# Patient Record
Sex: Female | Born: 1940 | Race: Black or African American | Hispanic: No | Smoking: Former smoker
Health system: Southern US, Community
[De-identification: ages and names within clinical notes are randomized; demographics above are authoritative.]

## PROBLEM LIST (undated history)

## (undated) DIAGNOSIS — F32A Depression, unspecified: Secondary | ICD-10-CM

## (undated) DIAGNOSIS — G43909 Migraine, unspecified, not intractable, without status migrainosus: Secondary | ICD-10-CM

## (undated) DIAGNOSIS — I10 Essential (primary) hypertension: Secondary | ICD-10-CM

## (undated) DIAGNOSIS — F329 Major depressive disorder, single episode, unspecified: Secondary | ICD-10-CM

## (undated) DIAGNOSIS — T7840XA Allergy, unspecified, initial encounter: Secondary | ICD-10-CM

## (undated) DIAGNOSIS — K219 Gastro-esophageal reflux disease without esophagitis: Secondary | ICD-10-CM

## (undated) DIAGNOSIS — M8580 Other specified disorders of bone density and structure, unspecified site: Secondary | ICD-10-CM

## (undated) HISTORY — DX: Allergy, unspecified, initial encounter: T78.40XA

## (undated) HISTORY — PX: ANKLE SURGERY: SHX546

## (undated) HISTORY — DX: Other specified disorders of bone density and structure, unspecified site: M85.80

## (undated) HISTORY — DX: Gastro-esophageal reflux disease without esophagitis: K21.9

## (undated) HISTORY — PX: COLON SURGERY: SHX602

## (undated) HISTORY — DX: Essential (primary) hypertension: I10

## (undated) HISTORY — PX: LIPOMA EXCISION: SHX5283

## (undated) HISTORY — PX: CHOLECYSTECTOMY: SHX55

## (undated) HISTORY — DX: Migraine, unspecified, not intractable, without status migrainosus: G43.909

## (undated) HISTORY — PX: HEMORRHOID SURGERY: SHX153

## (undated) HISTORY — PX: BREAST SURGERY: SHX581

## (undated) HISTORY — PX: BUNIONECTOMY: SHX129

## (undated) HISTORY — DX: Major depressive disorder, single episode, unspecified: F32.9

## (undated) HISTORY — DX: Depression, unspecified: F32.A

---

## 1977-08-21 HISTORY — PX: VAGINAL HYSTERECTOMY: SUR661

## 1998-03-30 ENCOUNTER — Ambulatory Visit (HOSPITAL_COMMUNITY): Admission: RE | Admit: 1998-03-30 | Discharge: 1998-03-30 | Payer: Self-pay | Admitting: *Deleted

## 1999-03-21 ENCOUNTER — Other Ambulatory Visit: Admission: RE | Admit: 1999-03-21 | Discharge: 1999-03-21 | Payer: Self-pay | Admitting: *Deleted

## 1999-07-27 ENCOUNTER — Ambulatory Visit (HOSPITAL_COMMUNITY): Admission: RE | Admit: 1999-07-27 | Discharge: 1999-07-27 | Payer: Self-pay | Admitting: Orthopaedic Surgery

## 1999-09-24 ENCOUNTER — Encounter: Payer: Self-pay | Admitting: Internal Medicine

## 1999-09-25 ENCOUNTER — Inpatient Hospital Stay (HOSPITAL_COMMUNITY): Admission: EM | Admit: 1999-09-25 | Discharge: 1999-10-03 | Payer: Self-pay | Admitting: Surgery

## 1999-09-25 ENCOUNTER — Encounter: Payer: Self-pay | Admitting: Internal Medicine

## 1999-09-28 ENCOUNTER — Encounter: Payer: Self-pay | Admitting: Surgery

## 1999-09-29 ENCOUNTER — Encounter: Payer: Self-pay | Admitting: Surgery

## 1999-10-11 ENCOUNTER — Ambulatory Visit (HOSPITAL_COMMUNITY): Admission: RE | Admit: 1999-10-11 | Discharge: 1999-10-11 | Payer: Self-pay

## 2000-05-29 ENCOUNTER — Other Ambulatory Visit: Admission: RE | Admit: 2000-05-29 | Discharge: 2000-05-29 | Payer: Self-pay | Admitting: *Deleted

## 2000-09-27 ENCOUNTER — Encounter: Payer: Self-pay | Admitting: *Deleted

## 2000-09-28 ENCOUNTER — Inpatient Hospital Stay (HOSPITAL_COMMUNITY): Admission: RE | Admit: 2000-09-28 | Discharge: 2000-09-30 | Payer: Self-pay | Admitting: *Deleted

## 2001-07-22 ENCOUNTER — Other Ambulatory Visit: Admission: RE | Admit: 2001-07-22 | Discharge: 2001-07-22 | Payer: Self-pay | Admitting: *Deleted

## 2001-12-13 ENCOUNTER — Encounter: Payer: Self-pay | Admitting: Interventional Cardiology

## 2001-12-13 ENCOUNTER — Inpatient Hospital Stay (HOSPITAL_COMMUNITY): Admission: EM | Admit: 2001-12-13 | Discharge: 2001-12-17 | Payer: Self-pay | Admitting: Emergency Medicine

## 2001-12-13 ENCOUNTER — Encounter: Payer: Self-pay | Admitting: Emergency Medicine

## 2001-12-16 ENCOUNTER — Encounter: Payer: Self-pay | Admitting: Cardiology

## 2001-12-30 ENCOUNTER — Ambulatory Visit (HOSPITAL_COMMUNITY): Admission: RE | Admit: 2001-12-30 | Discharge: 2001-12-30 | Payer: Self-pay | Admitting: Gastroenterology

## 2001-12-30 ENCOUNTER — Encounter: Payer: Self-pay | Admitting: Gastroenterology

## 2002-01-01 ENCOUNTER — Ambulatory Visit (HOSPITAL_COMMUNITY): Admission: RE | Admit: 2002-01-01 | Discharge: 2002-01-01 | Payer: Self-pay | Admitting: Gastroenterology

## 2002-01-16 ENCOUNTER — Encounter: Payer: Self-pay | Admitting: Surgery

## 2002-01-16 ENCOUNTER — Encounter: Admission: RE | Admit: 2002-01-16 | Discharge: 2002-01-16 | Payer: Self-pay | Admitting: Surgery

## 2002-01-16 ENCOUNTER — Encounter (INDEPENDENT_AMBULATORY_CARE_PROVIDER_SITE_OTHER): Payer: Self-pay | Admitting: *Deleted

## 2002-01-22 ENCOUNTER — Encounter: Payer: Self-pay | Admitting: Surgery

## 2002-01-27 ENCOUNTER — Inpatient Hospital Stay (HOSPITAL_COMMUNITY): Admission: RE | Admit: 2002-01-27 | Discharge: 2002-02-04 | Payer: Self-pay | Admitting: Surgery

## 2002-02-17 ENCOUNTER — Ambulatory Visit (HOSPITAL_COMMUNITY): Admission: RE | Admit: 2002-02-17 | Discharge: 2002-02-17 | Payer: Self-pay | Admitting: Surgery

## 2002-02-18 ENCOUNTER — Ambulatory Visit (HOSPITAL_BASED_OUTPATIENT_CLINIC_OR_DEPARTMENT_OTHER): Admission: RE | Admit: 2002-02-18 | Discharge: 2002-02-18 | Payer: Self-pay | Admitting: Surgery

## 2002-02-18 ENCOUNTER — Encounter: Admission: RE | Admit: 2002-02-18 | Discharge: 2002-02-18 | Payer: Self-pay | Admitting: Surgery

## 2002-02-18 ENCOUNTER — Encounter: Payer: Self-pay | Admitting: Surgery

## 2002-02-18 ENCOUNTER — Encounter (INDEPENDENT_AMBULATORY_CARE_PROVIDER_SITE_OTHER): Payer: Self-pay | Admitting: *Deleted

## 2002-04-24 ENCOUNTER — Encounter (INDEPENDENT_AMBULATORY_CARE_PROVIDER_SITE_OTHER): Payer: Self-pay | Admitting: Specialist

## 2002-04-24 ENCOUNTER — Ambulatory Visit (HOSPITAL_COMMUNITY): Admission: RE | Admit: 2002-04-24 | Discharge: 2002-04-24 | Payer: Self-pay | Admitting: Gastroenterology

## 2002-10-28 ENCOUNTER — Other Ambulatory Visit: Admission: RE | Admit: 2002-10-28 | Discharge: 2002-10-28 | Payer: Self-pay | Admitting: *Deleted

## 2003-01-19 ENCOUNTER — Encounter: Payer: Self-pay | Admitting: Emergency Medicine

## 2003-01-20 ENCOUNTER — Inpatient Hospital Stay (HOSPITAL_COMMUNITY): Admission: EM | Admit: 2003-01-20 | Discharge: 2003-01-22 | Payer: Self-pay | Admitting: Emergency Medicine

## 2003-01-20 ENCOUNTER — Encounter: Payer: Self-pay | Admitting: Internal Medicine

## 2003-01-21 ENCOUNTER — Encounter: Payer: Self-pay | Admitting: Internal Medicine

## 2003-02-26 ENCOUNTER — Encounter: Payer: Self-pay | Admitting: Surgery

## 2003-02-26 ENCOUNTER — Encounter: Admission: RE | Admit: 2003-02-26 | Discharge: 2003-02-26 | Payer: Self-pay | Admitting: Surgery

## 2003-10-11 ENCOUNTER — Emergency Department (HOSPITAL_COMMUNITY): Admission: EM | Admit: 2003-10-11 | Discharge: 2003-10-11 | Payer: Self-pay | Admitting: Emergency Medicine

## 2004-02-29 ENCOUNTER — Encounter: Admission: RE | Admit: 2004-02-29 | Discharge: 2004-02-29 | Payer: Self-pay | Admitting: Gynecology

## 2004-08-21 HISTORY — PX: BREAST EXCISIONAL BIOPSY: SUR124

## 2004-10-24 ENCOUNTER — Encounter: Admission: RE | Admit: 2004-10-24 | Discharge: 2004-10-24 | Payer: Self-pay | Admitting: Gynecology

## 2004-11-29 ENCOUNTER — Encounter: Admission: RE | Admit: 2004-11-29 | Discharge: 2004-11-29 | Payer: Self-pay | Admitting: Emergency Medicine

## 2005-03-02 ENCOUNTER — Other Ambulatory Visit: Admission: RE | Admit: 2005-03-02 | Discharge: 2005-03-02 | Payer: Self-pay | Admitting: Gynecology

## 2005-04-06 ENCOUNTER — Encounter: Admission: RE | Admit: 2005-04-06 | Discharge: 2005-04-06 | Payer: Self-pay | Admitting: Gynecology

## 2005-05-22 ENCOUNTER — Encounter: Admission: RE | Admit: 2005-05-22 | Discharge: 2005-05-22 | Payer: Self-pay | Admitting: Family Medicine

## 2005-12-28 ENCOUNTER — Encounter: Admission: RE | Admit: 2005-12-28 | Discharge: 2005-12-28 | Payer: Self-pay | Admitting: Gynecology

## 2006-08-17 ENCOUNTER — Ambulatory Visit: Payer: Self-pay | Admitting: Critical Care Medicine

## 2006-08-27 ENCOUNTER — Encounter: Admission: RE | Admit: 2006-08-27 | Discharge: 2006-08-27 | Payer: Self-pay | Admitting: Obstetrics and Gynecology

## 2006-09-05 ENCOUNTER — Ambulatory Visit: Payer: Self-pay | Admitting: Internal Medicine

## 2006-12-31 ENCOUNTER — Encounter: Admission: RE | Admit: 2006-12-31 | Discharge: 2006-12-31 | Payer: Self-pay | Admitting: Emergency Medicine

## 2007-04-12 ENCOUNTER — Ambulatory Visit: Payer: Self-pay | Admitting: Family Medicine

## 2007-06-25 ENCOUNTER — Ambulatory Visit: Payer: Self-pay | Admitting: Family Medicine

## 2007-07-03 ENCOUNTER — Ambulatory Visit: Payer: Self-pay | Admitting: Family Medicine

## 2007-07-11 ENCOUNTER — Ambulatory Visit: Payer: Self-pay | Admitting: Family Medicine

## 2007-07-17 ENCOUNTER — Encounter: Admission: RE | Admit: 2007-07-17 | Discharge: 2007-07-17 | Payer: Self-pay | Admitting: Family Medicine

## 2007-07-22 ENCOUNTER — Ambulatory Visit: Payer: Self-pay | Admitting: Family Medicine

## 2007-09-11 ENCOUNTER — Other Ambulatory Visit: Admission: RE | Admit: 2007-09-11 | Discharge: 2007-09-11 | Payer: Self-pay | Admitting: Gynecology

## 2007-10-07 ENCOUNTER — Ambulatory Visit: Payer: Self-pay | Admitting: Family Medicine

## 2007-12-06 ENCOUNTER — Ambulatory Visit: Payer: Self-pay | Admitting: Family Medicine

## 2008-02-03 ENCOUNTER — Encounter: Admission: RE | Admit: 2008-02-03 | Discharge: 2008-02-03 | Payer: Self-pay | Admitting: Gynecology

## 2008-02-19 ENCOUNTER — Ambulatory Visit: Payer: Self-pay | Admitting: Family Medicine

## 2008-04-23 ENCOUNTER — Ambulatory Visit: Payer: Self-pay | Admitting: Family Medicine

## 2008-05-22 ENCOUNTER — Ambulatory Visit: Payer: Self-pay | Admitting: Family Medicine

## 2008-08-27 ENCOUNTER — Ambulatory Visit: Payer: Self-pay | Admitting: Family Medicine

## 2008-09-04 ENCOUNTER — Encounter: Admission: RE | Admit: 2008-09-04 | Discharge: 2008-09-04 | Payer: Self-pay | Admitting: Family Medicine

## 2008-09-04 ENCOUNTER — Ambulatory Visit: Payer: Self-pay | Admitting: Family Medicine

## 2008-09-17 ENCOUNTER — Encounter: Admission: RE | Admit: 2008-09-17 | Discharge: 2008-09-17 | Payer: Self-pay | Admitting: Orthopedic Surgery

## 2008-10-07 ENCOUNTER — Ambulatory Visit: Payer: Self-pay | Admitting: Family Medicine

## 2008-11-02 ENCOUNTER — Ambulatory Visit: Payer: Self-pay | Admitting: Family Medicine

## 2008-11-10 ENCOUNTER — Ambulatory Visit (HOSPITAL_COMMUNITY): Admission: RE | Admit: 2008-11-10 | Discharge: 2008-11-11 | Payer: Self-pay | Admitting: Orthopedic Surgery

## 2009-02-04 ENCOUNTER — Encounter: Admission: RE | Admit: 2009-02-04 | Discharge: 2009-02-04 | Payer: Self-pay | Admitting: Family Medicine

## 2009-03-04 ENCOUNTER — Ambulatory Visit: Payer: Self-pay | Admitting: Gynecology

## 2009-03-04 ENCOUNTER — Other Ambulatory Visit: Admission: RE | Admit: 2009-03-04 | Discharge: 2009-03-04 | Payer: Self-pay | Admitting: Gynecology

## 2009-03-04 ENCOUNTER — Encounter: Payer: Self-pay | Admitting: Gynecology

## 2009-03-08 ENCOUNTER — Ambulatory Visit: Payer: Self-pay | Admitting: Gynecology

## 2009-03-23 ENCOUNTER — Ambulatory Visit: Payer: Self-pay | Admitting: Gynecology

## 2009-07-01 ENCOUNTER — Ambulatory Visit: Payer: Self-pay | Admitting: Family Medicine

## 2009-09-14 ENCOUNTER — Ambulatory Visit: Payer: Self-pay | Admitting: Family Medicine

## 2009-12-01 ENCOUNTER — Ambulatory Visit: Payer: Self-pay | Admitting: Physician Assistant

## 2010-02-07 ENCOUNTER — Encounter: Admission: RE | Admit: 2010-02-07 | Discharge: 2010-02-07 | Payer: Self-pay | Admitting: Gynecology

## 2010-02-07 LAB — HM MAMMOGRAPHY: HM Mammogram: NEGATIVE

## 2010-03-07 ENCOUNTER — Other Ambulatory Visit: Admission: RE | Admit: 2010-03-07 | Discharge: 2010-03-07 | Payer: Self-pay | Admitting: Gynecology

## 2010-03-07 ENCOUNTER — Ambulatory Visit: Payer: Self-pay | Admitting: Gynecology

## 2010-03-16 ENCOUNTER — Ambulatory Visit: Payer: Self-pay | Admitting: Family Medicine

## 2010-05-30 ENCOUNTER — Ambulatory Visit: Payer: Self-pay | Admitting: Gynecology

## 2010-07-29 ENCOUNTER — Ambulatory Visit: Payer: Self-pay | Admitting: Family Medicine

## 2010-09-10 ENCOUNTER — Encounter: Payer: Self-pay | Admitting: Emergency Medicine

## 2010-09-14 ENCOUNTER — Ambulatory Visit: Admit: 2010-09-14 | Payer: Self-pay | Admitting: Family Medicine

## 2010-10-13 ENCOUNTER — Ambulatory Visit (INDEPENDENT_AMBULATORY_CARE_PROVIDER_SITE_OTHER): Payer: Medicare Other | Admitting: Family Medicine

## 2010-10-13 DIAGNOSIS — K5289 Other specified noninfective gastroenteritis and colitis: Secondary | ICD-10-CM

## 2010-10-13 DIAGNOSIS — J0100 Acute maxillary sinusitis, unspecified: Secondary | ICD-10-CM

## 2010-11-17 ENCOUNTER — Ambulatory Visit (INDEPENDENT_AMBULATORY_CARE_PROVIDER_SITE_OTHER): Payer: Medicare Other | Admitting: Family Medicine

## 2010-11-17 DIAGNOSIS — Z79899 Other long term (current) drug therapy: Secondary | ICD-10-CM

## 2010-11-17 DIAGNOSIS — I10 Essential (primary) hypertension: Secondary | ICD-10-CM

## 2010-12-01 LAB — CBC
HCT: 40.2 % (ref 36.0–46.0)
Hemoglobin: 14 g/dL (ref 12.0–15.0)
MCV: 86.8 fL (ref 78.0–100.0)
Platelets: 230 10*3/uL (ref 150–400)

## 2010-12-01 LAB — BASIC METABOLIC PANEL
Calcium: 10.1 mg/dL (ref 8.4–10.5)
Chloride: 96 mEq/L (ref 96–112)
Creatinine, Ser: 0.68 mg/dL (ref 0.4–1.2)
GFR calc non Af Amer: >60 mL/min (ref >60)
Potassium: 3.8 mEq/L (ref 3.5–5.1)
Sodium: 132 mEq/L — ABNORMAL LOW (ref 135–145)

## 2010-12-01 LAB — GLUCOSE, CAPILLARY
Glucose-Capillary: 103 mg/dL — ABNORMAL HIGH (ref 70–99)
Glucose-Capillary: 106 mg/dL — ABNORMAL HIGH (ref 70–99)

## 2011-01-03 ENCOUNTER — Telehealth: Payer: Self-pay

## 2011-01-03 NOTE — Telephone Encounter (Signed)
Called [facility] Talked with Kathie Rhodes explainded that she gave the wrong info to Mrs. Clippinger that the NO one the paper was ment denied also I talk with the office manager Cordelia Pen explained my concerns because some pt here they are not Diabetic they will stop there meds and never call the DR. Cordelia Pen said she would talk with her about this .

## 2011-01-03 NOTE — Op Note (Signed)
NAMELAINY, WROBLESKI               ACCOUNT NO.:  [id number]   MEDICAL RECORD NO.:  [id number]          PATIENT TYPE:  OIB   LOCATION:  5029                         FACILITY:  [facility]   PHYSICIAN:  Burnard Bunting, M.D.    DATE OF BIRTH:  [birth year]   DATE OF PROCEDURE:  11/10/2008  DATE OF DISCHARGE:                               OPERATIVE REPORT   PREOPERATIVE DIAGNOSIS:  Right knee chondral flap.   POSTOPERATIVE DIAGNOSES:  Right knee chondromalacia and chondral flap on  the medial femoral condyle with degenerative tears in the medial and  lateral meniscus.   PROCEDURE:  Right knee diagnostic and operative arthroscopy with  chondroplasty of medial femoral condyle and debridement of degenerative  tears in the medial meniscus posterior horn and lateral meniscus  posterior horn.   SURGEON:  Burnard Bunting, MD   ASSISTANT:  None.   ANESTHESIA:  General.   INDICATIONS:  Nevaeha Finerty is a 69 year old patient with right knee  pain and locking symptoms who presents now for operative management  after failure of conservative management after I explained the risks and  benefits.   OPERATIVE FINDINGS:  1. Examination under anesthesia, range of motion 0 to 130 with      stability to varus-valgus stress.  ACL, PCL intact.  No      posterolateral rotatory instability is noted.  2. Diagnostic operative arthroscopy,  3. Grade 2-3 chondromalacia on both the sets of the patella, but with      no corresponding changes in the trochlea.  4. Multiple loose chondral fragments and loose bodies within the      joint.  5. Intact ACL, PCL.  6. Degenerative tearing of posterior and lateral meniscus with intact      articular cartilage.  7. Grade 3 chondromalacia with 50% of weightbearing surface area in      the medial femoral condyle with loose condyle flap anteriorly on      the medial femoral condyle and degenerative tearing, but the      posterior horn and the midbody of the medial meniscus  involving      about 30% of anteroposterior width of the meniscus.   PROCEDURE IN DETAIL:  The patient was brought to operating room where  general anesthetic was induced.  Preoperative antibiotics were  administered.  Right leg was prepped with DuraPrep solution including  the foot and draped in sterile manner.  Topographic anatomy was  identified including the medial and lateral margins of the patella,  medial and lateral joint line, anterior inferolateral portions  established.  The anterior and inferomedial portals were established  under direct visualization.  Diagnostic arthroscopy was performed.  Patellofemoral compartment had some mild degenerative changes, but no  loose chondral flaps and there were some multiple smaller chondral  fragment loose bodies in the medial lateral gutter, which were washed  out in the irrigating solution.  ACL and PCL were intact.  Medial  compartment demonstrated the grade 3 chondromalacia changes with loose  chondral flap on the medial femoral condyle which was debrided back to  stable a rim  using the shaver.  The patient also had an degenerative  medial meniscal tear and also debrided back using combination basket  punch and shaver.  The lateral meniscus also demonstrated that  degenerative radial tearing and fraying primarily at the posterior horn  which was debrided back around the posterior one-half circumference of  the lateral meniscus and this was involving of about 25% of the  anteroposterior width of the meniscus.  Following a meniscal and  chondral debridement, knee joint was thoroughly irrigated.  Instruments  were removed.  Portals were closed using 3-0 nylon.  Solution of  Marcaine and morphine finally injected to the knee.  The patient  tolerated the procedure well without immediate complications.      Burnard Bunting, M.D.  Electronically Signed     GSD/MEDQ  D:  11/10/2008  T:  11/11/2008  Job:  [id number]

## 2011-01-03 NOTE — Telephone Encounter (Signed)
Pt called and said that [employer] called her this morning and told her Dr.Lalonde had sent them a letter saying she was not a diabetic and wanted clarifacation I pulled pt chart and Dr.Lalone had denied the company to give her diabetic shoes . I explained that she is a diabetic and to continue her meds I told her I would call the company after I got off the phone and she Thanked me .

## 2011-01-06 ENCOUNTER — Other Ambulatory Visit: Payer: Self-pay | Admitting: Gynecology

## 2011-01-06 DIAGNOSIS — Z1231 Encounter for screening mammogram for malignant neoplasm of breast: Secondary | ICD-10-CM
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
## 2011-01-06 NOTE — Op Note (Signed)
[facility]  Patient:    Sabrina Aguirre, Sabrina Aguirre                      MRN: [id number] Proc. Date: 09/28/00 Adm. Date:  [id number] Attending:  Wetzel Bjornstad                           Operative Report  PREOPERATIVE DIAGNOSES:       1. Symptomatic rectocele.                               2. Possible enterocele.  POSTOPERATIVE DIAGNOSIS:      Symptomatic rectocele.  PROCEDURE:                    Posterior colporrhaphy.  SURGEON:                      Katy Fitch, M.D.  ASSISTANTMarcial Pacas P. Fontaine, M.D.  ANESTHESIA:                   General.  ESTIMATED BLOOD LOSS:         100 cc.  URINE OUTPUT:                 300 clear.  FLUIDS:                       1400 crystalloid.  COMPLICATIONS:                None.  SPECIMENS:                    None.  FINDINGS:                     A large rectocele without any enterocele.  Mild grade 1 cystocele.  DESCRIPTION OF PROCEDURE:     After consents were signed, the patient was taken to the operating room and administered general anesthesia.  The patient was then placed in [place] stirrups, prepped and draped in a normal sterile fashion.  A red rubber catheter was introduced into the bladder and approximately 300 cc of clear urine was returned.  Two Allis clamps were placed on the hymenal ring and Pitressin solution was injected into the posterior vaginal mucosa.  The Pitressin solution was mixed 20 units in 60 cc of saline.  A scalpel was used to incise the introitus in a transverse fashion, and Metzenbaum scissors were used to undermine the posterior vaginal mucosa.  The mucosa was then transected in the midline and the vaginal mucosal edges were held with T-clamps.  The mucosa was dissected all the way up to the vaginal cuff.  At this point, digitorectal exam was performed to attempt to delineate whether enterocele was noted.  The perirectal fascia was incised to decipher whether  enterocele sac was noted.  Again, with a finger in the rectum, no sac was able to be identified at the time of surgery.  This appeared to be a large rectocele only and, at this point, the posterior rectal fascia was reapproximated using a 3-0 Vicryl in a running interlocking stitch. The perirectal fascia was then closed using a 3-0 PDS on an SH needle in  an interrupted fashion to the introitus.  The vaginal mucosal edges were then trimmed and the mucosa was then reapproximated using 2-0 Vicryl in a running interlocking stitch.  Hemostasis was noted and the patient was taken to the recovery room in stable condition. DD:  09/28/00 TD:  09/29/00 Job: [id number]

## 2011-01-06 NOTE — Op Note (Signed)
   Sabrina Aguirre, Sabrina Aguirre                         ACCOUNT NO.:  [id number]   MEDICAL RECORD NO.:  [id number]                   PATIENT TYPE:  AMB   LOCATION:  ENDO                                 FACILITY:  [facility]   PHYSICIAN:  Charna Elizabeth, M.D.                   DATE OF BIRTH:  1941-04-25   DATE OF PROCEDURE:  04/24/2002  DATE OF DISCHARGE:  04/24/2002                                 OPERATIVE REPORT   PROCEDURE:  Colonoscopy with snare polypectomy x1.   INDICATION FOR PROCEDURE::  Rectal bleeding in a 70 year old African-  American female.  Rule out colonic polyps, masses, hemorrhoids, etc.   PREPROCEDURE PHYSICAL:  Informed consent was procured from the patient.  The  patient was fasted for eight hours prior to the procedure and prepped with a  bottle of magnesium citrate and a gallon of NuLytely the night prior to the  procedure.   PREPROCEDURE PHYSICAL:  VITAL SIGNS:  The patient had stable vital signs.  NECK:  Supple.  CHEST:  Clear to auscultation.  S1, S2 regular.  ABDOMEN:  Soft with normal bowel sounds.   DESCRIPTION OF PROCEDURE:  The patient was placed in the left lateral  decubitus position and sedated with 70 mg of Demerol and 7 mg of Versed  intravenously.  Once the patient was adequately sedate and maintained on low-  flow oxygen and continuous cardiac monitoring, the Olympus video colonoscope  was advanced into the rectum to the cecum and terminal ileum without  difficulty.  Except for small, nonbleeding internal and external hemorrhoids  and a small sessile polyp which was snared from 70 cm, no other  abnormalities were seen.  No masses or diverticulosis were present.   IMPRESSION:  1. Small, nonbleeding internal and external hemorrhoid.  2. Small sessile polyp removed from 70 cm by snare polypectomy.  3. No source of bleeding identified.   RECOMMENDATIONS:  1. Await pathology results.  2.     Avoid all nonsteroidals including aspirin.  3. A high-fiber  diet.  4. Outpatient follow-up in the next two weeks for further recommendations.                                               Charna Elizabeth, M.D.    JM/MEDQ  D:  04/25/2002  T:  04/28/2002  Job:  [id number]   cc:   Oley Balm. Georgina Pillion, M.D.  [address]  [place]  [place] [address]  Fax: [phone]
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
## 2011-01-06 NOTE — Op Note (Signed)
[place]  Patient:    Sabrina Aguirre, DISHON Visit Number: [id number] MRN: [id number]          Service Type: SUR Location: 5700 5708 [id number] Attending Physician:  Shelly Rubenstein Dictated by:   Abigail Miyamoto, M.D. Proc. Date: 01/27/02 Admit Date:  01/27/2002                             Operative Report  PREOPERATIVE DIAGNOSIS:  Biliary dyskinesia.  POSTOPERATIVE DIAGNOSIS:  Biliary dyskinesia.  PROCEDURE:  Laparoscopic converted to open cholecystectomy.  SURGEON:  Abigail Miyamoto, M.D.  ANESTHESIA:  General endotracheal anesthesia.  ESTIMATED BLOOD LOSS:  Minimal.  FINDINGS:  The patient was found to have extensive scar tissue plastering the small bowel and the stomach to the abdominal wall, making the laparoscopic procedure not possible; therefore, the patient was converted to an open procedure.  DESCRIPTION OF PROCEDURE:  The patient was brought to the operating room and identified as Sabrina Aguirre.  She was placed supine on the operating table and general anesthesia was induced.  Her abdomen was then prepped and draped in the usual sterile fashion.  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  A small incision was just made lateral to the umbilicus with a 15 blade scalpel.  The incision was carried down to the fascia, which was opened with a scalpel.  A hemostat was then used to pass to the peritoneal cavity.  The 0 Vicryl pursestring suture was laced around the fascial opening, the Hasson port was placed in the opening, and insufflation of the abdomen was begun.  Upon inserting the camera, the patient was found to have extensive adhesions of omentum, small bowel, and even the stomach to the abdominal wall. No other ports could be placed; therefore, the laparoscopic procedure was terminated.  At this point the patients midline incision was opened with a #10 scalpel by first removing the keloid.  The incision was  carried down to the fascia, which was then opened with the electrocautery.  Extensive lysis of adhesions of the omentum to the abdominal wall was taken down circumferentially.  The patient was found to have the stomach tethered to the abdominal wall and the right lobe of the liver, and this was dissected down with the electrocautery and the Metzenbaum scissors as well.  Finally the right upper quadrant could be identified and the gallbladder was identified as well.  The gallbladder was then taken down from the liver in a dome-down fashion with the electrocautery.  The cystic artery and a branch were identified and clipped several times distally and proximally before being transected with the Metzenbaum scissors.  The cystic stump was then identified and clipped twice proximally and then transected as well, completely removing the gallbladder from the field.  The liver bed was examined and hemostasis was felt to be achieved.  The abdomen was then copiously irrigated with normal saline.  Hemostasis appeared to be achieved.  Several bleeding points in the omentum were tied off with 2-0 silk ties.  At this point the midline fascia was closed with a running #1 Novofil suture.  The skin was then closed with skin staples and several 3-0 nylon at the umbilicus.  The patient tolerated the procedure well.  All sponge, needle, and instrument counts were correct at the end of the procedure.  The patient was then extubated in the operating room and taken in stable  condition to the recovery room. Dictated by:   Abigail Miyamoto, M.D. Attending Physician:  Shelly Rubenstein DD:  01/27/02 TD:  01/29/02 Job: [id number]
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
## 2011-01-06 NOTE — Cardiovascular Report (Signed)
[name], [name]                         ACCOUNT NO.:  [id number]   MEDICAL RECORD NO.:  [id number]                   PATIENT TYPE:  INP   LOCATION:  2007                                 FACILITY:  [facility]   PHYSICIAN:  Lesleigh Noe, M.D.            DATE OF BIRTH:  05-Sep-1940   DATE OF PROCEDURE:  01/21/2003  DATE OF DISCHARGE:                              CARDIAC CATHETERIZATION   PROCEDURES PERFORMED:  1. Left heart catheterization.  2. Selective coronary angiogram.  3. Left ventriculography.   CARDIOLOGIST:  Lyn Records, M.D.   INDICATIONS:  Prolonged chest pain, rule out coronary artery disease as a  source of the patient's pain.   DESCRIPTION OF PROCEDURE:  After informed consent a 6 French sheath was  placed in the right femoral artery using the modified Seldinger technique.  A 6 French #4 left Judkins catheter was used for left coronary angiography  and a 6 [place] #4 right Judkins catheter for right coronary angiography, and  had injection LV.   The patient tolerated the procedure without complications.   RESULTS:   I HEMODYNAMIC DATA:  A)  Aortic pressure 116/82.  B)  Left ventricular pressure 116/25.   II LEFT VENTRICULOGRAPHY:  The left ventricle by hand injection demonstrates  hydrodynamic contractility.  EF greater than 70.   III CORONARY ANGIOGRAPHY:  A)  Left Main Coronary:  Normal.   B)  Left Anterior Descending Coronary:  The mid and proximal LAD contain no  significant obstruction.  The LAD near the origin of the first diagonal  contains luminal irregularities, perhaps up to 25%.  The ostium of the first  diagonal (in one view) is obstructed, 40-50%.  LAD contains mid and distal  luminal irregularities and wraps around the left ventricular apex.   C)  Circumflex Artery:  The circumflex artery is large, free of any  significant and trifurcates on the left lateral wall.   D)  Right Coronary:  The right coronary is large, has minimal  luminal  irregularities and does not appear to give a very large LV branch or  posterior descending as I would expect, although there is no obvious  evidence of flush occlusion or significant vessel obstruction.   CONCLUSIONS:  1. No significant coronary disease identified.  2. Hydrodynamic left ventricular function.  3. Chest pain, etiology uncertain, but probably noncardiac unless related to     pericardium.   PLAN:  1. Two-D Doppler echocardiogram.  2. If echo is normal chest pain is likely musculoskeletal, GI or pleural.                                                 Lesleigh Noe, M.D.    HWS/MEDQ  D:  01/21/2003  T:  01/21/2003  Job:  [id number]   cc:   Oley Balm. Georgina Pillion, M.D.  [address]  [place]  [place] [address]  Fax: [phone]   Jackie Plum, M.D.  [address]  [place]  [place] [address]  Fax: [phone]
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
## 2011-01-06 NOTE — Assessment & Plan Note (Signed)
[facility]                             PULMONARY OFFICE NOTE   Sabrina Aguirre, Sabrina Aguirre                      MRN:          [id number]  DATE:09/05/2006                            DOB:          Dec 20, 1940    CHIEF COMPLAINT:  Cough.   HISTORY:  A 70 year old black female who reports a 10-year history of  recurrent cough that occurs typically in a setting of a bad cold that  occurs when she is exposed especially to sick children.  The cough then  tends to be worse when she lies down at night with minimal sputum  production that settles down after she falls asleep, and lasts up to 6  weeks.  Between these episodes, she says has no cough and denies any  significant dyspnea unless she is coughing.  She has been told before  that she has asthma, but has not responded to asthma medicines.  She  does remember receiving prednisone, and said it helped a lot.   Presently, she has had a persistent cough now for 2 months, which is the  longest she has had a cough.  She denies any sputum production, overt  sinus or reflux symptoms at present.   PAST MEDICAL HISTORY:  1. Hypertension.  Note that she is not on ACE inhibitors.  2. Remote colon surgery.  3. Hysterectomy.  4. Chronic headaches.   ALLERGIES:  NONE KNOWN.   SOCIAL HISTORY:  She quit smoking 30 years ago, and had no respiratory  problems then.  She denies any unusual travel or hobby exposure.   MEDICATIONS:  Taken in detail on the worksheet.  She uses cod liver oil.  She does use Nexium, but not before eating.   FAMILY HISTORY:  Positive only for asthma in an uncle.  Negative for  atopy otherwise or significant respiratory disease.   REVIEW OF SYSTEMS:  Also taken in detail on the worksheet.  Negative  except as outlined above.   PHYSICAL EXAMINATION:  This is a pleasant ambulatory black female, in no  acute distress.  She has stable vital signs.  HEENT:  Reveals moderate turbinate edema with  no definite purulent  secretions, polyps or cyanosis.  Oropharynx is clear with no excessive  postnasal drip or cobblestoning.  Dentition is intact.  Ear canals are  clear bilaterally.  LUNG FIELDS:  Were completely clear bilaterally to auscultation and  percussion with no cough elicited on inspiratory or expiratory  maneuvers.  There is a regular rhythm without murmur, gallop or rub present.  ABDOMEN:  Soft and benign.  EXTREMITIES:  Warm without calf tenderness, cyanosis, clubbing or edema.   Chest x-ray was ordered.   IMPRESSION:  Recurrent pattern of cough dating back over 10 years,  typically brought on by upper respiratory infection with exacerbation at  night, which strongly suggests the possibility of sinus disease or  reflux.  Since the cough is so dry and does not wake her after she is  asleep, it is more typical in my experience of reflux, and therefore, I  recommended the following:  1. Initiate Nexium  40 mg 30 to 60 minutes before her 1st and last meal      until her cough improves, then 1 daily.  2. Take 6-day course of prednisone and eliminate oil from the diet.  I      went over a reflux diet with her in detail including the avoidance      of cough drops (which she uses quite a bit of,) and replace them      with sugarless candy or ice, because menthol cough drops tend to      aggravate reflux.  If this fails to control the cough within 2      weeks, the next step is a sinus CT scan, and I have given her the      phone number to call and schedule this.     Charlaine Dalton. Sherene Sires, MD, [facility]  Electronically Signed    MBW/MedQ  DD: 09/06/2006  DT: 09/06/2006  Job #: [id number]   cc:   Emeterio Reeve, MD

## 2011-01-06 NOTE — Discharge Summary (Signed)
[facility]  Patient:    Sabrina Aguirre, Sabrina Aguirre                      MRN: [id number] Adm. Date:  [id number] Disc. Date: [id number] Attending:  Wetzel Bjornstad Dictator:   Antony Contras, N.P.                           Discharge Summary  DISCHARGE DIAGNOSIS:          Symptomatic rectocele.  PROCEDURE:                    Posterior colporrhaphy.  HISTORY OF PRESENT ILLNESS:   The patient is a 70 year old African-American female who presented to the office in December 2001 complaining of pelvic pressure which had been occurring for the past six months and progressively getting worse.  She did have a history of a previous hysterectomy in 1979, and she also thought at that time that they had performed an anterior and posterior repair.  She had her ovaries left in and has been on estrogen 0.625 mg since her last period in 1979.  She also has some complaints of urge and stress incontinence when her bladder gets too full, but does not leak on a routine basis.  She is admitted for definitive therapy.  HOSPITAL COURSE:              The patient was admitted on September 28, 2000. Posterior colporrhaphy was performed by Dr. Penni Homans assisted by Dr. Audie Box under general anesthesia.  Findings included a large rectocele, no enterocele, and a grade 1 cystocele.  Postoperatively, the patient did have some rectal discomfort and an initially elevated temperature after surgery, but this resolved spontaneously.  She was able to voiding without difficulty and was able to be discharged on her second postoperative day in satisfactory condition.  DISCHARGE FOLLOWUP:           Follow up in six weeks.  DISCHARGE MEDICATIONS:        She was also given Colace and Metamucil on discharge. DD:  11/05/00 TD:  11/06/00 Job: [id number]
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
## 2011-01-06 NOTE — Op Note (Signed)
[place]. [facility]  Patient:    Sabrina Aguirre, Sabrina Aguirre Visit Number: [id number] MRN: [id number]          Service Type: DSU Location: [facility] Attending Physician:  Shelly Rubenstein Dictated by:   Abigail Miyamoto, M.D. Proc. Date: 02/18/02 Admit Date:  02/18/2002 Discharge Date: 02/18/2002                             Operative Report  PREOPERATIVE DIAGNOSIS:  Right breast mass.  POSTOPERATIVE DIAGNOSIS:  Right breast mass.  PROCEDURE:  Needle-localized excision of right breast mass.  SURGEON:  Abigail Miyamoto, M.D.  ANESTHESIA:  1% lidocaine and monitored anesthesia care.  ESTIMATED BLOOD LOSS:  Minimal.  INDICATION:  [name] is a 70 year old female who was found on mammography to have a suspicious lesion of the right breast that cannot be palpated on exam.  Core needle biopsy was indeterminate, so therefore a needle-localized breast biopsy was recommended.  DESCRIPTION OF PROCEDURE:  The patient was brought to the operating room and identified as Sabrina Aguirre.  She was placed supine on the operating table and anesthesia was induced.  She had previously had the needle localization of the right breast done under mammography.  The right breast was then prepped and draped in the usual sterile fashion.  The skin on the medial aspect of the upper inner quadrant of the breast was anesthetized with 1% lidocaine around the area of the guidewire.  A transverse incision was then made, incorporating the wire.  The incision was carried down through the breast tissue with the scalpel.  Electrocautery was used to create inferior and superior skin flaps. A large portion of breast tissue was then excised circumferentially with electrocautery, going down to the chest wall.  The entire specimen contained the guidewire and was completely excised in full.  The specimen was then sent to radiology.  X-ray confirmed the suspicious lesion to be in the  breast specimen along with the guidewire.  The cavity was then copiously irrigated with normal saline.  Hemostasis was achieved with the cautery.  The subcutaneous layer was then closed with interrupted 3-0 Vicryl sutures.  The skin was closed with running 4-0 Monocryl.  Steri-Strips, gauze, and tape were then applied.  The patient tolerated the procedure well.  All sponge, needle, and instrument counts were correct at the end of the procedure.  The patient was then taken in stable condition from the operating room to the recovery room. Dictated by:   Abigail Miyamoto, M.D. Attending Physician:  Shelly Rubenstein DD:  02/18/02 TD:  02/20/02 Job: [id number]
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
## 2011-01-06 NOTE — Procedures (Signed)
[place]. [facility]  Patient:    Sabrina Aguirre, Sabrina Aguirre Visit Number: [id number] MRN: [id number]          Service Type: END Location: ENDO Attending Physician:  Charna Elizabeth Dictated by:   Anselmo Rod, M.D. Proc. Date: 01/01/02 Admit Date:  01/01/2002   CC:         Oley Balm. Georgina Pillion, M.D.  Abigail Miyamoto, M.D.   Procedure Report  DATE OF BIRTH:  Oct 11, 1940.  REFERRING PHYSICIAN:  Oley Balm. Georgina Pillion, M.D.  PROCEDURE PERFORMED:  Esophagogastroduodenoscopy.  ENDOSCOPIST:  Anselmo Rod, M.D.  INSTRUMENT USED:  Olympus video panendoscope.  INDICATIONS FOR PROCEDURE:  Ongoing epigastric pain in the chest, with essentially negative cardiac work-up and postprandial nausea in a 70 year old African-American female.  Rule out peptic ulcer disease, esophagitis, gastritis, etc.  PREPROCEDURE PREPARATION:  Informed consent was procured from the patient. The patient was fasted for eight hours prior to the procedure.  PREPROCEDURE PHYSICAL:  The patient had stable vital signs.  Neck supple. Chest clear to auscultation.  S1, S2 regular.  Abdomen soft with normal abdominal bowel sounds, epigastric tenderness on palpation.  DESCRIPTION OF PROCEDURE:  The patient was placed in left lateral decubitus position and sedated with 50 mg of Demerol and 5 mg of Versed intravenously. Once the patient was adequately sedated and maintained on low-flow oxygen and continuous cardiac monitoring, the Olympus video panendoscope was advanced through the mouthpiece, over the tongue, into the esophagus under direct vision.  The entire esophagus appeared normal without evidence of ring stricture, masses, esophagitis or Barretts mucosa.  The scope was then advanced into the stomach.  There was mild antral gastritis.  No ulcers, erosions, masses or polyps were seen.  The proximal bowel appeared normal as well.  IMPRESSION:  Normal esophagogastroduodenoscopy except for mild  antral gastritis.  RECOMMENDATION: 1. Continue PPIs for now. 2. Avoid all nonsteroidals including aspirin. 3. Follow-up with Dr. Abigail Miyamoto for possible laparoscopic    cholecystectomy. Dictated by:   Anselmo Rod, M.D. Attending Physician:  Charna Elizabeth DD:  01/01/02 TD:  01/02/02 Job: [id number]

## 2011-01-24 ENCOUNTER — Other Ambulatory Visit: Payer: Self-pay | Admitting: Family Medicine

## 2011-01-24 ENCOUNTER — Telehealth: Payer: Self-pay | Admitting: Family Medicine

## 2011-01-25 ENCOUNTER — Telehealth: Payer: Self-pay | Admitting: Family Medicine

## 2011-01-25 NOTE — Telephone Encounter (Signed)
Pt req Acto's 30 mg   1 qd to [facility] on [address]

## 2011-01-25 NOTE — Telephone Encounter (Signed)
This has already been done.

## 2011-01-25 NOTE — Telephone Encounter (Signed)
Handled by Dennard Nip

## 2011-02-03 ENCOUNTER — Encounter: Payer: Self-pay | Admitting: Family Medicine

## 2011-02-03 DIAGNOSIS — E669 Obesity, unspecified: Secondary | ICD-10-CM

## 2011-02-03 DIAGNOSIS — G43909 Migraine, unspecified, not intractable, without status migrainosus: Secondary | ICD-10-CM

## 2011-02-03 DIAGNOSIS — J309 Allergic rhinitis, unspecified: Secondary | ICD-10-CM | POA: Insufficient documentation

## 2011-02-03 DIAGNOSIS — K219 Gastro-esophageal reflux disease without esophagitis: Secondary | ICD-10-CM | POA: Insufficient documentation

## 2011-02-03 DIAGNOSIS — I10 Essential (primary) hypertension: Secondary | ICD-10-CM

## 2011-02-03 DIAGNOSIS — E1159 Type 2 diabetes mellitus with other circulatory complications: Secondary | ICD-10-CM | POA: Insufficient documentation

## 2011-02-03 DIAGNOSIS — K635 Polyp of colon: Secondary | ICD-10-CM | POA: Insufficient documentation

## 2011-02-10 ENCOUNTER — Ambulatory Visit
Admission: RE | Admit: 2011-02-10 | Discharge: 2011-02-10 | Disposition: A | Discharge status: Home / Self Care | Payer: Medicare Other | Source: Ambulatory Visit | Attending: Gynecology | Admitting: Gynecology

## 2011-02-10 DIAGNOSIS — Z1231 Encounter for screening mammogram for malignant neoplasm of breast: Secondary | ICD-10-CM

## 2011-02-20 ENCOUNTER — Ambulatory Visit: Payer: BC Managed Care – PPO | Admitting: Family Medicine

## 2011-02-25 ENCOUNTER — Other Ambulatory Visit: Payer: Self-pay | Admitting: Family Medicine

## 2011-02-28 ENCOUNTER — Other Ambulatory Visit: Payer: Self-pay | Admitting: Family Medicine

## 2011-03-01 ENCOUNTER — Other Ambulatory Visit: Payer: Self-pay

## 2011-03-01 ENCOUNTER — Telehealth: Payer: Self-pay | Admitting: Family Medicine

## 2011-03-01 MED ORDER — SUMATRIPTAN SUCCINATE 100 MG PO TABS: 100.0000 mg | ORAL_TABLET | ORAL | Status: DC | PRN

## 2011-03-01 NOTE — Telephone Encounter (Signed)
Dr.L wanted to have pt come in to have diabetes check told her I would call in a few imatrex for her

## 2011-03-07 ENCOUNTER — Encounter: Payer: Self-pay | Admitting: Family Medicine

## 2011-03-08 ENCOUNTER — Encounter: Payer: Self-pay | Admitting: Family Medicine

## 2011-03-08 ENCOUNTER — Ambulatory Visit (INDEPENDENT_AMBULATORY_CARE_PROVIDER_SITE_OTHER): Payer: Medicare Other | Admitting: Family Medicine

## 2011-03-08 DIAGNOSIS — E1169 Type 2 diabetes mellitus with other specified complication: Secondary | ICD-10-CM

## 2011-03-08 DIAGNOSIS — E1159 Type 2 diabetes mellitus with other circulatory complications: Secondary | ICD-10-CM

## 2011-03-08 DIAGNOSIS — G43909 Migraine, unspecified, not intractable, without status migrainosus: Secondary | ICD-10-CM

## 2011-03-08 DIAGNOSIS — I10 Essential (primary) hypertension: Secondary | ICD-10-CM

## 2011-03-08 DIAGNOSIS — E119 Type 2 diabetes mellitus without complications: Secondary | ICD-10-CM

## 2011-03-08 DIAGNOSIS — M79673 Pain in unspecified foot: Secondary | ICD-10-CM

## 2011-03-08 DIAGNOSIS — M79609 Pain in unspecified limb: Secondary | ICD-10-CM

## 2011-03-08 LAB — POCT GLYCOSYLATED HEMOGLOBIN (HGB A1C): Hemoglobin A1C: 5.5

## 2011-03-08 MED ORDER — NORTRIPTYLINE HCL 10 MG PO CAPS: 10.0000 mg | ORAL_CAPSULE | Freq: Every day | ORAL | Status: DC

## 2011-03-08 MED ORDER — SUMATRIPTAN SUCCINATE 100 MG PO TABS: 100.0000 mg | ORAL_TABLET | ORAL | Status: DC | PRN

## 2011-03-08 NOTE — Patient Instructions (Signed)
Check with her podiatrist and gets proper forms sent to me know fill out the paperwork for your shoes. Take the new medicine at night and let me know if it works cut down on the headaches and the intensity

## 2011-03-08 NOTE — Progress Notes (Signed)
  Subjective:    Patient ID: Sabrina Aguirre, female    DOB: [birth year], 70 y.o.   MRN: [id number]  HPI She is here for consult concerning migraine headaches. She states she gets roughly 15 a month. She describes him as dull the become throbbing associated with photophobia and phonophobia , nausea. She will occasionally have an or of nasal congestion and rhinorrhea. She has been seen at the headache clinic however has never been on preventative medication. She continues to do well on her diabetes care. She keeps herself busy. She does not smoke or drink. She will need an eye exam. She does check her feet fairly regularly. She apparently has had difficulty with foot pain and has seen a podiatrist. No record of that has been seen.    Review of Systems     Objective:   Physical Exam Alert and in no distress. Exam of her feet shows normal pulses, sensation with decreased reflexes. Vibratory normal.       Assessment & Plan:  Migraine headache. Diabetes. Foot pain. Discuss migraine headache it with her in detail. I will place her on nortriptyline and recommend she call me after several weeks to let me know how she is doing. Explained that I would like to have the medication decrease in number and intensity of her migraines. I will renew her Imitrex. She is also to check with a podiatrist and see her eye doctor.

## 2011-03-09 ENCOUNTER — Encounter (INDEPENDENT_AMBULATORY_CARE_PROVIDER_SITE_OTHER): Payer: Medicare Other | Admitting: Gynecology

## 2011-03-09 ENCOUNTER — Other Ambulatory Visit: Payer: Self-pay | Admitting: Gynecology

## 2011-03-09 ENCOUNTER — Other Ambulatory Visit (HOSPITAL_COMMUNITY)
Admission: RE | Admit: 2011-03-09 | Discharge: 2011-03-09 | Disposition: A | Discharge status: Home / Self Care | Payer: Medicare Other | Source: Ambulatory Visit | Attending: Gynecology | Admitting: Gynecology

## 2011-03-09 DIAGNOSIS — Z13220 Encounter for screening for lipoid disorders: Secondary | ICD-10-CM

## 2011-03-09 DIAGNOSIS — Z124 Encounter for screening for malignant neoplasm of cervix: Secondary | ICD-10-CM | POA: Insufficient documentation

## 2011-03-09 DIAGNOSIS — R82998 Other abnormal findings in urine: Secondary | ICD-10-CM

## 2011-03-09 DIAGNOSIS — R635 Abnormal weight gain: Secondary | ICD-10-CM

## 2011-03-09 DIAGNOSIS — Z01419 Encounter for gynecological examination (general) (routine) without abnormal findings: Secondary | ICD-10-CM

## 2011-03-13 ENCOUNTER — Telehealth: Payer: Self-pay | Admitting: *Deleted

## 2011-03-13 DIAGNOSIS — N390 Urinary tract infection, site not specified: Secondary | ICD-10-CM

## 2011-03-13 MED ORDER — NITROFURANTOIN MONOHYD MACRO 100 MG PO CAPS: 100.0000 mg | ORAL_CAPSULE | Freq: Two times a day (BID) | ORAL | Status: AC

## 2011-03-13 NOTE — Telephone Encounter (Signed)
PT INFORMED WITH RX FOR UTI MACROBID. RX SENT TO [facility] ON [address].

## 2011-03-16 ENCOUNTER — Telehealth: Payer: Self-pay | Admitting: *Deleted

## 2011-03-16 NOTE — Telephone Encounter (Signed)
PT CALLED WITH QUESTION RE: HER RECENT 03/09/11 OV. PT HAD A QUESTION ABOUT THE UTI RX MACROBID. ALL QUESTIONS ANSWERED. PT WILL FOLLOW UP IF ANY FURTHER ISSUES.

## 2011-03-20 ENCOUNTER — Telehealth: Payer: Self-pay | Admitting: *Deleted

## 2011-03-20 NOTE — Telephone Encounter (Signed)
Victorino Dike, please put patient's chart on Aygestin for first thing tomorrow morning for me to evaluate. I would recommend that she come by the office for urinalysis and urine culture and if she does not want to subject itself to a pelvic examination I would recommend she have a pelvic ultrasound and to see me after the ultrasound.

## 2011-03-20 NOTE — Telephone Encounter (Signed)
PT CALLING STATING UTI S/S STILL THERE. SHE HAS TOOK ALL OF MACROBID AS DIRECTED. LOWER LEFT ABDOMEN PAIN, FREQUENT URINATION. PT STATES PAIN IS DULL. IT HURTS TO BEND OVER TO TIE SHOE. I TOLD PT SHE MAY NEED A OFFICE VISIT BUT SHE STATES SHE DOES 'NT WANT AN EXAM DONE "IT IS TOO PAINFUL". PLEASE ADVISE.

## 2011-03-21 ENCOUNTER — Telehealth: Payer: Self-pay | Admitting: Family Medicine

## 2011-03-21 ENCOUNTER — Other Ambulatory Visit: Payer: Self-pay | Admitting: *Deleted

## 2011-03-21 DIAGNOSIS — N390 Urinary tract infection, site not specified: Secondary | ICD-10-CM

## 2011-03-21 NOTE — Telephone Encounter (Signed)
LM FOR PT TO CALL [id number].

## 2011-03-21 NOTE — Telephone Encounter (Signed)
PT INFORMED

## 2011-03-21 NOTE — Telephone Encounter (Signed)
PT INFORMED TO COME BY OFFICE FOR U/A & URINE CULTURE. PT WANTS TO START WITH U/A RESULTS BEFORE DOING ULTRASOUND. ORDER IN COMPUTER FOR LABS.

## 2011-03-22 ENCOUNTER — Emergency Department (HOSPITAL_COMMUNITY)
Admission: EM | Admit: 2011-03-22 | Discharge: 2011-03-22 | Disposition: A | Discharge status: Home / Self Care | Payer: Medicare Other | Attending: Emergency Medicine | Admitting: Emergency Medicine

## 2011-03-22 ENCOUNTER — Ambulatory Visit (INDEPENDENT_AMBULATORY_CARE_PROVIDER_SITE_OTHER): Payer: Medicare Other | Admitting: Gynecology

## 2011-03-22 ENCOUNTER — Emergency Department (HOSPITAL_COMMUNITY): Admission: EM | Admit: 2011-03-22 | Payer: Medicare Other | Source: Home / Self Care

## 2011-03-22 ENCOUNTER — Ambulatory Visit (HOSPITAL_COMMUNITY)
Admission: EM | Admit: 2011-03-22 | Discharge: 2011-03-22 | Disposition: A | Discharge status: Home / Self Care | Payer: Medicare Other | Source: Ambulatory Visit | Attending: Gynecology | Admitting: Gynecology

## 2011-03-22 ENCOUNTER — Other Ambulatory Visit: Payer: Self-pay | Admitting: Gynecology

## 2011-03-22 VITALS — BP 128/84

## 2011-03-22 DIAGNOSIS — I10 Essential (primary) hypertension: Secondary | ICD-10-CM | POA: Insufficient documentation

## 2011-03-22 DIAGNOSIS — M545 Low back pain, unspecified: Secondary | ICD-10-CM | POA: Insufficient documentation

## 2011-03-22 DIAGNOSIS — D3500 Benign neoplasm of unspecified adrenal gland: Secondary | ICD-10-CM | POA: Insufficient documentation

## 2011-03-22 DIAGNOSIS — Z90710 Acquired absence of both cervix and uterus: Secondary | ICD-10-CM | POA: Insufficient documentation

## 2011-03-22 DIAGNOSIS — R102 Pelvic and perineal pain: Secondary | ICD-10-CM

## 2011-03-22 DIAGNOSIS — N390 Urinary tract infection, site not specified: Secondary | ICD-10-CM | POA: Insufficient documentation

## 2011-03-22 DIAGNOSIS — R109 Unspecified abdominal pain: Secondary | ICD-10-CM

## 2011-03-22 DIAGNOSIS — E119 Type 2 diabetes mellitus without complications: Secondary | ICD-10-CM | POA: Insufficient documentation

## 2011-03-22 DIAGNOSIS — R1032 Left lower quadrant pain: Secondary | ICD-10-CM

## 2011-03-22 DIAGNOSIS — Z9089 Acquired absence of other organs: Secondary | ICD-10-CM | POA: Insufficient documentation

## 2011-03-22 DIAGNOSIS — R82998 Other abnormal findings in urine: Secondary | ICD-10-CM

## 2011-03-22 DIAGNOSIS — N949 Unspecified condition associated with female genital organs and menstrual cycle: Secondary | ICD-10-CM

## 2011-03-22 DIAGNOSIS — E7800 Pure hypercholesterolemia, unspecified: Secondary | ICD-10-CM | POA: Insufficient documentation

## 2011-03-22 DIAGNOSIS — N200 Calculus of kidney: Secondary | ICD-10-CM | POA: Insufficient documentation

## 2011-03-22 LAB — COMPREHENSIVE METABOLIC PANEL
ALT: 20 U/L (ref 0–35)
AST: 29 U/L (ref 0–37)
CO2: 26 mEq/L (ref 19–32)
Calcium: 10.3 mg/dL (ref 8.4–10.5)
Chloride: 98 mEq/L (ref 96–112)
Creatinine, Ser: 0.64 mg/dL (ref 0.50–1.10)
GFR calc Af Amer: >60 mL/min (ref >60)
GFR calc non Af Amer: >60 mL/min (ref >60)
Glucose, Bld: 80 mg/dL (ref 70–99)
Sodium: 135 mEq/L (ref 135–145)
Total Bilirubin: 0.7 mg/dL (ref 0.3–1.2)

## 2011-03-22 LAB — DIFFERENTIAL
Basophils Relative: 1 % (ref 0–1)
Lymphocytes Relative: 35 % (ref 12–46)
Monocytes Absolute: 0.5 10*3/uL (ref 0.1–1.0)
Monocytes Relative: 9 % (ref 3–12)
Neutro Abs: 3.2 10*3/uL (ref 1.7–7.7)
Neutrophils Relative %: 53 % (ref 43–77)

## 2011-03-22 LAB — CBC
HCT: 41.4 % (ref 36.0–46.0)
Hemoglobin: 13.5 g/dL (ref 12.0–15.0)
MCH: 28.5 pg (ref 26.0–34.0)
MCHC: 32.6 g/dL (ref 30.0–36.0)
RBC: 4.74 MIL/uL (ref 3.87–5.11)

## 2011-03-22 LAB — URINALYSIS, ROUTINE W REFLEX MICROSCOPIC
Bilirubin Urine: NEGATIVE
Glucose, UA: NEGATIVE mg/dL
Hgb urine dipstick: NEGATIVE
Ketones, ur: NEGATIVE mg/dL
Protein, ur: NEGATIVE mg/dL
Urobilinogen, UA: 0.2 mg/dL (ref 0.0–1.0)

## 2011-03-22 LAB — LACTIC ACID, PLASMA: Lactic Acid, Venous: 0.8 mmol/L (ref 0.5–2.2)

## 2011-03-22 LAB — URINE MICROSCOPIC-ADD ON

## 2011-03-22 NOTE — Patient Instructions (Signed)
Sabrina Aguirre, you're scheduled for your CT of the abdomen and pelvis this afternoon and  at 3:30 PM today . Please do not drink or eat anything until after the procedure.You should go into the main entrance at [facility] and take this form with you of your recent lab tests. Try to arrive approximately around 3 to 3:15 to get checked in. We'll have this result this afternoon and will contact you if you need to be seen sooner with Dr. Loreta Ave before Friday. We're working on trying to get to that appointment to see her before Friday but we will move that up if there is any acute abnormality showing up on today's CT scan

## 2011-03-22 NOTE — Progress Notes (Signed)
The patient is a 70 year old gravida 7 para 6 Ab1 who presented to the office today complaining of left lower quadrant discomfort and radiating to her side she was seen in the office on July 19 for her annual exam her urinalysis had demonstrated evidence of a urinary tract infection and was started on Macrobid for 7 days which she completed her treatment approximately 5 days ago. She denies fever chills nausea or vomiting bowel movements are normal. Her comprehensive metabolic panel, TSH, fasting lipid profile, CBC recently done was all normal. She has a history of transvaginal hysterectomy many years ago and a bladder suspension. Her last colonoscopy was in 1991.  Exam: Abdomen soft tender on the left lower abdomen and left side no rebound or guarding Pelvic: Bartholin urethra Skene was within normal limits  vagina: No gross lesions on inspection vaginal cuff intact Bimanual examination: Absence of uterus and cervix tenderness in the left lower quadrant. Rectal exam no lesions were palpated. Recent Hemoccult testing in the office was negative.  Assessment: Suspect patient may be having underlying diverticulosis. Cannot rule out diverticulitis. Although patient is afebrile. Limited bimanual examination today because of discomfort. We'll send patient for a CT of the abdomen and pelvis with and without contrast. Will check her CBC and a urine culture today. We'll have her follow up with her gastroenterologist later this week with Dr. Loreta Ave or sooner depending on the results of the CT is scheduled for later this afternoon. I have given her prescription for Tylox to take one by mouth every 4-6 hours when necessary.

## 2011-03-23 ENCOUNTER — Telehealth: Payer: Self-pay | Admitting: *Deleted

## 2011-03-23 MED ORDER — CIPROFLOXACIN HCL 250 MG PO TABS: 250.0000 mg | ORAL_TABLET | Freq: Two times a day (BID) | ORAL | Status: AC

## 2011-03-23 NOTE — Telephone Encounter (Signed)
PT INFORMED WITH THE BELOW. RX CALLED IN TO [facility]

## 2011-03-23 NOTE — Telephone Encounter (Signed)
Message copied by Aura Camps on Thu Mar 23, 2011 12:30 PM ------      Message from: Ok Edwards      Created: Thu Mar 23, 2011 10:41 AM       Victorino Dike please tell patient that I did to see the results of her CT scan yesterday that she indeed had a small kidneys stones and was referred to the emergency room I'm waiting to get that report. Her urinalysis did demonstrate she still does have a urinary tract infection  although milder than before and we will change her to a different antibiotics  I would like for herto take Cipro 250 mg one twice a day for 3 days.

## 2011-03-24 NOTE — Progress Notes (Signed)
Addended by: Richardson Chiquito on: 03/24/2011 03:45 PM   Modules accepted: Orders

## 2011-03-25 LAB — URINE CULTURE
Colony Count: 70000
Culture  Setup Time: 201208020138

## 2011-03-29 ENCOUNTER — Other Ambulatory Visit: Payer: Self-pay | Admitting: Family Medicine

## 2011-03-30 ENCOUNTER — Encounter: Payer: Self-pay | Admitting: *Deleted

## 2011-03-30 NOTE — Progress Notes (Signed)
  PT CALLED STATING SHE HAS APPOINTMENT WITH DR. MANN ON 04/13/11 TO HAVE COLONOSCOPY .

## 2011-04-04 ENCOUNTER — Ambulatory Visit (INDEPENDENT_AMBULATORY_CARE_PROVIDER_SITE_OTHER): Payer: Medicare Other | Admitting: Gynecology

## 2011-04-04 ENCOUNTER — Ambulatory Visit (INDEPENDENT_AMBULATORY_CARE_PROVIDER_SITE_OTHER): Payer: Medicare Other | Admitting: *Deleted

## 2011-04-04 DIAGNOSIS — R5381 Other malaise: Secondary | ICD-10-CM

## 2011-04-04 DIAGNOSIS — M949 Disorder of cartilage, unspecified: Secondary | ICD-10-CM

## 2011-04-04 DIAGNOSIS — R5383 Other fatigue: Secondary | ICD-10-CM | POA: Insufficient documentation

## 2011-04-04 DIAGNOSIS — M8580 Other specified disorders of bone density and structure, unspecified site: Secondary | ICD-10-CM | POA: Insufficient documentation

## 2011-04-04 DIAGNOSIS — M899 Disorder of bone, unspecified: Secondary | ICD-10-CM

## 2011-04-04 NOTE — Progress Notes (Signed)
BONE DENSITY PERFORMED 

## 2011-04-04 NOTE — Progress Notes (Signed)
Patient is a 70 year old who presented to the office today to discuss her bone density study that was done today here in the office. Patient has past history of osteopenia. She had been on antiresorptive agents for 2 years but due to the cost has discontinued approximately a year ago. She is taking adequate calcium and vitamin D. Today she was complaining of tiredness fatigue and lack of energy. Her primary physician is following her for hypertension as well as type 2 diabetes. On the office visit of July 19 patient CBC and comprehensive metabolic panel were normal. She was recently in the emergency room as a result of acute left lower abdominal pain radiating to her side and a small nonobstructive kidney stone was present which eventually patient may have passed on her own. She was not in any acute distress today. Has an appointment to see the gastroenterologists in 2 weeks since his been many years and she had a colonoscopy.  Interpretation of bone density study done agree for gynecology 04/04/2011: The region of interest was the lowest T score was a right hip with a -1.6. In comparison with study of 2010 there has been not significant decrease and bone mineralization of the right and left hip respectively and no change in the AP spine.  Assessment: Osteopenia (decrease bone mineralization) stable. Patient to continue her calcium and vitamin D as well as weightbearing exercises 3-4 times a week. Because of her tiredness and fatigue will check her TSH and her vitamin D level today. We'll repeat bone density study in 2 years and will follow for gynecological examination in one year or when necessary.

## 2011-04-05 LAB — VITAMIN D 25 HYDROXY (VIT D DEFICIENCY, FRACTURES): Vit D, 25-Hydroxy: 60 ng/mL (ref 30–89)

## 2011-04-11 ENCOUNTER — Other Ambulatory Visit: Payer: Self-pay | Admitting: Family Medicine

## 2011-05-01 ENCOUNTER — Telehealth: Payer: Self-pay | Admitting: Family Medicine

## 2011-05-01 MED ORDER — PIOGLITAZONE HCL 30 MG PO TABS: 30.0000 mg | ORAL_TABLET | Freq: Every day | ORAL | Status: DC

## 2011-05-01 NOTE — Telephone Encounter (Signed)
Needs refill Actos  She wants a generic instead of brand name  Please call pt

## 2011-05-01 NOTE — Telephone Encounter (Signed)
Patient asked for generic Actos. The appropriate box was marked

## 2011-05-09 ENCOUNTER — Encounter: Payer: Self-pay | Admitting: Family Medicine

## 2011-06-03 ENCOUNTER — Other Ambulatory Visit: Payer: Self-pay | Admitting: Family Medicine

## 2011-06-08 ENCOUNTER — Encounter: Payer: Self-pay | Admitting: Gynecology

## 2011-07-10 ENCOUNTER — Encounter: Payer: Self-pay | Admitting: Family Medicine

## 2011-07-10 ENCOUNTER — Ambulatory Visit (INDEPENDENT_AMBULATORY_CARE_PROVIDER_SITE_OTHER): Payer: Medicare Other | Admitting: Family Medicine

## 2011-07-10 VITALS — BP 122/80 | HR 72 | Wt 177.0 lb

## 2011-07-10 DIAGNOSIS — E1169 Type 2 diabetes mellitus with other specified complication: Secondary | ICD-10-CM

## 2011-07-10 DIAGNOSIS — M549 Dorsalgia, unspecified: Secondary | ICD-10-CM

## 2011-07-10 DIAGNOSIS — E785 Hyperlipidemia, unspecified: Secondary | ICD-10-CM

## 2011-07-10 DIAGNOSIS — E119 Type 2 diabetes mellitus without complications: Secondary | ICD-10-CM

## 2011-07-10 DIAGNOSIS — G43909 Migraine, unspecified, not intractable, without status migrainosus: Secondary | ICD-10-CM

## 2011-07-10 DIAGNOSIS — E1159 Type 2 diabetes mellitus with other circulatory complications: Secondary | ICD-10-CM

## 2011-07-10 DIAGNOSIS — I10 Essential (primary) hypertension: Secondary | ICD-10-CM

## 2011-07-10 LAB — LIPID PANEL
HDL: 62 mg/dL (ref >39)
LDL Cholesterol: 36 mg/dL (ref 0–99)
Total CHOL/HDL Ratio: 1.9 Ratio
Triglycerides: 84 mg/dL (ref <150)
VLDL: 17 mg/dL (ref 0–40)

## 2011-07-10 MED ORDER — PIOGLITAZONE HCL 30 MG PO TABS: 30.0000 mg | ORAL_TABLET | Freq: Every day | ORAL | Status: DC

## 2011-07-10 MED ORDER — VALSARTAN-HYDROCHLOROTHIAZIDE 320-12.5 MG PO TABS: 1.0000 | ORAL_TABLET | Freq: Every day | ORAL | Status: DC

## 2011-07-10 NOTE — Progress Notes (Signed)
  Subjective:    Patient ID: Sabrina Aguirre, female    DOB: 03-May-1941, 70 y.o.   MRN: [id number]  HPI    Review of Systems     Objective:   Physical Exam        Assessment & Plan:  An attempt was made to order an MRI however the insurance would not allow it. They recommended physical therapy first which we will set up.

## 2011-07-10 NOTE — Progress Notes (Signed)
  Subjective:    Patient ID: Sabrina Aguirre, female    DOB: [birth year], 70 y.o.   MRN: [id number]  HPI    Review of Systems     Objective:   Physical Exam        Assessment & Plan:  The request for MRI was denied. I will send her for physical therapy and reevaluate at the end of the therapy.

## 2011-07-10 NOTE — Progress Notes (Signed)
  Subjective:    Patient ID: Sabrina Aguirre, female    DOB: 04/25/1941, 70 y.o.   MRN: [id number]  HPI She is here for a recheck. She continues on medications listed in the chart presently she is on no statin do toblood work showing and no immediate in spite of her diabetes status. She does check her feet and has had an eye exam in the last month. She does not smoke or drink. Her headaches seem to be under good control. He has been having difficulty over the last several months with left flank and back pain. She has seen her gynecologist, gastroenterologist and is scheduled to see urology. Apparently there is a nonobstructing left kidney stone. Her symptoms indicate that the pain is more with motion of her back but she has had no numbness, tingling or weakness in her legs.  Review of Systems     Objective:   Physical Exam Alert and in no distress. Hemoglobin A1c is 5.8. Exam of her back shows questionable tenderness in the left SI joint area with good long or motion. Pain is made worse with left lateral motion. Hip motion normal. Negative straight leg raising. Recent CT scan did show some degenerative changes.       Assessment & Plan:   1. Back pain  MR Lumbar Spine Wo Contrast  2. Migraine headache    3. Diabetes mellitus  Lipid panel, POCT HgB A1C  4. Hyperlipidemia LDL goal <70  Lipid panel  5. Hypertension associated with diabetes     continue on present medications. Diovan and Actos was called in.

## 2011-07-12 ENCOUNTER — Other Ambulatory Visit: Payer: Medicare Other

## 2011-07-12 ENCOUNTER — Ambulatory Visit
Admission: RE | Admit: 2011-07-12 | Discharge: 2011-07-12 | Disposition: A | Discharge status: Home / Self Care | Payer: Medicare Other | Source: Ambulatory Visit | Attending: Family Medicine | Admitting: Family Medicine

## 2011-07-12 DIAGNOSIS — M549 Dorsalgia, unspecified: Secondary | ICD-10-CM

## 2011-07-17 ENCOUNTER — Encounter: Payer: Self-pay | Admitting: Family Medicine

## 2011-07-17 ENCOUNTER — Ambulatory Visit (INDEPENDENT_AMBULATORY_CARE_PROVIDER_SITE_OTHER): Payer: Medicare Other | Admitting: Family Medicine

## 2011-07-17 VITALS — BP 130/90 | HR 76

## 2011-07-17 DIAGNOSIS — M549 Dorsalgia, unspecified: Secondary | ICD-10-CM

## 2011-07-17 NOTE — Patient Instructions (Signed)
Let's to physical therapy and see how you do with this. If you continue to have difficulty, we will refer you for further evaluation and possible injections

## 2011-07-17 NOTE — Progress Notes (Signed)
  Subjective:    Patient ID: Sabrina Aguirre, female    DOB: [birth year], 70 y.o.   MRN: [id number]  HPI She is here for a consultation concerning recent MRI. The MRI did show arthritic facet changes as well as possible pathology at L3. Her main complaint is left-sided back pain that with motion but no weakness in her extremities.   Review of Systems     Objective:   Physical Exam Alert and in no distress. Pain on motion of her back. Negative straight leg raising.       Assessment & Plan:  Back pain with MRI evidence of nonspecific changes. I discussed this with her in detail. Discussed options and at this point feel that physical therapy would be the best option. If she does not respond to this, I will refer for possible injections.

## 2011-08-05 ENCOUNTER — Other Ambulatory Visit: Payer: Self-pay | Admitting: Family Medicine

## 2011-10-27 ENCOUNTER — Encounter: Payer: Self-pay | Admitting: Internal Medicine

## 2011-11-06 ENCOUNTER — Ambulatory Visit: Payer: Medicare Other | Admitting: Family Medicine

## 2011-11-13 ENCOUNTER — Ambulatory Visit (INDEPENDENT_AMBULATORY_CARE_PROVIDER_SITE_OTHER): Payer: Medicare Other | Admitting: Family Medicine

## 2011-11-13 ENCOUNTER — Encounter: Payer: Self-pay | Admitting: Family Medicine

## 2011-11-13 DIAGNOSIS — E119 Type 2 diabetes mellitus without complications: Secondary | ICD-10-CM | POA: Insufficient documentation

## 2011-11-13 DIAGNOSIS — M8580 Other specified disorders of bone density and structure, unspecified site: Secondary | ICD-10-CM

## 2011-11-13 DIAGNOSIS — J309 Allergic rhinitis, unspecified: Secondary | ICD-10-CM

## 2011-11-13 DIAGNOSIS — I10 Essential (primary) hypertension: Secondary | ICD-10-CM

## 2011-11-13 DIAGNOSIS — E1165 Type 2 diabetes mellitus with hyperglycemia: Secondary | ICD-10-CM | POA: Insufficient documentation

## 2011-11-13 DIAGNOSIS — E785 Hyperlipidemia, unspecified: Secondary | ICD-10-CM

## 2011-11-13 DIAGNOSIS — G43909 Migraine, unspecified, not intractable, without status migrainosus: Secondary | ICD-10-CM

## 2011-11-13 DIAGNOSIS — M949 Disorder of cartilage, unspecified: Secondary | ICD-10-CM

## 2011-11-13 DIAGNOSIS — M899 Disorder of bone, unspecified: Secondary | ICD-10-CM

## 2011-11-13 DIAGNOSIS — Z23 Encounter for immunization: Secondary | ICD-10-CM

## 2011-11-13 NOTE — Patient Instructions (Signed)
Keep taking good care of yourself 

## 2011-11-13 NOTE — Progress Notes (Signed)
  Subjective:    Patient ID: Sabrina Aguirre, female    DOB: 09-May-1941, 71 y.o.   MRN: [id number]  HPI She is here for a diabetes recheck. She continues on medications listed in the chart. She does check her sugars several times per week and they are always below 100. She does not smoke or drink. She does have a history of migraines that usually occurs during weather changes. She does take a multivitamin as well as vitamin D and calcium. She keeps himself busy. Her allergies are under good control. She had an eye exam in January and does check her feet periodically.   Review of Systems     Objective:   Physical Exam Alert and in no distress. Hemoglobin A1c is 5.6      Assessment & Plan:   1. Diabetes mellitus  POCT HgB A1C  2. Allergic rhinitis    3. HTN (hypertension)    4. Migraine headache    5. Hyperlipidemia LDL goal <70    6. Osteopenia of the elderly     I encouraged her to continue to take good care of herself, stay on a baby aspirin and multivitamin.

## 2011-12-10 ENCOUNTER — Other Ambulatory Visit: Payer: Self-pay | Admitting: Family Medicine

## 2012-01-03 ENCOUNTER — Ambulatory Visit: Payer: BC Managed Care – PPO | Admitting: Family Medicine

## 2012-01-04 ENCOUNTER — Ambulatory Visit (INDEPENDENT_AMBULATORY_CARE_PROVIDER_SITE_OTHER): Payer: Medicare Other | Admitting: Family Medicine

## 2012-01-04 ENCOUNTER — Encounter: Payer: Self-pay | Admitting: Family Medicine

## 2012-01-04 VITALS — BP 130/80 | HR 83 | Wt 176.0 lb

## 2012-01-04 DIAGNOSIS — Z79899 Other long term (current) drug therapy: Secondary | ICD-10-CM

## 2012-01-04 DIAGNOSIS — M8580 Other specified disorders of bone density and structure, unspecified site: Secondary | ICD-10-CM

## 2012-01-04 DIAGNOSIS — G43909 Migraine, unspecified, not intractable, without status migrainosus: Secondary | ICD-10-CM

## 2012-01-04 DIAGNOSIS — I10 Essential (primary) hypertension: Secondary | ICD-10-CM

## 2012-01-04 DIAGNOSIS — E119 Type 2 diabetes mellitus without complications: Secondary | ICD-10-CM

## 2012-01-04 DIAGNOSIS — M949 Disorder of cartilage, unspecified: Secondary | ICD-10-CM

## 2012-01-04 LAB — COMPREHENSIVE METABOLIC PANEL
BUN: 14 mg/dL (ref 6–23)
CO2: 27 mEq/L (ref 19–32)
Glucose, Bld: 83 mg/dL (ref 70–99)
Sodium: 141 mEq/L (ref 135–145)
Total Bilirubin: 0.5 mg/dL (ref 0.3–1.2)
Total Protein: 6.9 g/dL (ref 6.0–8.3)

## 2012-01-04 NOTE — Patient Instructions (Signed)
Go ahead and start taking the Imitrex twice per day regularly

## 2012-01-04 NOTE — Progress Notes (Signed)
  Subjective:    Patient ID: Sabrina Aguirre, female    DOB: September 10, 1940, 71 y.o.   MRN: [id number]  HPI She has a long history of difficulty with migraine headaches usually having 2 or 3 per month. They do respond quite nicely to Imitrex. She does have an aura of nasal congestion followed by left-sided headache, dizziness, throbbing sensation usually unilateral with photophobia and weakness. She recently saw her eye doctor and was told things were well. She notes in the last 3 weeks the headaches have gotten more severe and are occurring daily. They're not responding as well to Imitrex. She does have underlying diabetes and states her blood sugars are running quite well. He continues on multiple over-the-counter medications including vitamin D. She does have a previous history of osteopenia.   Review of Systems     Objective:   Physical Exam alert and in no distress. EOMI. Other cranial nerves intact. Tympanic membranes and canals are normal. Throat is clear. Tonsils are normal. Neck is supple without adenopathy or thyromegaly. Cardiac exam shows a regular sinus rhythm without murmurs or gallops. Lungs are clear to auscultation. DTRs are normal.        Assessment & Plan:   1. Osteopenia of the elderly  Vitamin D 25 hydroxy  2. Migraine headache  MR Brain W Wo Contrast  3. HTN (hypertension)    4. Diabetes mellitus  Comprehensive metabolic panel  5. Encounter for long-term (current) use of other medications  Vitamin D 25 hydroxy, Comprehensive metabolic panel

## 2012-01-09 ENCOUNTER — Ambulatory Visit
Admission: RE | Admit: 2012-01-09 | Discharge: 2012-01-09 | Disposition: A | Discharge status: Home / Self Care | Payer: BC Managed Care – PPO | Source: Ambulatory Visit | Attending: Family Medicine | Admitting: Family Medicine

## 2012-01-09 DIAGNOSIS — G43909 Migraine, unspecified, not intractable, without status migrainosus: Secondary | ICD-10-CM

## 2012-01-09 MED ORDER — GADOBENATE DIMEGLUMINE 529 MG/ML IV SOLN
15.0000 mL | Freq: Once | INTRAVENOUS | Status: AC | PRN
  Administered 2012-01-09: 15 mL via INTRAVENOUS

## 2012-01-18 ENCOUNTER — Ambulatory Visit (INDEPENDENT_AMBULATORY_CARE_PROVIDER_SITE_OTHER): Payer: Medicare Other | Admitting: Family Medicine

## 2012-01-18 ENCOUNTER — Encounter: Payer: Self-pay | Admitting: Family Medicine

## 2012-01-18 VITALS — BP 150/90 | HR 89 | Wt 174.0 lb

## 2012-01-18 DIAGNOSIS — E119 Type 2 diabetes mellitus without complications: Secondary | ICD-10-CM

## 2012-01-18 DIAGNOSIS — G43909 Migraine, unspecified, not intractable, without status migrainosus: Secondary | ICD-10-CM

## 2012-01-18 MED ORDER — PIOGLITAZONE HCL 30 MG PO TABS: 30.0000 mg | ORAL_TABLET | Freq: Every day | ORAL | Status: DC

## 2012-01-18 MED ORDER — AMITRIPTYLINE HCL 10 MG PO TABS: 10.0000 mg | ORAL_TABLET | Freq: Every day | ORAL | Status: DC

## 2012-01-18 NOTE — Patient Instructions (Signed)
Take the pill every night but if you feel sedated all day long let me know. I want this to cut down on the number and intensity of your migraine headaches so call me in a week and if there is no change I will double the dose.

## 2012-01-18 NOTE — Progress Notes (Signed)
  Subjective:    Patient ID: Sabrina Aguirre, female    DOB: 10-14-1940, 71 y.o.   MRN: [id number]  HPI She is here for consultation. Recent MRI scan was negative. She did not take the Imitrex as prescribed due to cost. She has continued had difficulty with her migraine headaches. She also will need a refill on her Actos.   Review of Systems     Objective:   Physical Exam  alert and in no distress otherwise not examined       Assessment & Plan:   1. Migraine headache  amitriptyline (ELAVIL) 10 MG tablet  2. Diabetes mellitus type 2, noninsulin dependent     I discussed the diagnosis and the negative CT scan. I will place her on amitriptyline. Discussed possible side effects especially with sedation and her age. She is comfortable with this. She is also to use Imitrex as needed. She will call me in one week and let me know how this is working.

## 2012-01-23 ENCOUNTER — Other Ambulatory Visit: Payer: Self-pay | Admitting: Gynecology

## 2012-01-23 DIAGNOSIS — Z1231 Encounter for screening mammogram for malignant neoplasm of breast: Secondary | ICD-10-CM

## 2012-03-13 ENCOUNTER — Ambulatory Visit
Admission: RE | Admit: 2012-03-13 | Discharge: 2012-03-13 | Disposition: A | Discharge status: Home / Self Care | Payer: Medicare Other | Source: Ambulatory Visit | Attending: Gynecology | Admitting: Gynecology

## 2012-03-13 DIAGNOSIS — Z1231 Encounter for screening mammogram for malignant neoplasm of breast: Secondary | ICD-10-CM

## 2012-03-15 ENCOUNTER — Ambulatory Visit (INDEPENDENT_AMBULATORY_CARE_PROVIDER_SITE_OTHER): Payer: Medicare Other | Admitting: Women's Health

## 2012-03-15 ENCOUNTER — Encounter: Payer: Self-pay | Admitting: Women's Health

## 2012-03-15 VITALS — BP 168/100 | Ht 64.75 in | Wt 174.0 lb

## 2012-03-15 DIAGNOSIS — Z01419 Encounter for gynecological examination (general) (routine) without abnormal findings: Secondary | ICD-10-CM

## 2012-03-15 NOTE — Patient Instructions (Addendum)
Zostavax (shingles vaccine). Pneumovax (pneumonia vaccine).Health Recommendations for Postmenopausal Women Based on the Results of the Women's Health Initiative [facility]) and Other Studies The WHI is a major 15-year research program to address the most common causes of death, disability and poor quality of life in postmenopausal women. Some of these causes are heart disease, cancer, bone loss (osteoporosis) and others. Taking into account all of the findings from [facility] and other studies, here are bottom-line health recommendations for women: CARDIOVASCULAR DISEASE Heart Disease: A heart attack is a medical emergency. Know the signs and symptoms of a heart attack. Hormone therapy should not be used to prevent heart disease. In women with heart disease, hormone therapy should not be used to prevent further disease. Hormone therapy increases the risk of blood clots. Below are things women can do to reduce their risk for heart disease.   Do not smoke. If you smoke, quit. Women who smoke are 2 to 6 times more likely to suffer a heart attack than non-smoking women.   Aim for a healthy weight. Being overweight causes many preventable deaths. Eat a healthy and balanced diet and drink an adequate amount of liquids.   Get moving. Make a commitment to be more physically active. Aim for 30 minutes of activity on most, if not all days of the week.   Eat for heart health. Choose a diet that is low in saturated fat, trans fat, and cholesterol. Include whole grains, vegetables, and fruits. Read the labels on the food container before buying it.   Know your numbers. Ask your caregiver to check your blood pressure, cholesterol (total, HDL, LDL, triglycerides) and blood glucose. Work with your caregiver to improve any numbers that are not normal.   High blood pressure. Limit or stop your table salt intake (try salt substitute and food seasonings), avoid salty foods and drinks. Read the labels on the food container before  buying it. Avoid becoming overweight by eating well and exercising.  STROKE  Stroke is a medical emergency. Stroke can be the result of a blood clot in the blood vessel in the brain or by a brain hemorrhage (bleeding). Know the signs and symptoms of a stroke. To lower the risk of developing a stroke:  Avoid fatty foods.   Quit smoking.   Control your diabetes, blood pressure, and irregular heart rate.  THROMBOPHLIBITIS (BLOOD CLOT) OF THE LEG  Hormone treatment is a big cause of developing blood clots in the leg. Becoming overweight and leading a stationary lifestyle also may contribute to developing blood clots. Controlling your diet and exercising will help lower the risk of developing blood clots. CANCER SCREENING  Breast Cancer: Women should take steps to reduce their risk of breast cancer. This includes having regular mammograms, monthly self breast exams and regular breast exams by your caregiver. Have a mammogram every one to two years if you are 67 to 72 years old. Have a mammogram annually if you are 21 years old or older depending on your risk factors. Women who are high risk for breast cancer may need more frequent mammograms. There are tests available (testing the genes in your body) if you have family history of breast cancer called BRCA 1 and 2. These tests can help determine the risks of developing breast cancer.   Intestinal or Stomach Cancer: Women should talk to their caregiver about when to start screening, what tests and how often they should be done, and the benefits and risks of doing these tests. Tests to consider  are a rectal exam, fecal occult blood, sigmoidoscopy, colononoscoby, barium enema and upper GI series of the stomach. Depending on the age, you may want to get a medical and family history of colon cancer. Women who are high risk may need to be screened at an earlier age and more often.   Cervical Cancer: A Pap test of the cervix should be done every year and every 3  years when there has been three straight years of a normal Pap test. Women with an abnormal Pap test should be screened more often or have a cervical biopsy depending on your caregiver's recommendation.   Uterine Cancer: If you have vaginal bleeding after you are in the menopause, it should be evaluated by your caregiver.   Ovarian cancer: There are no reliable tests available to screen for ovarian cancer at this time except for yearly pelvic exams.   Lung Cancer: Yearly chest X-rays can detect lung cancer and should be done on high risk women, such as cigarette smokers and women with chronic lung disease (emphysemia).   Skin Cancer: A complete body skin exam should be done at your yearly examination. Avoid overexposure to the sun and ultraviolet light lamps. Use a strong sun block cream when in the sun. All of these things are important in lowering the risk of skin cancer.  MENOPAUSE Menopause Symptoms: Hormone therapy products are effective for treating symptoms associated with menopause:  Moderate to severe hot flashes.   Night sweats.   Mood swings.   Headaches.   Tiredness.   Loss of sex drive.   Insomnia.   Other symptoms.  However, hormone therapy products carry serious risks, especially in older women. Women who use or are thinking about using estrogen or estrogen with progestin treatments should discuss that with their caregiver. Your caregiver will know if the benefits outweigh the risks. The Food and Drug Administration (FDA) has concluded that hormone therapy should be used only at the lowest doses and for the shortest amount of time to reach treatment goals. It is not known at what doses there may be less risk of serious side effects. There are other treatments such as herbal medication (not controlled or regulated by the FDA), group therapy, counseling and acupuncture that may be helpful. OSTEOPOROSIS Protecting Against Bone Loss and Preventing Fracture: If hormone therapy  is used for prevention of bone loss (osteoporosis), the risks for bone loss must outweigh the risk of the therapy. Women considering taking hormone therapy for bone loss should ask their health care providers about other medications (fosamax and boniva) that are considered safe and effective for preventing bone loss and bone fractures. To guard against bone loss or fractures, it is recommended that women should take at least 1000-1500 mg of calcium and 400-800 IU of vitamin D daily in divided doses. Smoking and excessive alcohol intake increases the risk of osteoporosis. Eat foods rich in calcium and vitamin D and do weight bearing exercises several times a week as your caregiver suggests. DIABETES Diabetes Melitus: Women with Type I or Type 2 diabetes should keep their diabetes in control with diet, exercise and medication. Avoid too many sweets, starchy and fatty foods. Being overweight can affect your diabetes. COGNITION AND MEMORY Cognition and Memory: Menopausal hormone therapy is not recommended for the prevention of cognitive disorders such as Alzheimer's disease or memory loss. WHI found that women treated with hormone therapy have a greater risk of developing dementia.  DEPRESSION  Depression may occur at any age, but  is common in elderly women. The reasons may be because of physical, medical, social (loneliness), financial and/or economic problems and needs. Becoming involved with church, volunteer or social groups, seeking treatment for any physical or medical problems is recommended. Also, look into getting professional advice for any economic or financial problems. ACCIDENTS  Accidents are common and can be serious in the elderly woman. Prepare your house to prevent accidents. Eliminate throw rugs, use hip protectors, place hand bars in the bath, shower and toilet areas. Avoid wearing high heel shoes and walking on wet, snowy and icy areas. Stop driving if you have vision, hearing problems or  are unsteady with you movements and reflexes. RHEUMATOID ARTHRITIS Rheumatoid arthritis causes pain, swelling and stiffness of your bone joints. It can limit many of your activities. Over-the-counter medications may help, but prescription medications may be necessary. Talk with your caregiver about this. Exercise (walking, water aerobics), good posture, using splints on painful joints, warm baths or applying warm compresses to stiff joints and cold compresses to painful joints may be helpful. Smoking and excessive drinking may worsen the symptoms of arthritis. Seek help from a physical therapist if the arthritis is becoming a problem with your daily activities. IMMUNIZATIONS  Several immunizations are important to have during your senior years, including:   Tetanus and a diptheria shot booster every 10 years.   Influenza every year before the flu season begins.   Pneumonia vaccine.   Shingles vaccine.   Others as indicated (example: H1N1 vaccine).  Document Released: 09/29/2005 Document Revised: 07/27/2011 Document Reviewed: 05/25/2008 [facility] Patient Information 2012 [place], [place].

## 2012-03-15 NOTE — Progress Notes (Signed)
Sabrina Aguirre August 13, 1941 [id number]    History:    The patient presents for breast and pelvic exam. Postmenopausal/no vaginal bleeding/hx HRT,not recent/not sexually active. Partial vaginal hysterectomy. Osteopenia/DEXA 2012 T score -1.6 at hip. Hx normal Paps. Recent mammogram/hx benign lumpectomy. Colonoscopy 2013 negative/hx benign polyps in previous colonoscopy. No Zostavax or pneumovax. Hx HTN/type II DM-meds/labs per primary care. Reports dry cough for past 2 months, denies fever or chest pain.    Past medical history, past surgical history, family history and social history were all reviewed and documented in the EPIC chart. 6 children, 10 grandchildren with 1 due in August 2013. Separated X 23 years. Retired Engineer, site.   ROS:  A  ROS was performed and pertinent positives and negatives are included in the history.  Exam:  Filed Vitals:   03/15/12 0910  BP: 168/100    General appearance:  Normal Head/Neck:  Normal, without cervical or supraclavicular adenopathy. Thyroid:  Symmetrical, normal in size, without palpable masses or nodularity. Respiratory  Effort:  Normal  Auscultation:  Clear without wheezing or rhonchi Cardiovascular  Auscultation:  Regular rate, without rubs, murmurs or gallops  Edema/varicosities:  Not grossly evident Abdominal  Soft,nontender, without masses, guarding or rebound.  Liver/spleen:  No organomegaly noted  Hernia:  None appreciated  Skin  Inspection:  Grossly normal  Palpation:  Grossly normal Neurologic/psychiatric  Orientation:  Normal with appropriate conversation.  Mood/affect:  Normal  Genitourinary    Breasts: Examined lying and sitting.     Right: Without masses, retractions, discharge or axillary adenopathy.     Left: Without masses, retractions, discharge or axillary adenopathy.   Inguinal/mons:  Normal without inguinal adenopathy  External genitalia:  Normal  BUS/Urethra/Skene's glands:  Normal  Bladder:   Normal  Vagina:  Normal  Cervix:  Absent.  Uterus:  Absent.  Adnexa/parametria:     Rt: Without masses or tenderness.   Lt: Without masses or tenderness.  Anus and perineum: Normal  Digital rectal exam: Normal sphincter tone without palpated masses or tenderness  Assessment/Plan:  71 y.o. DBF G7P6 for breast and pelvic exam with complaints of dry cough X 2 months.  Normal postmenopausal exam Osteopenia/DEXA 2012-T score -1.6 at hip 2012 HTN/Type II DM-meds/labs per primary care  Plan: Labs per primary care. No pap, guidelines reviewed. Encouraged continued yearly mammograms and SBEs. Vit. D 2000 IU daily, home safety and fall precautions reviewed.Continue daily walks.  BP elevated at 168/100, has not taken Diovan today, checks BP regularly at home/normal per patient, states BP is always high here, encouraged follow up with PC if remains elevated and if cough continues. Zostavax and Pneumovax encouraged at [facility].     Harrington Challenger [facility], 9:47 AM 03/15/2012

## 2012-03-18 ENCOUNTER — Ambulatory Visit: Payer: BC Managed Care – PPO | Admitting: Family Medicine

## 2012-03-27 ENCOUNTER — Ambulatory Visit (INDEPENDENT_AMBULATORY_CARE_PROVIDER_SITE_OTHER): Payer: Medicare Other | Admitting: Family Medicine

## 2012-03-27 ENCOUNTER — Encounter: Payer: Self-pay | Admitting: Family Medicine

## 2012-03-27 VITALS — BP 130/80 | HR 93 | Wt 171.0 lb

## 2012-03-27 DIAGNOSIS — E669 Obesity, unspecified: Secondary | ICD-10-CM

## 2012-03-27 DIAGNOSIS — E119 Type 2 diabetes mellitus without complications: Secondary | ICD-10-CM

## 2012-03-27 DIAGNOSIS — I10 Essential (primary) hypertension: Secondary | ICD-10-CM

## 2012-03-27 DIAGNOSIS — G43909 Migraine, unspecified, not intractable, without status migrainosus: Secondary | ICD-10-CM

## 2012-03-27 DIAGNOSIS — K219 Gastro-esophageal reflux disease without esophagitis: Secondary | ICD-10-CM

## 2012-03-27 DIAGNOSIS — J309 Allergic rhinitis, unspecified: Secondary | ICD-10-CM

## 2012-03-27 MED ORDER — SUMATRIPTAN SUCCINATE 100 MG PO TABS: 100.0000 mg | ORAL_TABLET | ORAL | Status: DC | PRN

## 2012-03-27 NOTE — Progress Notes (Signed)
  Subjective:    Patient ID: Sabrina Aguirre, female    DOB: 1941/01/18, 71 y.o.   MRN: [id number]  HPI She is here for recheck. Her migraines are under much better control since she started the amitriptyline. She would like a refill on her Imitrex. Her allergies are under good control. Her reflux gets her very little difficulty. She continues on medications listed in the chart. She does exercise fairly regularly. She does need an eye exam.   Review of Systems     Objective:   Physical Exam Alert and in no distress. Globin A1c is 5.5.      Assessment & Plan:   1. Migraine headache    2. Diabetes mellitus  POCT glycosylated hemoglobin (Hb A1C)  3. Allergic rhinitis    4. GERD (gastroesophageal reflux disease)    5. HTN (hypertension)    6. Diabetes mellitus type 2, noninsulin dependent    7. Obesity     I encouraged her to continue with her very active lifestyle and present medication regimen.

## 2012-05-06 ENCOUNTER — Other Ambulatory Visit: Payer: Self-pay | Admitting: Family Medicine

## 2012-05-07 ENCOUNTER — Other Ambulatory Visit: Payer: Self-pay

## 2012-05-07 MED ORDER — METFORMIN HCL 500 MG PO TABS: 500.0000 mg | ORAL_TABLET | Freq: Two times a day (BID) | ORAL | Status: DC

## 2012-06-03 ENCOUNTER — Telehealth: Payer: Self-pay

## 2012-06-03 NOTE — Telephone Encounter (Signed)
I had previously called on 05-07-12 left the RX on machine just called in again pt is aware

## 2012-06-04 ENCOUNTER — Other Ambulatory Visit: Payer: Self-pay | Admitting: Family Medicine

## 2012-06-15 ENCOUNTER — Emergency Department (HOSPITAL_BASED_OUTPATIENT_CLINIC_OR_DEPARTMENT_OTHER)
Admission: EM | Admit: 2012-06-15 | Discharge: 2012-06-15 | Disposition: A | Discharge status: Home / Self Care | Payer: Medicare Other | Attending: Emergency Medicine | Admitting: Emergency Medicine

## 2012-06-15 ENCOUNTER — Encounter (HOSPITAL_BASED_OUTPATIENT_CLINIC_OR_DEPARTMENT_OTHER): Payer: Self-pay | Admitting: Student

## 2012-06-15 DIAGNOSIS — E119 Type 2 diabetes mellitus without complications: Secondary | ICD-10-CM | POA: Insufficient documentation

## 2012-06-15 DIAGNOSIS — W57XXXA Bitten or stung by nonvenomous insect and other nonvenomous arthropods, initial encounter: Secondary | ICD-10-CM

## 2012-06-15 DIAGNOSIS — M899 Disorder of bone, unspecified: Secondary | ICD-10-CM | POA: Insufficient documentation

## 2012-06-15 DIAGNOSIS — Z8669 Personal history of other diseases of the nervous system and sense organs: Secondary | ICD-10-CM | POA: Insufficient documentation

## 2012-06-15 DIAGNOSIS — Y9389 Activity, other specified: Secondary | ICD-10-CM | POA: Insufficient documentation

## 2012-06-15 DIAGNOSIS — Y9289 Other specified places as the place of occurrence of the external cause: Secondary | ICD-10-CM | POA: Insufficient documentation

## 2012-06-15 DIAGNOSIS — Z79899 Other long term (current) drug therapy: Secondary | ICD-10-CM | POA: Insufficient documentation

## 2012-06-15 DIAGNOSIS — I10 Essential (primary) hypertension: Secondary | ICD-10-CM | POA: Insufficient documentation

## 2012-06-15 DIAGNOSIS — K219 Gastro-esophageal reflux disease without esophagitis: Secondary | ICD-10-CM | POA: Insufficient documentation

## 2012-06-15 DIAGNOSIS — S60569A Insect bite (nonvenomous) of unspecified hand, initial encounter: Secondary | ICD-10-CM | POA: Insufficient documentation

## 2012-06-15 DIAGNOSIS — Z7982 Long term (current) use of aspirin: Secondary | ICD-10-CM | POA: Insufficient documentation

## 2012-06-15 DIAGNOSIS — F329 Major depressive disorder, single episode, unspecified: Secondary | ICD-10-CM | POA: Insufficient documentation

## 2012-06-15 DIAGNOSIS — F3289 Other specified depressive episodes: Secondary | ICD-10-CM | POA: Insufficient documentation

## 2012-06-15 MED ORDER — PREDNISONE 50 MG PO TABS
60.0000 mg | ORAL_TABLET | Freq: Once | ORAL | Status: AC
  Administered 2012-06-15: 60 mg via ORAL
  Filled 2012-06-15: qty 1

## 2012-06-15 MED ORDER — IBUPROFEN 400 MG PO TABS
400.0000 mg | ORAL_TABLET | Freq: Once | ORAL | Status: AC
  Administered 2012-06-15: 400 mg via ORAL
  Filled 2012-06-15: qty 1

## 2012-06-15 MED ORDER — PREDNISONE 20 MG PO TABS: 40.0000 mg | ORAL_TABLET | Freq: Every day | ORAL | Status: DC

## 2012-06-15 NOTE — ED Notes (Signed)
Bug bite to left hand, reports pain to site
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
## 2012-07-13 ENCOUNTER — Other Ambulatory Visit: Payer: Self-pay | Admitting: Family Medicine

## 2012-07-15 ENCOUNTER — Telehealth: Payer: Self-pay | Admitting: Family Medicine

## 2012-07-15 MED ORDER — SUMATRIPTAN SUCCINATE 100 MG PO TABS: 100.0000 mg | ORAL_TABLET | ORAL | Status: DC | PRN

## 2012-07-15 MED ORDER — PIOGLITAZONE HCL 30 MG PO TABS: 30.0000 mg | ORAL_TABLET | Freq: Every day | ORAL | Status: DC

## 2012-07-15 NOTE — Telephone Encounter (Signed)
Dr.Lalonde Had already sent in diovan i sent in imatrex and actos

## 2012-07-26 ENCOUNTER — Ambulatory Visit: Payer: Medicare Other | Admitting: Family Medicine

## 2012-10-24 ENCOUNTER — Telehealth: Payer: Self-pay | Admitting: Internal Medicine

## 2012-10-24 MED ORDER — PIOGLITAZONE HCL 30 MG PO TABS: 30.0000 mg | ORAL_TABLET | Freq: Every day | ORAL | Status: DC

## 2012-10-24 MED ORDER — SUMATRIPTAN SUCCINATE 100 MG PO TABS: 100.0000 mg | ORAL_TABLET | ORAL | Status: DC | PRN

## 2012-10-24 MED ORDER — METFORMIN HCL 500 MG PO TABS: 500.0000 mg | ORAL_TABLET | Freq: Two times a day (BID) | ORAL | Status: DC

## 2012-10-24 NOTE — Telephone Encounter (Signed)
Sent med in 

## 2012-11-22 ENCOUNTER — Encounter: Payer: Self-pay | Admitting: Family Medicine

## 2012-11-22 ENCOUNTER — Ambulatory Visit (INDEPENDENT_AMBULATORY_CARE_PROVIDER_SITE_OTHER): Payer: Medicare PPO | Admitting: Family Medicine

## 2012-11-22 VITALS — BP 122/80 | HR 85 | Temp 98.7°F | Wt 170.0 lb

## 2012-11-22 DIAGNOSIS — G43909 Migraine, unspecified, not intractable, without status migrainosus: Secondary | ICD-10-CM

## 2012-11-22 DIAGNOSIS — Z79899 Other long term (current) drug therapy: Secondary | ICD-10-CM

## 2012-11-22 DIAGNOSIS — J309 Allergic rhinitis, unspecified: Secondary | ICD-10-CM

## 2012-11-22 DIAGNOSIS — I10 Essential (primary) hypertension: Secondary | ICD-10-CM

## 2012-11-22 DIAGNOSIS — E1169 Type 2 diabetes mellitus with other specified complication: Secondary | ICD-10-CM | POA: Insufficient documentation

## 2012-11-22 DIAGNOSIS — E785 Hyperlipidemia, unspecified: Secondary | ICD-10-CM

## 2012-11-22 DIAGNOSIS — E119 Type 2 diabetes mellitus without complications: Secondary | ICD-10-CM

## 2012-11-22 LAB — LIPID PANEL
Cholesterol: 110 mg/dL (ref 0–200)
HDL: 62 mg/dL (ref >39)
Total CHOL/HDL Ratio: 1.8 Ratio
Triglycerides: 100 mg/dL (ref <150)

## 2012-11-22 LAB — COMPREHENSIVE METABOLIC PANEL
ALT: 13 U/L (ref 0–35)
CO2: 27 mEq/L (ref 19–32)
Creat: 0.76 mg/dL (ref 0.50–1.10)
Glucose, Bld: 86 mg/dL (ref 70–99)
Total Bilirubin: 0.4 mg/dL (ref 0.3–1.2)

## 2012-11-22 LAB — CBC WITH DIFFERENTIAL/PLATELET
Eosinophils Absolute: 0.1 10*3/uL (ref 0.0–0.7)
Eosinophils Relative: 2 % (ref 0–5)
Lymphs Abs: 1.2 10*3/uL (ref 0.7–4.0)
MCH: 28 pg (ref 26.0–34.0)
MCV: 84.5 fL (ref 78.0–100.0)
Platelets: 185 10*3/uL (ref 150–400)
RBC: 4.64 MIL/uL (ref 3.87–5.11)

## 2012-11-22 LAB — POCT GLYCOSYLATED HEMOGLOBIN (HGB A1C): Hemoglobin A1C: 5.5

## 2012-11-22 MED ORDER — FLUTICASONE PROPIONATE 50 MCG/ACT NA SUSP: 2.0000 | Freq: Every day | NASAL | Status: DC

## 2012-11-22 NOTE — Progress Notes (Signed)
LAST EYE:06/2012 LAST FOOT EXAM:? B/S READING:97 TO 110 TEST 1 TIMES A DAY

## 2012-11-22 NOTE — Progress Notes (Signed)
  Subjective:    Patient ID: Sabrina Aguirre, female    DOB: 03-10-1941, 72 y.o.   MRN: [id number]  HPI She has a one-week history of sneezing, itchy watery eyes, runny nose, nasal congestion area and she gets this every year at this time. Presentlyshe is taking Allegra but has received no benefit. Her migraine headaches seem to be under good control. She also has diabetes and has not been checked in quite some time. She seems to be doing well with this on her present medication regimen. Social and family history were reviewed   Review of Systems     Objective:   Physical Exam alert and in no distress. Tympanic membranes and canals are normal. Nasal mucosa is reddish purpleThroat is clear. Tonsils are normal. Neck is supple without adenopathy or thyromegaly. Cardiac exam shows a regular sinus rhythm without murmurs or gallops. Lungs are clear to auscultation. Hemoglobin A1c is 5.5       Assessment & Plan:  Diabetes mellitus type 2, noninsulin dependent - Plan: POCT glycosylated hemoglobin (Hb A1C), Lipid panel, CBC with Differential, Comprehensive metabolic panel  Allergic rhinitis - Plan: fluticasone (FLONASE) 50 MCG/ACT nasal spray  HTN (hypertension) - Plan: CBC with Differential, Comprehensive metabolic panel  Migraine headache  Hyperlipidemia LDL goal < 70 - Plan: Lipid panel  Encounter for long-term (current) use of other medications - Plan: Lipid panel, CBC with Differential, Comprehensive metabolic panel I will have her switch to Claritin. She is to let me know how she is tolerating this. Also discussed her diabetes and she is doing quite well with this.I discussed readjusting her medications however at this point she is unwilling to do that since she is having a good response

## 2012-11-22 NOTE — Patient Instructions (Addendum)
Switch to Claritin or generic is also okay. Use the nasal spray regularly and if you're not feeling better in a couple of days give me a call

## 2012-11-25 ENCOUNTER — Other Ambulatory Visit: Payer: Self-pay

## 2012-11-25 DIAGNOSIS — G43909 Migraine, unspecified, not intractable, without status migrainosus: Secondary | ICD-10-CM

## 2012-11-25 MED ORDER — PIOGLITAZONE HCL 30 MG PO TABS: 30.0000 mg | ORAL_TABLET | Freq: Every day | ORAL | Status: DC

## 2012-11-25 MED ORDER — VALSARTAN-HYDROCHLOROTHIAZIDE 320-12.5 MG PO TABS: ORAL_TABLET | ORAL | Status: DC

## 2012-11-25 MED ORDER — AMITRIPTYLINE HCL 10 MG PO TABS: 10.0000 mg | ORAL_TABLET | Freq: Every day | ORAL | Status: DC

## 2012-11-25 MED ORDER — METFORMIN HCL 500 MG PO TABS: 500.0000 mg | ORAL_TABLET | Freq: Two times a day (BID) | ORAL | Status: DC

## 2012-11-25 MED ORDER — SUMATRIPTAN SUCCINATE 100 MG PO TABS: 100.0000 mg | ORAL_TABLET | ORAL | Status: DC | PRN

## 2012-11-25 NOTE — Progress Notes (Signed)
Quick Note:  Called patient to inform her of her labs( Labs look great. Continue on present medications) patient verbalized understanding ______

## 2012-11-25 NOTE — Telephone Encounter (Signed)
Sent in pt meds

## 2012-12-11 ENCOUNTER — Telehealth: Payer: Self-pay | Admitting: Family Medicine

## 2012-12-11 NOTE — Telephone Encounter (Signed)
LM

## 2012-12-13 ENCOUNTER — Ambulatory Visit (INDEPENDENT_AMBULATORY_CARE_PROVIDER_SITE_OTHER): Payer: Medicare PPO | Admitting: Family Medicine

## 2012-12-13 ENCOUNTER — Encounter: Payer: Self-pay | Admitting: Family Medicine

## 2012-12-13 VITALS — BP 112/72 | HR 76 | Wt 168.0 lb

## 2012-12-13 DIAGNOSIS — M461 Sacroiliitis, not elsewhere classified: Secondary | ICD-10-CM

## 2012-12-13 NOTE — Progress Notes (Signed)
  Subjective:    Patient ID: Sabrina Aguirre, female    DOB: 05-25-1941, 72 y.o.   MRN: [id number]  HPI She has a 6 week history of sharp left-sided leg pain she did have 5 or 6 of these per day lasting 5 minutes. No fever, chills, nausea or vomiting.she gives no history of change in pain with physical activity. No numbness, tingling or weakness.   Review of Systems     Objective:   Physical Exam Alert and in no distress. Tender to palpation over the upper left SI joint. Pearlean Brownie and stork test was positive.       Assessment & Plan:  Sacroiliitis instructions given for his heat, stretching and anti-inflammatory. She will return here if continued difficulty.

## 2012-12-13 NOTE — Patient Instructions (Signed)
Sacroiliac Joint Dysfunction The sacroiliac joint connects the lower part of the spine (the sacrum) with the bones of the pelvis. CAUSES  Sometimes, there is no obvious reason for sacroiliac joint dysfunction. Other times, it may occur   During pregnancy.  After injury, such as:  Car accidents.  Sport-related injuries.  Work-related injuries.  Due to one leg being shorter than the other.  Due to other conditions that affect the joints, such as:  Rheumatoid arthritis.  Gout.  Psoriasis.  Joint infection (septic arthritis). SYMPTOMS  Symptoms may include:  Pain in the:  Lower back.  Buttocks.  Groin.  Thighs and legs.  Difficult sitting, standing, walking, lying, bending or lifting. DIAGNOSIS  A number of tests may be used to help diagnose the cause of sacroiliac joint dysfunction, including:  Imaging tests to look for other causes of pain, including:  MRI.  CT scan.  Bone scan.  Diagnostic injection: During a special x-ray (called fluoroscopy), a needle is put into the sacroiliac joint. A numbing medicine is injected into the joint. If the pain is improved or stopped, the diagnosis of sacroiliac joint dysfunction is more likely. TREATMENT  There are a number of types of treatment used for sacroiliac joint dysfunction, including:  Only take over-the-counter or prescription medicines for pain, discomfort, or fever as directed by your caregiver.  Medications to relax muscles.  Rest. Decreasing activity can help cut down on painful muscle spasms and allow the back to heal.  Application of heat or ice to the lower back may improve muscle spasms and soothe pain.  Brace. A special back brace, called a sacroiliac belt, can help support the joint while your back is healing.  Physical therapy can help teach comfortable positions and exercises to strengthen muscles that support the sacroiliac joint.  Cortisone injections. Injections of steroid medicine into the  joint can help decrease swelling and improve pain.  Hyaluronic acid injections. This chemical improves lubrication within the sacroiliac joint, thereby decreasing pain.  Radiofrequency ablation. A special needle is placed into the joint, where it burns away nerves that are carrying pain messages from the joint.  Surgery. Because pain occurs during movement of the joint, screws and plates may be installed in order to limit or prevent joint motion. HOME CARE INSTRUCTIONS   Take all medications exactly as directed.  Follow instructions regarding both rest and physical activity, to avoid worsening the pain.  Do physical therapy exercises exactly as prescribed. SEEK IMMEDIATE MEDICAL CARE IF:  You experience increasingly severe pain.  You develop new symptoms, such as numbness or tingling in your legs or feet.  You lose bladder or bowel control. Document Released: 11/03/2008 Document Revised: 10/30/2011 Document Reviewed: 11/03/2008 [facility] Patient Information 2013 [place], [place]. Heat for 20 minutes 3 times per day and then do stretching afterwards. Do knee to chest and then also the rotation times the stretching. 2 Aleve twice per day and do this all for the next 10 days to 2 weeks. If no better give me a call

## 2013-02-03 ENCOUNTER — Other Ambulatory Visit: Payer: Self-pay

## 2013-02-03 DIAGNOSIS — Z1231 Encounter for screening mammogram for malignant neoplasm of breast: Secondary | ICD-10-CM

## 2013-03-19 ENCOUNTER — Ambulatory Visit
Admission: RE | Admit: 2013-03-19 | Discharge: 2013-03-19 | Disposition: A | Discharge status: Home / Self Care | Payer: Medicare PPO | Source: Ambulatory Visit

## 2013-03-19 DIAGNOSIS — Z1231 Encounter for screening mammogram for malignant neoplasm of breast: Secondary | ICD-10-CM

## 2013-03-20 ENCOUNTER — Encounter: Payer: Self-pay | Admitting: Women's Health

## 2013-03-20 ENCOUNTER — Ambulatory Visit (INDEPENDENT_AMBULATORY_CARE_PROVIDER_SITE_OTHER): Payer: Medicare PPO | Admitting: Women's Health

## 2013-03-20 VITALS — BP 114/74 | Ht 64.5 in | Wt 168.0 lb

## 2013-03-20 DIAGNOSIS — M8580 Other specified disorders of bone density and structure, unspecified site: Secondary | ICD-10-CM

## 2013-03-20 DIAGNOSIS — M899 Disorder of bone, unspecified: Secondary | ICD-10-CM

## 2013-03-20 NOTE — Progress Notes (Signed)
Sabrina Aguirre Sep 19, 1940 [id number]    History:    The patient presents for breast and pelvic exam. TVH 1979 for prolapse. Diabetes/hypertension/GERD/migraines primary care manages labs and meds. Negative colonoscopy 2013, history of benign colon polyps prior. Normal Pap history. DEXA 03/2011 T score -1.6 her right hip. History of a benign right breast lumpectomy normal mammograms.  Past medical history, past surgical history, family history and social history were all reviewed and documented in the EPIC chart. Retired Chartered loss adjuster, separated for 24 years, not sexually active. 12 grandchildren.  Exam:  Filed Vitals:   03/20/13 1044  BP: 114/74    General appearance:  Normal Head/Neck:  Normal, without cervical or supraclavicular adenopathy. Thyroid:  Symmetrical, normal in size, without palpable masses or nodularity. Respiratory  Effort:  Normal  Auscultation:  Clear without wheezing or rhonchi Cardiovascular  Auscultation:  Regular rate, without rubs, murmurs or gallops  Edema/varicosities:  Not grossly evident Abdominal  Soft,nontender, without masses, guarding or rebound.  Liver/spleen:  No organomegaly noted  Hernia:  None appreciated  Skin  Inspection:  Grossly normal  Palpation:  Grossly normal Neurologic/psychiatric  Orientation:  Normal with appropriate conversation.  Mood/affect:  Normal  Genitourinary    Breasts: Examined lying and sitting.     Right: Without masses, retractions, discharge or axillary adenopathy.     Left: Without masses, retractions, discharge or axillary adenopathy.   Inguinal/mons:  Normal without inguinal adenopathy  External genitalia:  Normal  BUS/Urethra/Skene's glands:  Normal  Bladder:  Normal  Vagina:  Normal  Cervix:  absent Uterus:absent  Adnexa/parametria:     Rt: Without masses or tenderness.   Lt: Without masses or tenderness.  Anus and perineum: Normal  Digital rectal exam: Normal sphincter tone without palpated masses or  tenderness  Assessment/Plan:  72 y.o. DBF G7P6 for breast and pelvic exam.  Osteopenia TVH on no HRT Diabetes/hypertension/GERD/hyperlipidemia-primary care labs and meds  Plan: SBE's, continue annual mammogram, calcium rich diet, vitamin D 2000 daily. Zostavac and Pneumovax reviewed, will get this year. DEXA will repeat, reviewed importance of continuing regular exercise, home safety and fall prevention discussed.   Harrington Challenger WHNP, 11:15 AM 03/20/2013

## 2013-03-20 NOTE — Patient Instructions (Addendum)
Shingles/zostavac  And Pnuemonia vaccine Health Recommendations for Postmenopausal Women Respected and ongoing research has looked at the most common causes of death, disability, and poor quality of life in postmenopausal women. The causes include heart disease, diseases of blood vessels, diabetes, depression, cancer, and bone loss (osteoporosis). Many things can be done to help lower the chances of developing these and other common problems: CARDIOVASCULAR DISEASE Heart Disease: A heart attack is a medical emergency. Know the signs and symptoms of a heart attack. Below are things women can do to reduce their risk for heart disease.   Do not smoke. If you smoke, quit.  Aim for a healthy weight. Being overweight causes many preventable deaths. Eat a healthy and balanced diet and drink an adequate amount of liquids.  Get moving. Make a commitment to be more physically active. Aim for 30 minutes of activity on most, if not all days of the week.  Eat for heart health. Choose a diet that is low in saturated fat and cholesterol and eliminate trans fat. Include whole grains, vegetables, and fruits. Read and understand the labels on food containers before buying.  Know your numbers. Ask your caregiver to check your blood pressure, cholesterol (total, HDL, LDL, triglycerides) and blood glucose. Work with your caregiver on improving your entire clinical picture.  High blood pressure. Limit or stop your table salt intake (try salt substitute and food seasonings). Avoid salty foods and drinks. Read labels on food containers before buying. Eating well and exercising can help control high blood pressure. STROKE  Stroke is a medical emergency. Stroke may be the result of a blood clot in a blood vessel in the brain or by a brain hemorrhage (bleeding). Know the signs and symptoms of a stroke. To lower the risk of developing a stroke:  Avoid fatty foods.  Quit smoking.  Control your diabetes, blood pressure,  and irregular heart rate. THROMBOPHLEBITIS (BLOOD CLOT) OF THE LEG  Becoming overweight and leading a stationary lifestyle may also contribute to developing blood clots. Controlling your diet and exercising will help lower the risk of developing blood clots. CANCER SCREENING  Breast Cancer: Take steps to reduce your risk of breast cancer.  You should practice "breast self-awareness." This means understanding the normal appearance and feel of your breasts and should include breast self-examination. Any changes detected, no matter how small, should be reported to your caregiver.  After age 5, you should have a clinical breast exam (CBE) every year.  Starting at age 82, you should consider having a mammogram (breast X-ray) every year.  If you have a family history of breast cancer, talk to your caregiver about genetic screening.  If you are at high risk for breast cancer, talk to your caregiver about having an MRI and a mammogram every year.  Intestinal or Stomach Cancer: Tests to consider are a rectal exam, fecal occult blood, sigmoidoscopy, and colonoscopy. Women who are high risk may need to be screened at an earlier age and more often.  Cervical Cancer:  Beginning at age 27, you should have a Pap test every 3 years as long as the past 3 Pap tests have been normal.  If you have had past treatment for cervical cancer or a condition that could lead to cancer, you need Pap tests and screening for cancer for at least 20 years after your treatment.  If you had a hysterectomy for a problem that was not cancer or a condition that could lead to cancer, then you no  longer need Pap tests.  If you are between ages 61 and 52, and you have had normal Pap tests going back 10 years, you no longer need Pap tests.  If Pap tests have been discontinued, risk factors (such as a new sexual partner) need to be reassessed to determine if screening should be resumed.  Some medical problems can increase the  chance of getting cervical cancer. In these cases, your caregiver may recommend more frequent screening and Pap tests.  Uterine Cancer: If you have vaginal bleeding after reaching menopause, you should notify your caregiver.  Ovarian cancer: Other than yearly pelvic exams, there are no reliable tests available to screen for ovarian cancer at this time except for yearly pelvic exams.  Lung Cancer: Yearly chest X-rays can detect lung cancer and should be done on high risk women, such as cigarette smokers and women with chronic lung disease (emphysema).  Skin Cancer: A complete body skin exam should be done at your yearly examination. Avoid overexposure to the sun and ultraviolet light lamps. Use a strong sun block cream when in the sun. All of these things are important in lowering the risk of skin cancer. MENOPAUSE Menopause Symptoms: Hormone therapy products are effective for treating symptoms associated with menopause:  Moderate to severe hot flashes.  Night sweats.  Mood swings.  Headaches.  Tiredness.  Loss of sex drive.  Insomnia.  Other symptoms. Hormone replacement carries certain risks, especially in older women. Women who use or are thinking about using estrogen or estrogen with progestin treatments should discuss that with their caregiver. Your caregiver will help you understand the benefits and risks. The ideal dose of hormone replacement therapy is not known. The Food and Drug Administration (FDA) has concluded that hormone therapy should be used only at the lowest doses and for the shortest amount of time to reach treatment goals.  OSTEOPOROSIS Protecting Against Bone Loss and Preventing Fracture: If you use hormone therapy for prevention of bone loss (osteoporosis), the risks for bone loss must outweigh the risk of the therapy. Ask your caregiver about other medications known to be safe and effective for preventing bone loss and fractures. To guard against bone loss or  fractures, the following is recommended:  If you are less than age 29, take 1000 mg of calcium and at least 600 mg of Vitamin D per day.  If you are greater than age 48 but less than age 53, take 1200 mg of calcium and at least 600 mg of Vitamin D per day.  If you are greater than age 1, take 1200 mg of calcium and at least 800 mg of Vitamin D per day. Smoking and excessive alcohol intake increases the risk of osteoporosis. Eat foods rich in calcium and vitamin D and do weight bearing exercises several times a week as your caregiver suggests. DIABETES Diabetes Melitus: If you have Type I or Type 2 diabetes, you should keep your blood sugar under control with diet, exercise and recommended medication. Avoid too many sweets, starchy and fatty foods. Being overweight can make control more difficult. COGNITION AND MEMORY Cognition and Memory: Menopausal hormone therapy is not recommended for the prevention of cognitive disorders such as Alzheimer's disease or memory loss.  DEPRESSION  Depression may occur at any age, but is common in elderly women. The reasons may be because of physical, medical, social (loneliness), or financial problems and needs. If you are experiencing depression because of medical problems and control of symptoms, talk to your caregiver  about this. Physical activity and exercise may help with mood and sleep. Community and volunteer involvement may help your sense of value and worth. If you have depression and you feel that the problem is getting worse or becoming severe, talk to your caregiver about treatment options that are best for you. ACCIDENTS  Accidents are common and can be serious in the elderly woman. Prepare your house to prevent accidents. Eliminate throw rugs, place hand bars in the bath, shower and toilet areas. Avoid wearing high heeled shoes or walking on wet, snowy, and icy areas. Limit or stop driving if you have vision or hearing problems, or you feel you are  unsteady with you movements and reflexes. HEPATITIS C Hepatitis C is a type of viral infection affecting the liver. It is spread mainly through contact with blood from an infected person. It can be treated, but if left untreated, it can lead to severe liver damage over years. Many people who are infected do not know that the virus is in their blood. If you are a "baby-boomer", it is recommended that you have one screening test for Hepatitis C. IMMUNIZATIONS  Several immunizations are important to consider having during your senior years, including:   Tetanus, diptheria, and pertussis booster shot.  Influenza every year before the flu season begins.  Pneumonia vaccine.  Shingles vaccine.  Others as indicated based on your specific needs. Talk to your caregiver about these. Document Released: 09/29/2005 Document Revised: 07/24/2012 Document Reviewed: 05/25/2008 [facility] Patient Information 2014 [employer], [place].

## 2013-03-24 ENCOUNTER — Ambulatory Visit: Payer: Medicare PPO | Admitting: Family Medicine

## 2013-04-30 ENCOUNTER — Telehealth: Payer: Self-pay | Admitting: Family Medicine

## 2013-04-30 MED ORDER — METFORMIN HCL 500 MG PO TABS: 500.0000 mg | ORAL_TABLET | Freq: Two times a day (BID) | ORAL | Status: DC

## 2013-04-30 MED ORDER — SUMATRIPTAN SUCCINATE 100 MG PO TABS: 100.0000 mg | ORAL_TABLET | ORAL | Status: DC | PRN

## 2013-04-30 NOTE — Telephone Encounter (Signed)
Metformin and Imitrex renewed at patient request

## 2013-05-19 ENCOUNTER — Ambulatory Visit (INDEPENDENT_AMBULATORY_CARE_PROVIDER_SITE_OTHER): Payer: Medicare PPO | Admitting: Family Medicine

## 2013-05-19 ENCOUNTER — Encounter: Payer: Self-pay | Admitting: Family Medicine

## 2013-05-19 VITALS — BP 120/80 | HR 80 | Wt 164.0 lb

## 2013-05-19 DIAGNOSIS — E119 Type 2 diabetes mellitus without complications: Secondary | ICD-10-CM

## 2013-05-19 DIAGNOSIS — I10 Essential (primary) hypertension: Secondary | ICD-10-CM

## 2013-05-19 DIAGNOSIS — Z23 Encounter for immunization: Secondary | ICD-10-CM

## 2013-05-19 DIAGNOSIS — E785 Hyperlipidemia, unspecified: Secondary | ICD-10-CM

## 2013-05-19 LAB — POCT GLYCOSYLATED HEMOGLOBIN (HGB A1C): Hemoglobin A1C: 5.3

## 2013-05-19 NOTE — Progress Notes (Signed)
Subjective:    Sabrina Aguirre is a 72 y.o. female who presents for follow-up of Type 2 diabetes mellitus.    Home blood sugar records: 100  Current symptoms/problems FEET ARE HAVING PAIN however the pain is only 3 times per week and usually only last for 10 minutes Daily foot checks:  Any foot concerns:  YES as above Last eye exam:   05/30/12 HAS APPOINTMENT   Medication compliance: She did stop taking her Actos shortly after our last visit. Current diet: NONE Current exercise: WALKING 3 TIMES A DAY  Known diabetic complications: none Cardiovascular risk factors: advanced age (older than 58 for men, 72 for women), diabetes mellitus, dyslipidemia and hypertension   The following portions of the patient's history were reviewed and updated as appropriate: allergies, current medications, past family history, past medical history, past social history and problem list.  ROS as in subjective above    Objective:    Wt 164 lb (74.39 kg)  BMI 27.73 kg/m2  There were no vitals filed for this visit.  General appearence: alert, no distress, WD/WN Neck: supple, no lymphadenopathy, no thyromegaly, no masses Heart: RRR, normal S1, S2, no murmurs Lungs: CTA bilaterally, no wheezes, rhonchi, or rales Abdomen: +bs, soft, non tender, non distended, no masses, no hepatomegaly, no splenomegaly Pulses: 2+ symmetric, upper and lower extremities, normal cap refill Ext: no edema Foot exam:  Neuro: foot monofilament exam normal   Lab Review Lab Results  Component Value Date   HGBA1C 5.5 11/22/2012   Lab Results  Component Value Date   CHOL 110 11/22/2012   HDL 62 11/22/2012   LDLCALC 28 11/22/2012   TRIG 100 11/22/2012   CHOLHDL 1.8 11/22/2012   No results found for this basenameConcepcion Elk     Chemistry      Component Value Date/Time   NA 139 11/22/2012 1041   K 3.9 11/22/2012 1041   CL 103 11/22/2012 1041   CO2 27 11/22/2012 1041   BUN 10 11/22/2012 1041   CREATININE 0.76 11/22/2012 1041    CREATININE 0.64 03/22/2011 2025      Component Value Date/Time   CALCIUM 10.2 11/22/2012 1041   ALKPHOS 69 11/22/2012 1041   AST 18 11/22/2012 1041   ALT 13 11/22/2012 1041   BILITOT 0.4 11/22/2012 1041        Chemistry      Component Value Date/Time   NA 139 11/22/2012 1041   K 3.9 11/22/2012 1041   CL 103 11/22/2012 1041   CO2 27 11/22/2012 1041   BUN 10 11/22/2012 1041   CREATININE 0.76 11/22/2012 1041   CREATININE 0.64 03/22/2011 2025      Component Value Date/Time   CALCIUM 10.2 11/22/2012 1041   ALKPHOS 69 11/22/2012 1041   AST 18 11/22/2012 1041   ALT 13 11/22/2012 1041   BILITOT 0.4 11/22/2012 1041     Hemoglobin A1c is 5.3  Diabetic foot exam was entirely normal  Assessment:  Diabetes mellitus type 2, noninsulin dependent - Plan: POCT glycosylated hemoglobin (Hb A1C)  Immunization due - Plan: Pneumococcal polysaccharide vaccine 23-valent greater than or equal to 2yo subcutaneous/IM  Hyperlipidemia LDL goal < 70  HTN (hypertension)       Plan:    1.  Rx changes: none 2.  Education: Reviewed 'ABCs' of diabetes management (respective goals in parentheses):  A1C (<7), blood pressure (<130/80), and cholesterol (LDL <100). 3.  Compliance at present is estimated to be excellent. Efforts to improve compliance (if  necessary) will be directed at None. 4. Follow up: 4 months  I complimented her on her low hemoglobin A1c and agree with stopping the Actos.

## 2013-09-02 ENCOUNTER — Telehealth: Payer: Self-pay | Admitting: Internal Medicine

## 2013-09-02 ENCOUNTER — Encounter: Payer: Self-pay | Admitting: Family Medicine

## 2013-09-02 ENCOUNTER — Ambulatory Visit (INDEPENDENT_AMBULATORY_CARE_PROVIDER_SITE_OTHER): Payer: Medicare PPO | Admitting: Family Medicine

## 2013-09-02 VITALS — BP 132/100 | HR 104 | Temp 98.7°F | Wt 167.0 lb

## 2013-09-02 DIAGNOSIS — I10 Essential (primary) hypertension: Secondary | ICD-10-CM

## 2013-09-02 DIAGNOSIS — G43909 Migraine, unspecified, not intractable, without status migrainosus: Secondary | ICD-10-CM

## 2013-09-02 DIAGNOSIS — E119 Type 2 diabetes mellitus without complications: Secondary | ICD-10-CM

## 2013-09-02 DIAGNOSIS — J309 Allergic rhinitis, unspecified: Secondary | ICD-10-CM

## 2013-09-02 DIAGNOSIS — J019 Acute sinusitis, unspecified: Secondary | ICD-10-CM

## 2013-09-02 MED ORDER — VALSARTAN-HYDROCHLOROTHIAZIDE 320-12.5 MG PO TABS: ORAL_TABLET | ORAL | Status: DC

## 2013-09-02 MED ORDER — METFORMIN HCL 500 MG PO TABS: 500.0000 mg | ORAL_TABLET | Freq: Two times a day (BID) | ORAL | Status: DC

## 2013-09-02 MED ORDER — SUMATRIPTAN SUCCINATE 100 MG PO TABS: 100.0000 mg | ORAL_TABLET | ORAL | Status: DC | PRN

## 2013-09-02 MED ORDER — AMOXICILLIN 875 MG PO TABS: 875.0000 mg | ORAL_TABLET | Freq: Two times a day (BID) | ORAL | Status: DC

## 2013-09-02 NOTE — Progress Notes (Signed)
   Subjective:    Patient ID: Sabrina Aguirre, female    DOB: 1941/01/10, 73 y.o.   MRN: [id number]  HPI Approximately 5 days ago she started with a sore throat and coughing. The next day she had difficulty with vomiting and diarrhea and continued sore throat and coughing . Last night she developed increased sinus congestion and pain, purulent PND as well as upper tooth discomfort. He is now having fever and chills as well as ear congestion with popping. She also needs a refill on her migraine medication. She states that her blood sugars are still remaining in a good range. She continues on her blood pressure medication. Review of Systems     Objective:   Physical Exam alert and likely toxic appearing. Tympanic membranes and canals are normal. Throat is clear. Tonsils are normal. Neck is supple without adenopathy or thyromegaly. Cardiac exam shows a regular sinus rhythm without murmurs or gallops. Lungs are clear to auscultation. Nasal mucosa is red with tenderness over all sinuses.       Assessment & Plan:  Allergic rhinitis  Diabetes mellitus type 2, noninsulin dependent  HTN (hypertension)  Acute sinusitis - Plan: amoxicillin (AMOXIL) 875 MG tablet  Migraine headache - Plan: SUMAtriptan (IMITREX) 100 MG tablet  she is to call me if not entirely better.

## 2013-09-02 NOTE — Telephone Encounter (Signed)
Pt needs a refill on imitrex. Send to [facility]

## 2013-09-02 NOTE — Patient Instructions (Signed)
Take all the antibiotic and if not totally back to normal when you finish give me a call. Use Afrin nasal spray at night seek agrees for your nose. Tylenol, Advil for pain and aches. You can take 4 Advil 3 times a day for fever aches and pains if you need to.

## 2013-09-03 NOTE — Telephone Encounter (Signed)
done

## 2013-09-05 ENCOUNTER — Telehealth: Payer: Self-pay | Admitting: Family Medicine

## 2013-09-05 MED ORDER — AZITHROMYCIN 500 MG PO TABS: 500.0000 mg | ORAL_TABLET | Freq: Every day | ORAL | Status: DC

## 2013-09-05 NOTE — Telephone Encounter (Signed)
Azithromycin will be called in.

## 2013-09-05 NOTE — Telephone Encounter (Signed)
Please call, patient states she has been sick since last Thursday or Friday. Diagnosed with flu and sinus infection She wants tamiflu called in, I told her that I thought it was past the time frame for tamiflu to be helpful but I would send message back , patient wants to know if she can get z pak atleast      Copy

## 2013-09-05 NOTE — Telephone Encounter (Signed)
Pt aware.

## 2013-09-05 NOTE — Telephone Encounter (Signed)
Let her know that I called in an antibiotic 

## 2013-09-15 ENCOUNTER — Telehealth: Payer: Self-pay | Admitting: Internal Medicine

## 2013-09-15 MED ORDER — AMOXICILLIN-POT CLAVULANATE 875-125 MG PO TABS: 1.0000 | ORAL_TABLET | Freq: Two times a day (BID) | ORAL | Status: DC

## 2013-09-15 NOTE — Telephone Encounter (Signed)
Pt is sick in bed. Still has the flu and sinus infection. She is weak, fever, chills, body aches. She would like something called in for her to [facility] [address], please call pt back to let her know something has been sent cause she will have to get someone to go pick it up

## 2013-09-15 NOTE — Telephone Encounter (Signed)
I discussed her care with her. I will call in Augmentin and if she is not back to normal when she finishes, she is to call.

## 2013-09-22 ENCOUNTER — Ambulatory Visit: Payer: Medicare PPO | Admitting: Family Medicine

## 2013-10-16 ENCOUNTER — Ambulatory Visit: Payer: Medicare PPO | Admitting: Family Medicine

## 2013-10-28 ENCOUNTER — Encounter: Payer: Self-pay | Admitting: Family Medicine

## 2013-10-28 ENCOUNTER — Ambulatory Visit (INDEPENDENT_AMBULATORY_CARE_PROVIDER_SITE_OTHER): Payer: Medicare PPO | Admitting: Family Medicine

## 2013-10-28 VITALS — BP 130/100 | HR 90 | Wt 166.0 lb

## 2013-10-28 DIAGNOSIS — E119 Type 2 diabetes mellitus without complications: Secondary | ICD-10-CM

## 2013-10-28 DIAGNOSIS — J309 Allergic rhinitis, unspecified: Secondary | ICD-10-CM

## 2013-10-28 LAB — POCT GLYCOSYLATED HEMOGLOBIN (HGB A1C): Hemoglobin A1C: 5.5

## 2013-10-28 MED ORDER — CICLESONIDE 50 MCG/ACT NA SUSP: 2.0000 | Freq: Every day | NASAL | Status: DC

## 2013-10-28 NOTE — Progress Notes (Signed)
   Subjective:    Patient ID: Sabrina Aguirre, female    DOB: Jul 28, 1941, 73 y.o.   MRN: [id number]  HPI She is here for recheck. Since January she has been placed on Amoxil, azithromycin and most recently Augmentin for treatment of pulmonary symptoms. At this time she mainly complains of coughing as well as postnasal drainage. She has been using Flonase and Claritin. She does state that the Flonase, since starting that has improved her postnasal drainage symptoms but not the cough. Fever, chills, sore throat area and She also would like a hemoglobin A1c checked.  Review of Systems     Objective:   Physical Exam alert and in no distress. Tympanic membranes and canals are normal. Throat is clear. Tonsils are normal. Neck is supple without adenopathy or thyromegaly. Cardiac exam shows a regular sinus rhythm without murmurs or gallops. Lungs are clear to auscultation. Hemoglobin A1c is 5.5       Assessment & Plan:  Diabetes mellitus type 2, noninsulin dependent - Plan: HgB A1c  Allergic rhinitis  I will switch her to [facility] and have her also changed to Zyrtec. She is to call me in 2 or 3 weeks and it still having difficulty, I will add asked to protrude her regimen.

## 2013-10-28 NOTE — Patient Instructions (Signed)
Switch to Zyrtec and take it at night. Use the Omnaris regularly and call me in about 2 weeks

## 2013-11-06 LAB — HM DIABETES EYE EXAM

## 2013-11-26 ENCOUNTER — Ambulatory Visit (INDEPENDENT_AMBULATORY_CARE_PROVIDER_SITE_OTHER): Payer: Medicare PPO | Admitting: Family Medicine

## 2013-11-26 ENCOUNTER — Encounter: Payer: Self-pay | Admitting: Family Medicine

## 2013-11-26 ENCOUNTER — Encounter: Payer: Self-pay | Admitting: Internal Medicine

## 2013-11-26 DIAGNOSIS — M461 Sacroiliitis, not elsewhere classified: Secondary | ICD-10-CM

## 2013-11-26 NOTE — Progress Notes (Signed)
   Subjective:    Patient ID: Sabrina Aguirre, female    DOB: 1941/05/01, 73 y.o.   MRN: [id number]  HPI She has a history of chronic left-sided low back pain and in the last month it has reoccurred. She also notes difficulty with shortness of breath, coughing fatigue. She recently found out that there was mold and mildew in her condo area and her symptoms started about 3 months ago. This is about the same time she started smelling mold and mildew in her condo. They apparently did send someone out that used [facility].  Review of Systems     Objective:   Physical Exam alert and in no distress. Tympanic membranes and canals are normal. Throat is clear. Tonsils are normal. Neck is supple without adenopathy or thyromegaly. Cardiac exam shows a regular sinus rhythm without murmurs or gallops. Lungs are clear to auscultation. Back exam does show tenderness over the left SI joint with Kerrin Champagne and Corky Sox testing being positive. Straight leg raising was negative. DTRs normal.       Assessment & Plan:  Sacroiliitis  I instructed her on proper stretching for her SI joint dysfunction. Also discussed the mold and mildew. She will followup with rental agency. Discussed possibly calling the health department arguing with the city in regard to proper mold remediation. We may possibly need to do skin testing for mold and mildew and will defer at this time. Over 20 minutes spent discussing all these issues with her.

## 2013-11-26 NOTE — Patient Instructions (Signed)
Do knee to chest exercises and rotational type exercises. You can use Advil or Aleve. Also use heat to the area for 20 minutes 3 times per day but do the heat and then after that do the stretching

## 2013-12-06 ENCOUNTER — Other Ambulatory Visit: Payer: Self-pay | Admitting: Family Medicine

## 2013-12-08 ENCOUNTER — Other Ambulatory Visit: Payer: Self-pay | Admitting: Family Medicine

## 2013-12-08 MED ORDER — VALSARTAN-HYDROCHLOROTHIAZIDE 320-12.5 MG PO TABS: ORAL_TABLET | ORAL | Status: DC

## 2013-12-08 NOTE — Telephone Encounter (Signed)
[facility] rf on valsartan/hctz 320/12.5mg  tab with 30 pills and needs cpe

## 2013-12-24 ENCOUNTER — Telehealth: Payer: Self-pay | Admitting: Family Medicine

## 2013-12-25 ENCOUNTER — Telehealth: Payer: Self-pay | Admitting: Family Medicine

## 2013-12-25 DIAGNOSIS — G43909 Migraine, unspecified, not intractable, without status migrainosus: Secondary | ICD-10-CM

## 2013-12-25 MED ORDER — VALSARTAN-HYDROCHLOROTHIAZIDE 320-12.5 MG PO TABS: ORAL_TABLET | ORAL | Status: DC

## 2013-12-25 MED ORDER — SUMATRIPTAN SUCCINATE 100 MG PO TABS: 100.0000 mg | ORAL_TABLET | ORAL | Status: DC | PRN

## 2013-12-25 MED ORDER — METFORMIN HCL 500 MG PO TABS: 500.0000 mg | ORAL_TABLET | Freq: Two times a day (BID) | ORAL | Status: DC

## 2013-12-25 NOTE — Telephone Encounter (Signed)
Meds renewed.

## 2013-12-25 NOTE — Telephone Encounter (Signed)
P.A. Denied for #10, will only pay for #9, called pharmacy & #9 went thru.  Pt informed.

## 2014-01-26 ENCOUNTER — Other Ambulatory Visit: Payer: Self-pay

## 2014-01-26 DIAGNOSIS — Z1231 Encounter for screening mammogram for malignant neoplasm of breast: Secondary | ICD-10-CM

## 2014-02-24 ENCOUNTER — Ambulatory Visit
Admission: RE | Admit: 2014-02-24 | Discharge: 2014-02-24 | Disposition: A | Discharge status: Home / Self Care | Payer: Medicare HMO | Source: Ambulatory Visit | Attending: Family Medicine | Admitting: Family Medicine

## 2014-02-24 ENCOUNTER — Ambulatory Visit (INDEPENDENT_AMBULATORY_CARE_PROVIDER_SITE_OTHER): Payer: Medicare PPO | Admitting: Family Medicine

## 2014-02-24 ENCOUNTER — Encounter: Payer: Self-pay | Admitting: Family Medicine

## 2014-02-24 VITALS — BP 140/90 | HR 98 | Wt 169.0 lb

## 2014-02-24 DIAGNOSIS — M461 Sacroiliitis, not elsewhere classified: Secondary | ICD-10-CM

## 2014-02-24 NOTE — Progress Notes (Signed)
   Subjective:    Patient ID: Sabrina Aguirre, female    DOB: [birth year], 73 y.o.   MRN: [id number]  HPI She has a history of intermittent left flank pain for several years. The most recent episode her to 3 weeks ago. Initially it was intermittent and now it's more constant. Motion makes the pain worse. No radiation down her leg. No numbness, tingling or weakness.  Review of Systems     Objective:   Physical Exam Alert and in no distress. Exam of her back shows no lesions. Slight tenderness palpation over the left lower SI joint. Pain on motion of the back but good lumbar curve. Corky Sox and stork test was positive. Negative straight leg raising. Good hip motion.       Assessment & Plan:  Sacroiliitis - Plan: DG Lumbar Spine 2-3 Views  since she's had difficulty with this for quite some time, I think an x-ray is appropriate. We'll also have her treat this with heat, stretching and anti-inflammatory. If continued difficulty, further intervention may be needed. I discussed various options with her including physical therapy and chiropractic manipulation.

## 2014-02-24 NOTE — Patient Instructions (Signed)
Heat for 20 minutes 3 times per day. After use heat the area, then stretch. 4 Advil 3 times per day

## 2014-02-25 ENCOUNTER — Other Ambulatory Visit: Payer: Self-pay | Admitting: Family Medicine

## 2014-03-20 ENCOUNTER — Ambulatory Visit
Admission: RE | Admit: 2014-03-20 | Discharge: 2014-03-20 | Disposition: A | Discharge status: Home / Self Care | Payer: Medicare HMO | Source: Ambulatory Visit

## 2014-03-20 DIAGNOSIS — Z1231 Encounter for screening mammogram for malignant neoplasm of breast: Secondary | ICD-10-CM

## 2014-04-07 ENCOUNTER — Encounter: Payer: Self-pay | Admitting: Medical

## 2014-04-07 ENCOUNTER — Ambulatory Visit (INDEPENDENT_AMBULATORY_CARE_PROVIDER_SITE_OTHER): Payer: Medicare PPO | Admitting: Medical

## 2014-04-07 VITALS — BP 150/98 | HR 82 | Temp 97.8°F | Resp 16 | Wt 168.0 lb

## 2014-04-07 DIAGNOSIS — R229 Localized swelling, mass and lump, unspecified: Secondary | ICD-10-CM

## 2014-04-07 DIAGNOSIS — R2232 Localized swelling, mass and lump, left upper limb: Secondary | ICD-10-CM

## 2014-04-07 NOTE — Progress Notes (Signed)
Subjective: Here for lump.  She notices in the last week there seems to be a lump/mass pushing out of the left hand at base of thumb/wrist.  Just noticed this recently, and her superificial veins seem enlarged compared to normal.   The area of the lump is a little tender at times.   Denies numbness, tingling, weakness, redness, warmth.  Denies recent fall or trauma. No other aggravating or relieving factors.  No other lesion.  ROS as in subjective  Objective: Gen: wd, wn, nad Skin: unremarkable MSK: left hand lateral wrist/vs base of thumb with area that appears to be pressing outward, but no obvious bony lump.  There is a somewhat palpable mass at base of thumb/lateral wrist, possible ganglion.  Somewhat mobile.  Pulses normal Hand neurovascularly intact  Assessment: Encounter Diagnosis  Name Primary?  . Mass of hand, left Yes   Plan: Discussed findings.  Dr. Redmond [employer] supervising physician examined as well.  Likely ganglion cyst.  Advised to leave it alone.  If gets progressively worse discomfort, then refer to hand surgery.

## 2014-05-08 ENCOUNTER — Telehealth: Payer: Self-pay | Admitting: Internal Medicine

## 2014-05-08 NOTE — Telephone Encounter (Signed)
Pt uses [facility] for her Diabetic Supplies

## 2014-05-11 ENCOUNTER — Other Ambulatory Visit: Payer: Self-pay

## 2014-05-11 NOTE — Telephone Encounter (Signed)
WAS TRYING TO ORDER PT ACCU-CHECK METER BUT WAS NOT IN THE SYSTEM

## 2014-05-14 ENCOUNTER — Encounter: Payer: Self-pay | Admitting: Internal Medicine

## 2014-06-11 ENCOUNTER — Telehealth: Payer: Self-pay | Admitting: Family Medicine

## 2014-06-11 ENCOUNTER — Other Ambulatory Visit: Payer: Self-pay

## 2014-06-11 MED ORDER — METFORMIN HCL 500 MG PO TABS: 500.0000 mg | ORAL_TABLET | Freq: Two times a day (BID) | ORAL | Status: DC

## 2014-06-11 MED ORDER — SUMATRIPTAN SUCCINATE 100 MG PO TABS: 100.0000 mg | ORAL_TABLET | ORAL | Status: DC | PRN

## 2014-06-11 NOTE — Telephone Encounter (Signed)
Pt needs refills on imitrex and metformin sent to [facility] [address]

## 2014-06-11 NOTE — Telephone Encounter (Signed)
done

## 2014-06-22 ENCOUNTER — Encounter: Payer: Self-pay | Admitting: Medical

## 2014-08-03 ENCOUNTER — Telehealth: Payer: Self-pay | Admitting: Family Medicine

## 2014-08-03 ENCOUNTER — Other Ambulatory Visit: Payer: Self-pay | Admitting: Family Medicine

## 2014-08-03 NOTE — Telephone Encounter (Signed)
done

## 2014-08-07 ENCOUNTER — Telehealth: Payer: Self-pay | Admitting: Internal Medicine

## 2014-08-07 MED ORDER — SUMATRIPTAN SUCCINATE 100 MG PO TABS: 100.0000 mg | ORAL_TABLET | ORAL | Status: DC | PRN

## 2014-08-07 NOTE — Telephone Encounter (Signed)
Pt needs a refill on sumatriptan 100mg  #9 to [facility] [address]

## 2014-09-02 ENCOUNTER — Other Ambulatory Visit: Payer: Self-pay

## 2014-09-02 ENCOUNTER — Telehealth: Payer: Self-pay | Admitting: Family Medicine

## 2014-09-02 MED ORDER — METFORMIN HCL 500 MG PO TABS: 500.0000 mg | ORAL_TABLET | Freq: Two times a day (BID) | ORAL | Status: DC

## 2014-09-02 NOTE — Telephone Encounter (Signed)
Pt called and stated she needs refill on metformin sent to [facility] on [address].

## 2014-10-01 ENCOUNTER — Other Ambulatory Visit: Payer: Self-pay | Admitting: Family Medicine

## 2014-10-29 ENCOUNTER — Ambulatory Visit: Payer: Medicare PPO | Admitting: Family Medicine

## 2014-11-02 ENCOUNTER — Other Ambulatory Visit: Payer: Self-pay | Admitting: Family Medicine

## 2014-11-02 ENCOUNTER — Encounter: Payer: Self-pay | Admitting: Family Medicine

## 2014-11-02 ENCOUNTER — Other Ambulatory Visit: Payer: Self-pay

## 2014-11-02 ENCOUNTER — Ambulatory Visit (INDEPENDENT_AMBULATORY_CARE_PROVIDER_SITE_OTHER): Payer: Medicare PPO | Admitting: Family Medicine

## 2014-11-02 VITALS — BP 140/90 | HR 90 | Wt 175.0 lb

## 2014-11-02 DIAGNOSIS — G43009 Migraine without aura, not intractable, without status migrainosus: Secondary | ICD-10-CM | POA: Diagnosis not present

## 2014-11-02 DIAGNOSIS — I10 Essential (primary) hypertension: Secondary | ICD-10-CM | POA: Diagnosis not present

## 2014-11-02 DIAGNOSIS — M899 Disorder of bone, unspecified: Secondary | ICD-10-CM | POA: Diagnosis not present

## 2014-11-02 DIAGNOSIS — M6530 Trigger finger, unspecified finger: Secondary | ICD-10-CM

## 2014-11-02 DIAGNOSIS — M8580 Other specified disorders of bone density and structure, unspecified site: Secondary | ICD-10-CM

## 2014-11-02 DIAGNOSIS — Z79899 Other long term (current) drug therapy: Secondary | ICD-10-CM

## 2014-11-02 DIAGNOSIS — M7062 Trochanteric bursitis, left hip: Secondary | ICD-10-CM

## 2014-11-02 DIAGNOSIS — E1159 Type 2 diabetes mellitus with other circulatory complications: Secondary | ICD-10-CM

## 2014-11-02 DIAGNOSIS — E1169 Type 2 diabetes mellitus with other specified complication: Secondary | ICD-10-CM

## 2014-11-02 DIAGNOSIS — E785 Hyperlipidemia, unspecified: Secondary | ICD-10-CM | POA: Diagnosis not present

## 2014-11-02 DIAGNOSIS — E119 Type 2 diabetes mellitus without complications: Secondary | ICD-10-CM | POA: Diagnosis not present

## 2014-11-02 LAB — COMPREHENSIVE METABOLIC PANEL
ALK PHOS: 64 U/L (ref 39–117)
ALT: 20 U/L (ref 0–35)
AST: 23 U/L (ref 0–37)
Albumin: 4.3 g/dL (ref 3.5–5.2)
BUN: 14 mg/dL (ref 6–23)
CHLORIDE: 102 meq/L (ref 96–112)
CO2: 25 mEq/L (ref 19–32)
Calcium: 10.1 mg/dL (ref 8.4–10.5)
Creat: 0.78 mg/dL (ref 0.50–1.10)
Glucose, Bld: 91 mg/dL (ref 70–99)
POTASSIUM: 4.1 meq/L (ref 3.5–5.3)
Sodium: 140 mEq/L (ref 135–145)
Total Bilirubin: 0.5 mg/dL (ref 0.2–1.2)
Total Protein: 7.1 g/dL (ref 6.0–8.3)

## 2014-11-02 LAB — CBC WITH DIFFERENTIAL/PLATELET
BASOS ABS: 0 10*3/uL (ref 0.0–0.1)
Basophils Relative: 0 % (ref 0–1)
Eosinophils Absolute: 0.2 10*3/uL (ref 0.0–0.7)
Eosinophils Relative: 2 % (ref 0–5)
HCT: 40.2 % (ref 36.0–46.0)
Hemoglobin: 13.7 g/dL (ref 12.0–15.0)
Lymphocytes Relative: 23 % (ref 12–46)
Lymphs Abs: 1.8 10*3/uL (ref 0.7–4.0)
MCH: 28.7 pg (ref 26.0–34.0)
MCHC: 34.1 g/dL (ref 30.0–36.0)
MCV: 84.3 fL (ref 78.0–100.0)
MONO ABS: 0.5 10*3/uL (ref 0.1–1.0)
MPV: 10.3 fL (ref 8.6–12.4)
Monocytes Relative: 7 % (ref 3–12)
NEUTROS ABS: 5.2 10*3/uL (ref 1.7–7.7)
NEUTROS PCT: 68 % (ref 43–77)
PLATELETS: 207 10*3/uL (ref 150–400)
RBC: 4.77 MIL/uL (ref 3.87–5.11)
RDW: 13.6 % (ref 11.5–15.5)
WBC: 7.7 10*3/uL (ref 4.0–10.5)

## 2014-11-02 LAB — LIPID PANEL
CHOLESTEROL: 114 mg/dL (ref 0–200)
HDL: 56 mg/dL (ref >=46)
LDL Cholesterol: 28 mg/dL (ref 0–99)
Total CHOL/HDL Ratio: 2 Ratio
Triglycerides: 150 mg/dL — ABNORMAL HIGH (ref <150)
VLDL: 30 mg/dL (ref 0–40)

## 2014-11-02 LAB — POCT GLYCOSYLATED HEMOGLOBIN (HGB A1C): HEMOGLOBIN A1C: 5.8

## 2014-11-02 MED ORDER — SUMATRIPTAN SUCCINATE 100 MG PO TABS: 100.0000 mg | ORAL_TABLET | ORAL | Status: DC | PRN

## 2014-11-02 MED ORDER — TRIAMCINOLONE ACETONIDE 40 MG/ML IJ SUSP
40.0000 mg | Freq: Once | INTRAMUSCULAR | Status: AC
  Administered 2014-11-02: 40 mg via INTRAMUSCULAR

## 2014-11-02 MED ORDER — LIDOCAINE HCL (PF) 1 % IJ SOLN
2.0000 mL | Freq: Once | INTRAMUSCULAR | Status: AC
  Administered 2014-11-02: 2 mL via INTRADERMAL

## 2014-11-02 MED ORDER — VALSARTAN-HYDROCHLOROTHIAZIDE 320-12.5 MG PO TABS: ORAL_TABLET | ORAL | Status: DC

## 2014-11-02 NOTE — Progress Notes (Signed)
Subjective:    Patient ID: Sabrina Aguirre, female    DOB: 10-02-1940, 74 y.o.   MRN: [id number]  Sabrina Aguirre is a 74 y.o. female who presents for follow-up of Type 2 diabetes mellitus.she also complains of a several month history of left hip pain. This is interfering with her ability to exercise at the level that she would like. She also complains of snapping right third finger. She points to the middle of her palm for where the issue is.does have a history of migraine headaches but none recently. She also has a history of osteopenia and does keep himself quite physically active.  Home blood sugar records: Patient checks one time a day Current symptoms/problems None Daily foot checks:   Any foot concerns: none Exercise: walking EYES:11/02/13 The following portions of the patient's history were reviewed and updated as appropriate: allergies, current medications, past medical history, past social history and problem list.  ROS as in subjective above.     Objective:    Physical Exam Alert and in no distress him of the right hands does show a snapping sensation with the feeling being in the palm of the hand for the third finger. Good motion of the finger otherwise. Normal motion of the other fingers and wrist. Tender to palpation over the left greater trochanter. Full hip motion. Negative straight leg raising.  Weight 175 lb (79.379 kg).  Lab Review Diabetic Labs Latest Ref Rng 10/28/2013 05/19/2013 11/22/2012 03/27/2012 01/04/2012  HbA1c - 5.5 5.3 5.5 5.5 -  Chol 0 - 200 mg/dL - - 110 - -  HDL >39 mg/dL - - 62 - -  Calc LDL 0 - 99 mg/dL - - 28 - -  Triglycerides <150 mg/dL - - 100 - -  Creatinine 0.50 - 1.10 mg/dL - - 0.76 - 0.85   BP/Weight 11/02/2014 04/07/2014 02/24/2014 10/28/2013 3/71/0626  Systolic BP - 948 546 270 350  Diastolic BP - 98 90 093 818  Wt. (Lbs) 175 168 169 166 167  BMI 29.59 28.4 28.57 28.06 28.23   Foot/eye exam completion dates Latest Ref Rng 11/06/2013 05/19/2013  Eye  Exam No Retinopathy No Retinopathy -  Foot Form Completion - - Done  hemoglobin A1c is 5.8  Taria  reports that she has never smoked. She has never used smokeless tobacco. She reports that she does not drink alcohol or use illicit drugs.     Assessment & Plan:    Diabetes mellitus type 2, noninsulin dependent - Plan: POCT glycosylated hemoglobin (Hb A1C), CBC with Differential/Platelet, Comprehensive metabolic panel, Lipid panel, POCT UA - Microalbumin  Hyperlipidemia associated with type 2 diabetes mellitus - Plan: Lipid panel  Osteopenia of the elderly - Plan: CBC with Differential/Platelet, Comprehensive metabolic panel  Migraine without aura and without status migrainosus, not intractable  Hypertension associated with diabetes - Plan: CBC with Differential/Platelet, Comprehensive metabolic panel  Trochanteric bursitis of left hip  Trigger finger, acquired  Encounter for long-term (current) use of medications - Plan: CBC with Differential/Platelet, Comprehensive metabolic panel, Lipid panel the point of maximum pain was determined with palpation over the left greater trochanter. 40 mg of Kenalog and 3 mL of Xylocaine was injected into that area without difficulty. She obtained good relief of her symptoms relatively quickly. Explained that when her finger gives her more difficulty, I will refer her to a hand specialist for an injection.  1. Rx changes: none 2. Education: Reviewed 'ABCs' of diabetes management (respective goals in parentheses):  A1C (<7),  blood pressure (<130/80), and cholesterol (LDL <100). 3. Compliance at present is estimated to be excellent. Efforts to improve compliance (if necessary) will be directed at increased exercise. 4. Follow up: 4 months

## 2014-11-02 NOTE — Addendum Note (Signed)
Addended by: Randel Books on: 11/02/2014 01:51 PM   Modules accepted: Orders

## 2014-11-02 NOTE — Patient Instructions (Signed)
Trochanteric Bursitis You have hip pain due to trochanteric bursitis. Bursitis means that the sack near the outside of the hip is filled with fluid and inflamed. This sack is made up of protective soft tissue. The pain from trochanteric bursitis can be severe and keep you from sleep. It can radiate to the buttocks or down the outside of the thigh to the knee. The pain is almost always worse when rising from the seated or lying position and with walking. Pain can improve after you take a few steps. It happens more often in people with hip joint and lumbar spine problems, such as arthritis or previous surgery. Very rarely the trochanteric bursa can become infected, and antibiotics and/or surgery may be needed. Treatment often includes an injection of local anesthetic mixed with cortisone medicine. This medicine is injected into the area where it is most tender over the hip. Repeat injections may be necessary if the response to treatment is slow. You can apply ice packs over the tender area for 30 minutes every 2 hours for the next few days. Anti-inflammatory and/or narcotic pain medicine may also be helpful. Limit your activity for the next few days if the pain continues. See your caregiver in 5-10 days if you are not greatly improved.  SEEK IMMEDIATE MEDICAL CARE IF:  You develop severe pain, fever, or increased redness.  You have pain that radiates below the knee. EXERCISES STRETCHING EXERCISES - Trochanteric Bursitis  These exercises may help you when beginning to rehabilitate your injury. Your symptoms may resolve with or without further involvement from your physician, physical therapist, or athletic trainer. While completing these exercises, remember:   Restoring tissue flexibility helps normal motion to return to the joints. This allows healthier, less painful movement and activity.  An effective stretch should be held for at least 30 seconds.  A stretch should never be painful. You should only  feel a gentle lengthening or release in the stretched tissue. STRETCH - Iliotibial Band  On the floor or bed, lie on your side so your injured leg is on top. Bend your knee and grab your ankle.  Slowly bring your knee back so that your thigh is in line with your trunk. Keep your heel at your buttocks and gently arch your back so your head, shoulders and hips line up.  Slowly lower your leg so that your knee approaches the floor/bed until you feel a gentle stretch on the outside of your thigh. If you do not feel a stretch and your knee will not fall farther, place the heel of your opposite foot on top of your knee and pull your thigh down farther.  Hold this stretch for __________ seconds.  Repeat __________ times. Complete this exercise __________ times per day. STRETCH - Hamstrings, Supine   Lie on your back. Loop a belt or towel over the ball of your foot as shown.  Straighten your knee and slowly pull on the belt to raise your injured leg. Do not allow the knee to bend. Keep your opposite leg flat on the floor.  Raise the leg until you feel a gentle stretch behind your knee or thigh. Hold this position for __________ seconds.  Repeat __________ times. Complete this stretch __________ times per day. STRETCH - Quadriceps, Prone   Lie on your stomach on a firm surface, such as a bed or padded floor.  Bend your knee and grasp your ankle. If you are unable to reach your ankle or pant leg, use a belt   around your foot to lengthen your reach.  Gently pull your heel toward your buttocks. Your knee should not slide out to the side. You should feel a stretch in the front of your thigh and/or knee.  Hold this position for __________ seconds.  Repeat __________ times. Complete this stretch __________ times per day. STRETCHING - Hip Flexors, Lunge Half kneel with your knee on the floor and your opposite knee bent and directly over your ankle.  Keep good posture with your head over your  shoulders. Tighten your buttocks to point your tailbone downward; this will prevent your back from arching too much.  You should feel a gentle stretch in the front of your thigh and/or hip. If you do not feel any resistance, slightly slide your opposite foot forward and then slowly lunge forward so your knee once again lines up over your ankle. Be sure your tailbone remains pointed downward.  Hold this stretch for __________ seconds.  Repeat __________ times. Complete this stretch __________ times per day. STRETCH - Adductors, Lunge  While standing, spread your legs.  Lean away from your injured leg by bending your opposite knee. You may rest your hands on your thigh for balance.  You should feel a stretch in your inner thigh. Hold for __________ seconds.  Repeat __________ times. Complete this exercise __________ times per day. Document Released: 09/14/2004 Document Revised: 12/22/2013 Document Reviewed: 11/19/2008 [employer] Patient Information 2015 [employer]. This information is not intended to replace advice given to you by your health care provider. Make sure you discuss any questions you have with your health care provider.  

## 2014-12-28 ENCOUNTER — Ambulatory Visit (INDEPENDENT_AMBULATORY_CARE_PROVIDER_SITE_OTHER): Payer: Medicare PPO | Admitting: Family Medicine

## 2014-12-28 ENCOUNTER — Encounter: Payer: Self-pay | Admitting: Family Medicine

## 2014-12-28 VITALS — BP 128/80 | HR 88 | Temp 98.4°F | Wt 174.6 lb

## 2014-12-28 DIAGNOSIS — J0100 Acute maxillary sinusitis, unspecified: Secondary | ICD-10-CM | POA: Diagnosis not present

## 2014-12-28 DIAGNOSIS — J3089 Other allergic rhinitis: Secondary | ICD-10-CM

## 2014-12-28 DIAGNOSIS — R197 Diarrhea, unspecified: Secondary | ICD-10-CM

## 2014-12-28 MED ORDER — LEVOFLOXACIN 500 MG PO TABS
500.0000 mg | ORAL_TABLET | Freq: Every day | ORAL | Status: DC
Start: 2014-12-28 — End: 2015-03-19

## 2014-12-28 NOTE — Progress Notes (Signed)
   Subjective:    Patient ID: Sabrina Aguirre, female    DOB: 1941/06/05, 74 y.o.   MRN: [id number]  HPI She complains of an 8 day history this started with malaise, fatigue, fever, chills, dizziness, sore throat, rhinorrhea with nasal congestion and postnasal drainage of purulent discharge. She continues have difficulty with sneezing and itchy watery eyes. She is on Zyrtec. She has also had some slight diarrhea and occasional urge incontinence during the same timeframe.   Review of Systems     Objective:   Physical Exam Alert and in no distress. Nasal mucosa is red with tenderness over frontal and maxillary sinuses.Tympanic membranes and canals are normal. Pharyngeal area is normal. Neck is supple without adenopathy or thyromegaly. Cardiac exam shows a regular sinus rhythm without murmurs or gallops. Lungs are clear to auscultation.        Assessment & Plan:  Acute maxillary sinusitis, recurrence not specified - Plan: levofloxacin (LEVAQUIN) 500 MG tablet  Other allergic rhinitis  Diarrhea I will place her on Levaquin and avoid GI irritating antibiotics. No particular therapy for the diarrhea. Continue on her Zyrtec. Call if not entirely better when she finishes antibiotic.

## 2014-12-28 NOTE — Patient Instructions (Signed)
Keep taking the Aleve and the antibiotic and if you not totally back to normal after finishing the antibiotic give me a call

## 2014-12-29 ENCOUNTER — Telehealth: Payer: Self-pay

## 2014-12-29 MED ORDER — AZITHROMYCIN 500 MG PO TABS: 500.0000 mg | ORAL_TABLET | Freq: Every day | ORAL | Status: DC

## 2014-12-29 NOTE — Telephone Encounter (Signed)
Pt. was seen here yesterday 12/28/14, states she was prescribed Levaquin 500 mg. to take 1 for 11 days. Pt. States it caused diarrhea, left abdominal pain, dizziness.  She states that she has discontinued taking and request that her med be changed to Amoxicillin or Z Pack.

## 2014-12-29 NOTE — Telephone Encounter (Signed)
Let her know that I called the medication in. 

## 2015-01-29 ENCOUNTER — Other Ambulatory Visit: Payer: Self-pay | Admitting: Family Medicine

## 2015-01-29 NOTE — Telephone Encounter (Signed)
Is this okay?

## 2015-01-29 NOTE — Telephone Encounter (Signed)
Pt also called to have med refilled

## 2015-03-01 ENCOUNTER — Ambulatory Visit (INDEPENDENT_AMBULATORY_CARE_PROVIDER_SITE_OTHER): Payer: Medicare PPO | Admitting: Family Medicine

## 2015-03-01 ENCOUNTER — Encounter: Payer: Self-pay | Admitting: Family Medicine

## 2015-03-01 VITALS — BP 100/60 | HR 125 | Temp 101.0°F | Wt 169.0 lb

## 2015-03-01 DIAGNOSIS — J0100 Acute maxillary sinusitis, unspecified: Secondary | ICD-10-CM

## 2015-03-01 MED ORDER — AZITHROMYCIN 500 MG PO TABS: 500.0000 mg | ORAL_TABLET | Freq: Every day | ORAL | Status: DC

## 2015-03-01 NOTE — Patient Instructions (Signed)
Not totally back to normal in a week get the refill

## 2015-03-01 NOTE — Progress Notes (Signed)
   Subjective:    Patient ID: Sabrina Aguirre, female    DOB: 06-Aug-1941, 74 y.o.   MRN: [id number]  HPI She complains of a one-week history that started with chills, nasal congestion, anorexia with peripheral postnasal drainage, fatigue and headache. She has been using Aleve for relief of the headache. She has a previous history of difficulty with sinusitis and did respond well to azithromycin.   Review of Systems     Objective:   Physical Exam Alert and in no distress. Tympanic membranes and canals are normal. Pharyngeal area is normal. Neck is supple without adenopathy or thyromegaly. Cardiac exam shows a regular sinus rhythm without murmurs or gallops. Lungs are clear to auscultation.His mucosa is normal but tender over frontal and maxillary sinuses        Assessment & Plan:  Acute maxillary sinusitis, recurrence not specified - Plan: azithromycin (ZITHROMAX) 500 MG tablet I gave her a refill and explained that if she is still having symptoms after 1 week, to get the refill.

## 2015-03-03 ENCOUNTER — Ambulatory Visit: Payer: Medicare PPO | Admitting: Family Medicine

## 2015-03-04 ENCOUNTER — Telehealth: Payer: Self-pay

## 2015-03-04 MED ORDER — HYDROCOD POLST-CPM POLST ER 10-8 MG/5ML PO SUER: 5.0000 mL | Freq: Two times a day (BID) | ORAL | Status: DC | PRN

## 2015-03-04 NOTE — Telephone Encounter (Signed)
Pt is too sick to get up here, so she will get her daughter to come get the rx

## 2015-03-04 NOTE — Telephone Encounter (Signed)
Called to inform pt RX for cough ready to pick up

## 2015-03-04 NOTE — Telephone Encounter (Signed)
Dr.Lalonde Mrs.Dorthy called sounded very bad and asked if you could please call her in some cough med she said she is coughing now and she cant sleep and her body hurts so bad from the cough please advise

## 2015-03-16 ENCOUNTER — Other Ambulatory Visit: Payer: Self-pay

## 2015-03-16 DIAGNOSIS — Z1231 Encounter for screening mammogram for malignant neoplasm of breast: Secondary | ICD-10-CM

## 2015-03-19 ENCOUNTER — Emergency Department (HOSPITAL_COMMUNITY): Payer: Medicare PPO

## 2015-03-19 ENCOUNTER — Encounter (HOSPITAL_COMMUNITY): Payer: Self-pay | Admitting: Emergency Medicine

## 2015-03-19 ENCOUNTER — Inpatient Hospital Stay (HOSPITAL_COMMUNITY)
Admission: EM | Admit: 2015-03-19 | Discharge: 2015-03-22 | DRG: 872 | Disposition: A | Discharge status: Home / Self Care | Payer: Medicare PPO | Attending: Internal Medicine | Admitting: Internal Medicine

## 2015-03-19 DIAGNOSIS — T502X5A Adverse effect of carbonic-anhydrase inhibitors, benzothiadiazides and other diuretics, initial encounter: Secondary | ICD-10-CM | POA: Diagnosis present

## 2015-03-19 DIAGNOSIS — E119 Type 2 diabetes mellitus without complications: Secondary | ICD-10-CM | POA: Diagnosis present

## 2015-03-19 DIAGNOSIS — N390 Urinary tract infection, site not specified: Secondary | ICD-10-CM | POA: Diagnosis present

## 2015-03-19 DIAGNOSIS — K219 Gastro-esophageal reflux disease without esophagitis: Secondary | ICD-10-CM | POA: Diagnosis present

## 2015-03-19 DIAGNOSIS — I10 Essential (primary) hypertension: Secondary | ICD-10-CM | POA: Diagnosis present

## 2015-03-19 DIAGNOSIS — D649 Anemia, unspecified: Secondary | ICD-10-CM | POA: Diagnosis present

## 2015-03-19 DIAGNOSIS — R053 Chronic cough: Secondary | ICD-10-CM | POA: Diagnosis present

## 2015-03-19 DIAGNOSIS — Z90710 Acquired absence of both cervix and uterus: Secondary | ICD-10-CM

## 2015-03-19 DIAGNOSIS — B9620 Unspecified Escherichia coli [E. coli] as the cause of diseases classified elsewhere: Secondary | ICD-10-CM | POA: Diagnosis present

## 2015-03-19 DIAGNOSIS — R051 Acute cough: Secondary | ICD-10-CM | POA: Diagnosis present

## 2015-03-19 DIAGNOSIS — R05 Cough: Secondary | ICD-10-CM | POA: Diagnosis present

## 2015-03-19 DIAGNOSIS — Z8249 Family history of ischemic heart disease and other diseases of the circulatory system: Secondary | ICD-10-CM

## 2015-03-19 DIAGNOSIS — Z833 Family history of diabetes mellitus: Secondary | ICD-10-CM

## 2015-03-19 DIAGNOSIS — E876 Hypokalemia: Secondary | ICD-10-CM | POA: Diagnosis present

## 2015-03-19 DIAGNOSIS — R319 Hematuria, unspecified: Secondary | ICD-10-CM

## 2015-03-19 DIAGNOSIS — R079 Chest pain, unspecified: Secondary | ICD-10-CM | POA: Diagnosis not present

## 2015-03-19 DIAGNOSIS — A419 Sepsis, unspecified organism: Secondary | ICD-10-CM | POA: Diagnosis present

## 2015-03-19 DIAGNOSIS — R509 Fever, unspecified: Secondary | ICD-10-CM | POA: Diagnosis present

## 2015-03-19 LAB — COMPREHENSIVE METABOLIC PANEL
ALBUMIN: 3.6 g/dL (ref 3.5–5.0)
ALK PHOS: 62 U/L (ref 38–126)
ALT: 16 U/L (ref 14–54)
AST: 22 U/L (ref 15–41)
Anion gap: 10 (ref 5–15)
BILIRUBIN TOTAL: 1.5 mg/dL — AB (ref 0.3–1.2)
BUN: 9 mg/dL (ref 6–20)
CALCIUM: 9.5 mg/dL (ref 8.9–10.3)
CHLORIDE: 101 mmol/L (ref 101–111)
CO2: 23 mmol/L (ref 22–32)
CREATININE: 0.88 mg/dL (ref 0.44–1.00)
Glucose, Bld: 127 mg/dL — ABNORMAL HIGH (ref 65–99)
Potassium: 3.1 mmol/L — ABNORMAL LOW (ref 3.5–5.1)
SODIUM: 134 mmol/L — AB (ref 135–145)
TOTAL PROTEIN: 7.6 g/dL (ref 6.5–8.1)

## 2015-03-19 LAB — CBC WITH DIFFERENTIAL/PLATELET
Basophils Absolute: 0 10*3/uL (ref 0.0–0.1)
Basophils Relative: 0 % (ref 0–1)
EOS ABS: 0 10*3/uL (ref 0.0–0.7)
EOS PCT: 0 % (ref 0–5)
HCT: 38.3 % (ref 36.0–46.0)
Hemoglobin: 12.8 g/dL (ref 12.0–15.0)
LYMPHS PCT: 6 % — AB (ref 12–46)
Lymphs Abs: 1.3 10*3/uL (ref 0.7–4.0)
MCH: 28.4 pg (ref 26.0–34.0)
MCHC: 33.4 g/dL (ref 30.0–36.0)
MCV: 85.1 fL (ref 78.0–100.0)
Monocytes Absolute: 1.8 10*3/uL — ABNORMAL HIGH (ref 0.1–1.0)
Monocytes Relative: 8 % (ref 3–12)
NEUTROS ABS: 19 10*3/uL — AB (ref 1.7–7.7)
NEUTROS PCT: 86 % — AB (ref 43–77)
Platelets: 254 10*3/uL (ref 150–400)
RBC: 4.5 MIL/uL (ref 3.87–5.11)
RDW: 14.5 % (ref 11.5–15.5)
WBC: 22.1 10*3/uL — AB (ref 4.0–10.5)

## 2015-03-19 LAB — I-STAT TROPONIN, ED: TROPONIN I, POC: 0 ng/mL (ref 0.00–0.08)

## 2015-03-19 LAB — URINALYSIS, ROUTINE W REFLEX MICROSCOPIC
Bilirubin Urine: NEGATIVE
Glucose, UA: NEGATIVE mg/dL
KETONES UR: NEGATIVE mg/dL
Nitrite: POSITIVE — AB
Protein, ur: NEGATIVE mg/dL
Specific Gravity, Urine: 1.013 (ref 1.005–1.030)
Urobilinogen, UA: 0.2 mg/dL (ref 0.0–1.0)
pH: 6 (ref 5.0–8.0)

## 2015-03-19 LAB — URINE MICROSCOPIC-ADD ON

## 2015-03-19 MED ORDER — HYDROCOD POLST-CPM POLST ER 10-8 MG/5ML PO SUER: 5.0000 mL | Freq: Two times a day (BID) | ORAL | Status: DC | PRN

## 2015-03-19 MED ORDER — SODIUM CHLORIDE 0.9 % IV BOLUS (SEPSIS)
1000.0000 mL | Freq: Once | INTRAVENOUS | Status: AC
  Administered 2015-03-20: 1000 mL via INTRAVENOUS

## 2015-03-19 MED ORDER — ACETAMINOPHEN 325 MG PO TABS
650.0000 mg | ORAL_TABLET | Freq: Four times a day (QID) | ORAL | Status: DC | PRN
  Administered 2015-03-20: 650 mg via ORAL
  Filled 2015-03-19: qty 2

## 2015-03-19 MED ORDER — HEPARIN SODIUM (PORCINE) 5000 UNIT/ML IJ SOLN
5000.0000 [IU] | Freq: Three times a day (TID) | INTRAMUSCULAR | Status: DC
  Administered 2015-03-20 – 2015-03-21 (×6): 5000 [IU] via SUBCUTANEOUS
  Filled 2015-03-19 (×3): qty 1

## 2015-03-19 MED ORDER — ACETAMINOPHEN 325 MG PO TABS
ORAL_TABLET | ORAL | Status: AC
Start: 2015-03-19 — End: 2015-03-20
  Filled 2015-03-19: qty 2

## 2015-03-19 MED ORDER — PSYLLIUM 95 % PO PACK
1.0000 | PACK | Freq: Three times a day (TID) | ORAL | Status: DC
  Administered 2015-03-20 – 2015-03-22 (×8): 1 via ORAL
  Filled 2015-03-19 (×15): qty 1

## 2015-03-19 MED ORDER — METFORMIN HCL 500 MG PO TABS
500.0000 mg | ORAL_TABLET | Freq: Two times a day (BID) | ORAL | Status: DC
  Administered 2015-03-20 – 2015-03-22 (×5): 500 mg via ORAL
  Filled 2015-03-19 (×5): qty 1

## 2015-03-19 MED ORDER — ACETAMINOPHEN 325 MG PO TABS
650.0000 mg | ORAL_TABLET | Freq: Once | ORAL | Status: AC
  Administered 2015-03-19: 650 mg via ORAL

## 2015-03-19 MED ORDER — SODIUM CHLORIDE 0.9 % IV SOLN
INTRAVENOUS | Status: DC
  Administered 2015-03-20 – 2015-03-21 (×3): via INTRAVENOUS

## 2015-03-19 MED ORDER — ASPIRIN 81 MG PO CHEW
81.0000 mg | CHEWABLE_TABLET | Freq: Every day | ORAL | Status: DC
  Administered 2015-03-20 – 2015-03-22 (×3): 81 mg via ORAL
  Filled 2015-03-19 (×3): qty 1

## 2015-03-19 MED ORDER — DEXTROSE 5 % IV SOLN
1.0000 g | Freq: Once | INTRAVENOUS | Status: AC
  Administered 2015-03-20: 1 g via INTRAVENOUS
  Filled 2015-03-19: qty 10

## 2015-03-19 MED ORDER — DEXTROSE 5 % IV SOLN
1.0000 g | INTRAVENOUS | Status: DC
  Administered 2015-03-20 – 2015-03-21 (×2): 1 g via INTRAVENOUS
  Filled 2015-03-19 (×2): qty 10

## 2015-03-19 NOTE — ED Notes (Signed)
Pt st's she started feeling bad 7/4 with cough and pain in her chest with deep breathing and cough.   Productive cough (yellow).

## 2015-03-19 NOTE — ED Notes (Signed)
MD at bedside. 
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
## 2015-03-20 DIAGNOSIS — Z90710 Acquired absence of both cervix and uterus: Secondary | ICD-10-CM | POA: Diagnosis not present

## 2015-03-20 DIAGNOSIS — A419 Sepsis, unspecified organism: Secondary | ICD-10-CM | POA: Diagnosis present

## 2015-03-20 DIAGNOSIS — Z8249 Family history of ischemic heart disease and other diseases of the circulatory system: Secondary | ICD-10-CM | POA: Diagnosis not present

## 2015-03-20 DIAGNOSIS — R05 Cough: Secondary | ICD-10-CM | POA: Diagnosis present

## 2015-03-20 DIAGNOSIS — Z833 Family history of diabetes mellitus: Secondary | ICD-10-CM | POA: Diagnosis not present

## 2015-03-20 DIAGNOSIS — E119 Type 2 diabetes mellitus without complications: Secondary | ICD-10-CM

## 2015-03-20 DIAGNOSIS — I10 Essential (primary) hypertension: Secondary | ICD-10-CM | POA: Diagnosis present

## 2015-03-20 DIAGNOSIS — D649 Anemia, unspecified: Secondary | ICD-10-CM | POA: Diagnosis present

## 2015-03-20 DIAGNOSIS — N390 Urinary tract infection, site not specified: Secondary | ICD-10-CM | POA: Diagnosis present

## 2015-03-20 DIAGNOSIS — R509 Fever, unspecified: Secondary | ICD-10-CM | POA: Diagnosis present

## 2015-03-20 DIAGNOSIS — K219 Gastro-esophageal reflux disease without esophagitis: Secondary | ICD-10-CM | POA: Diagnosis present

## 2015-03-20 DIAGNOSIS — B9620 Unspecified Escherichia coli [E. coli] as the cause of diseases classified elsewhere: Secondary | ICD-10-CM | POA: Diagnosis present

## 2015-03-20 DIAGNOSIS — T502X5A Adverse effect of carbonic-anhydrase inhibitors, benzothiadiazides and other diuretics, initial encounter: Secondary | ICD-10-CM | POA: Diagnosis present

## 2015-03-20 DIAGNOSIS — E876 Hypokalemia: Secondary | ICD-10-CM | POA: Diagnosis present

## 2015-03-20 LAB — BASIC METABOLIC PANEL
Anion gap: 7 (ref 5–15)
BUN: 8 mg/dL (ref 6–20)
CALCIUM: 8.9 mg/dL (ref 8.9–10.3)
CO2: 25 mmol/L (ref 22–32)
CREATININE: 0.75 mg/dL (ref 0.44–1.00)
Chloride: 105 mmol/L (ref 101–111)
GFR calc Af Amer: >60 mL/min (ref >60)
GLUCOSE: 126 mg/dL — AB (ref 65–99)
Potassium: 2.8 mmol/L — ABNORMAL LOW (ref 3.5–5.1)
SODIUM: 137 mmol/L (ref 135–145)

## 2015-03-20 LAB — GLUCOSE, CAPILLARY
GLUCOSE-CAPILLARY: 146 mg/dL — AB (ref 65–99)
GLUCOSE-CAPILLARY: 153 mg/dL — AB (ref 65–99)
GLUCOSE-CAPILLARY: 96 mg/dL (ref 65–99)
Glucose-Capillary: 151 mg/dL — ABNORMAL HIGH (ref 65–99)

## 2015-03-20 LAB — CBC
HCT: 34.8 % — ABNORMAL LOW (ref 36.0–46.0)
Hemoglobin: 11.3 g/dL — ABNORMAL LOW (ref 12.0–15.0)
MCH: 27.7 pg (ref 26.0–34.0)
MCHC: 32.5 g/dL (ref 30.0–36.0)
MCV: 85.3 fL (ref 78.0–100.0)
Platelets: 207 10*3/uL (ref 150–400)
RBC: 4.08 MIL/uL (ref 3.87–5.11)
RDW: 14.6 % (ref 11.5–15.5)
WBC: 15 10*3/uL — AB (ref 4.0–10.5)

## 2015-03-20 MED ORDER — BENZONATATE 100 MG PO CAPS
200.0000 mg | ORAL_CAPSULE | Freq: Three times a day (TID) | ORAL | Status: DC
  Administered 2015-03-20 – 2015-03-22 (×7): 200 mg via ORAL
  Filled 2015-03-20 (×7): qty 2

## 2015-03-20 MED ORDER — POTASSIUM CHLORIDE CRYS ER 20 MEQ PO TBCR
40.0000 meq | EXTENDED_RELEASE_TABLET | Freq: Four times a day (QID) | ORAL | Status: AC
  Administered 2015-03-20 (×2): 40 meq via ORAL
  Filled 2015-03-20 (×2): qty 2

## 2015-03-20 MED ORDER — TRAMADOL HCL 50 MG PO TABS
50.0000 mg | ORAL_TABLET | Freq: Four times a day (QID) | ORAL | Status: DC | PRN
  Administered 2015-03-20 – 2015-03-21 (×4): 50 mg via ORAL
  Filled 2015-03-20 (×4): qty 1

## 2015-03-20 MED ORDER — DEXTROMETHORPHAN POLISTIREX ER 30 MG/5ML PO SUER
30.0000 mg | Freq: Two times a day (BID) | ORAL | Status: DC | PRN
  Administered 2015-03-20 – 2015-03-21 (×2): 30 mg via ORAL
  Filled 2015-03-20 (×5): qty 5

## 2015-03-20 NOTE — Progress Notes (Signed)
New Admission Note:  Arrival Method: Via stretcher with nurse Tech Mental Orientation: Alert &oriented x4 Telemetry: n/a Assessment: Completed Skin: dry and intact ER:QSXQ AC, normal saline infusing Pain: headache, 10/10, see MAR Tubes: N/A Safety Measures: Safety Fall Prevention Plan was given, discussed and signed. Admission: Completed [address] Orientation: Patient has been orientated to the room, unit and the staff. Family: Daughter at bedisde  Orders have been reviewed and implemented. Will continue to monitor the patient. Call light has been placed within reach and bed alarm has been activated.   Leandro Reasoner BSN, RN  Phone Number: [phone] [facility]

## 2015-03-20 NOTE — Progress Notes (Signed)
Home med (for cough) at bedside. Patient states daughter will take it home. Educated patient not to take any home medicine while in the hospital. Patient verbalized understanding.

## 2015-03-20 NOTE — Progress Notes (Signed)
[facility]                                                                              Patient Demographics  Sabrina Aguirre, is a 74 y.o. female, DOB - 1941/01/06, ZOX:[id number]  Admit date - 03/19/2015   Admitting Physician Hillary Bow, DO  Outpatient Primary MD for the patient is Carollee Herter, MD  LOS -    Chief Complaint  Patient presents with  . Chest Pain      HPI on 03/19/2015 by Dr. Lyda Perone Sabrina Aguirre is a 74 y.o. female presents to the ED with c/o cough for the past month. Per daughter she has been treated by her PCP with a course of azithromycin and cough suppressants without success. Today she began to feel worse, had some confusion, and so was brought in to the ED. In the ED, CXR was clear, she was noted to be febrile to 101.4, tachycardic to the 130s, have a WBC of 22k, and UA revealed a UTI.  Assessment & Plan   Sepsis secondary to UTI -Upon admission, patient was febrile with tachycardia and leukocytosis -UA: TNTC WBC, many bacteria, positive nitrites and large leukocytes -Pending urine culture -Continue ceftriaxone  Cough -Has been ongoing for approximately one month and unchanged -Patient has been using over-the-counter cough suppressants including prescription once with codeine with no improvement in her symptoms -Possibly secondary to Diovan HCT -Will order Tessalon Perles scheduled -Of note, patient did complete azithromycin and Levaquin as an outpatient -Chest x-ray shows no infection  Hypertension -Home medications held -Blood pressure currently stable, will continue to monitor and add on medications if needed  Diabetes mellitus, type II -Continue metformin with CBG monitoring  Abdominal pain -Secondary to coughing, will place patient on tramadol  Hypokalemia -Will replace and continue to monitor -will obtain magnesium level  Code Status: Full  Family Communication: Not at bedside  Disposition Plan: Admitted,  pending urine culture results, likely discharge 03/21/2015  Time Spent in minutes   30 minutes  Procedures  None  Consults   None  DVT Prophylaxis  heparin  Lab Results  Component Value Date   PLT 207 03/20/2015    Medications  Scheduled Meds: . aspirin  81 mg Oral Daily  . benzonatate  200 mg Oral TID  . cefTRIAXone (ROCEPHIN)  IV  1 g Intravenous Q24H  . heparin  5,000 Units Subcutaneous 3 times per day  . metFORMIN  500 mg Oral BID WC  . potassium chloride  40 mEq Oral Q6H  . psyllium  1 packet Oral TID   Continuous Infusions: . sodium chloride 75 mL/hr at 03/20/15 0224   PRN Meds:.acetaminophen, dextromethorphan, traMADol  Antibiotics    Anti-infectives    Start     Dose/Rate Route Frequency Ordered Stop   03/20/15 2315  cefTRIAXone (ROCEPHIN) 1 g in dextrose 5 % 50 mL IVPB     1 g 100 mL/hr over 30 Minutes Intravenous Every 24 hours 03/19/15 2332     03/19/15 2315  cefTRIAXone (ROCEPHIN) 1 g in dextrose 5 % 50 mL IVPB     1 g 100 mL/hr over 30 Minutes Intravenous  Once 03/19/15  2311 03/20/15 0039        Subjective:   Sabrina Aguirre seen and examined today.  Patient continues to complain of cough with no sputum production. Complains of abdominal pain. Denies any chest pain or shortness of breath, nausea or vomiting, diarrhea or constipation.  Objective:   Filed Vitals:   03/20/15 0100 03/20/15 0223 03/20/15 0500 03/20/15 0740  BP: 134/78 125/70 110/70 128/71  Pulse: 115 109 99 108  Temp: 98.5 F (36.9 C)  98.2 F (36.8 C) 98.7 F (37.1 C)  TempSrc: Oral  Oral Oral  Resp: 18  16 16   Height: 5\' 4"  (1.626 m)     Weight: 75.206 kg (165 lb 12.8 oz)     SpO2: 100%  96% 96%    Wt Readings from Last 3 Encounters:  03/20/15 75.206 kg (165 lb 12.8 oz)  03/01/15 76.658 kg (169 lb)  12/28/14 79.198 kg (174 lb 9.6 oz)     Intake/Output Summary (Last 24 hours) at 03/20/15 1051 Last data filed at 03/20/15 0600  Gross per 24 hour  Intake 1668.75 ml    Output    800 ml  Net 868.75 ml    Exam  General: Well developed, well nourished, no distress  HEENT: NCAT, mucous membranes moist.   Cardiovascular: S1 S2 auscultated, no rubs, murmurs or gallops. Regular rate and rhythm.  Respiratory: Clear to auscultation, no wheezes, occasional dry cough  Abdomen: Soft, nontender, nondistended, + bowel sounds  Extremities: warm dry without cyanosis clubbing or edema  Neuro: AAOx3, nonfocal  Psych: Appropriate mood and affect  Data Review   Micro Results No results found for this or any previous visit (from the past 240 hour(s)).  Radiology Reports Dg Chest 2 View  03/19/2015   CLINICAL DATA:  Midsternal chest pain, productive cough, headache and weakness.  EXAM: CHEST  2 VIEW  COMPARISON:  11/09/2008  FINDINGS: There is unchanged right hemidiaphragm elevation. Heart size is normal. There is mild unchanged aortic tortuosity. The lungs are clear. There are no effusions. The pulmonary vasculature is normal.  IMPRESSION: No active cardiopulmonary disease.   Electronically Signed   By: Ellery Plunk M.D.   On: 03/19/2015 20:46    CBC  Recent Labs Lab 03/19/15 2008 03/20/15 0459  WBC 22.1* 15.0*  HGB 12.8 11.3*  HCT 38.3 34.8*  PLT 254 207  MCV 85.1 85.3  MCH 28.4 27.7  MCHC 33.4 32.5  RDW 14.5 14.6  LYMPHSABS 1.3  --   MONOABS 1.8*  --   EOSABS 0.0  --   BASOSABS 0.0  --     Chemistries   Recent Labs Lab 03/19/15 2008 03/20/15 0459  NA 134* 137  K 3.1* 2.8*  CL 101 105  CO2 23 25  GLUCOSE 127* 126*  BUN 9 8  CREATININE 0.88 0.75  CALCIUM 9.5 8.9  AST 22  --   ALT 16  --   ALKPHOS 62  --   BILITOT 1.5*  --    ------------------------------------------------------------------------------------------------------------------ estimated creatinine clearance is 60.3 mL/min (by C-G formula based on Cr of  0.75). ------------------------------------------------------------------------------------------------------------------ No results for input(s): HGBA1C in the last 72 hours. ------------------------------------------------------------------------------------------------------------------ No results for input(s): CHOL, HDL, LDLCALC, TRIG, CHOLHDL, LDLDIRECT in the last 72 hours. ------------------------------------------------------------------------------------------------------------------ No results for input(s): TSH, T4TOTAL, T3FREE, THYROIDAB in the last 72 hours.  Invalid input(s): FREET3 ------------------------------------------------------------------------------------------------------------------ No results for input(s): VITAMINB12, FOLATE, FERRITIN, TIBC, IRON, RETICCTPCT in the last 72 hours.  Coagulation profile No results for input(s): INR,  PROTIME in the last 168 hours.  No results for input(s): DDIMER in the last 72 hours.  Cardiac Enzymes No results for input(s): CKMB, TROPONINI, MYOGLOBIN in the last 168 hours.  Invalid input(s): CK ------------------------------------------------------------------------------------------------------------------ Invalid input(s): POCBNP    Sabrina Aguirre D.O. on 03/20/2015 at 10:51 AM  Between 7am to 7pm - Pager - [phone]  After 7pm go to [email or web address] - password [email or web address]  And look for the night coverage person covering for me after hours  [facility] Office  [phone]

## 2015-03-21 LAB — MAGNESIUM: MAGNESIUM: 1.7 mg/dL (ref 1.7–2.4)

## 2015-03-21 LAB — BASIC METABOLIC PANEL
Anion gap: 7 (ref 5–15)
BUN: <5 mg/dL — ABNORMAL LOW (ref 6–20)
CHLORIDE: 105 mmol/L (ref 101–111)
CO2: 24 mmol/L (ref 22–32)
Calcium: 8.5 mg/dL — ABNORMAL LOW (ref 8.9–10.3)
Creatinine, Ser: 0.73 mg/dL (ref 0.44–1.00)
GFR calc Af Amer: >60 mL/min (ref >60)
Glucose, Bld: 105 mg/dL — ABNORMAL HIGH (ref 65–99)
POTASSIUM: 3.5 mmol/L (ref 3.5–5.1)
SODIUM: 136 mmol/L (ref 135–145)

## 2015-03-21 LAB — IRON AND TIBC
IRON: 14 ug/dL — AB (ref 28–170)
SATURATION RATIOS: 7 % — AB (ref 10.4–31.8)
TIBC: 207 ug/dL — ABNORMAL LOW (ref 250–450)
UIBC: 193 ug/dL

## 2015-03-21 LAB — CBC
HCT: 32.8 % — ABNORMAL LOW (ref 36.0–46.0)
HEMOGLOBIN: 10.8 g/dL — AB (ref 12.0–15.0)
MCH: 28.5 pg (ref 26.0–34.0)
MCHC: 32.9 g/dL (ref 30.0–36.0)
MCV: 86.5 fL (ref 78.0–100.0)
Platelets: 176 10*3/uL (ref 150–400)
RBC: 3.79 MIL/uL — ABNORMAL LOW (ref 3.87–5.11)
RDW: 14.7 % (ref 11.5–15.5)
WBC: 9.3 10*3/uL (ref 4.0–10.5)

## 2015-03-21 LAB — FERRITIN: FERRITIN: 379 ng/mL — AB (ref 11–307)

## 2015-03-21 LAB — RETICULOCYTES
RBC.: 3.85 MIL/uL — ABNORMAL LOW (ref 3.87–5.11)
RETIC CT PCT: 0.5 % (ref 0.4–3.1)
Retic Count, Absolute: 19.3 10*3/uL (ref 19.0–186.0)

## 2015-03-21 LAB — GLUCOSE, CAPILLARY
GLUCOSE-CAPILLARY: 95 mg/dL (ref 65–99)
GLUCOSE-CAPILLARY: 87 mg/dL (ref 65–99)

## 2015-03-21 LAB — VITAMIN B12: Vitamin B-12: 1078 pg/mL — ABNORMAL HIGH (ref 180–914)

## 2015-03-21 LAB — FOLATE: FOLATE: 25.8 ng/mL (ref >5.9)

## 2015-03-21 MED ORDER — LORATADINE 10 MG PO TABS
10.0000 mg | ORAL_TABLET | Freq: Every day | ORAL | Status: DC
  Administered 2015-03-21 – 2015-03-22 (×2): 10 mg via ORAL
  Filled 2015-03-21 (×2): qty 1

## 2015-03-21 MED ORDER — FAMOTIDINE 20 MG PO TABS
20.0000 mg | ORAL_TABLET | Freq: Two times a day (BID) | ORAL | Status: DC
  Administered 2015-03-21 (×2): 20 mg via ORAL
  Filled 2015-03-21 (×2): qty 1

## 2015-03-21 MED ORDER — FERROUS SULFATE 325 (65 FE) MG PO TABS
325.0000 mg | ORAL_TABLET | Freq: Two times a day (BID) | ORAL | Status: DC
  Administered 2015-03-21 – 2015-03-22 (×2): 325 mg via ORAL
  Filled 2015-03-21 (×2): qty 1

## 2015-03-21 NOTE — Progress Notes (Signed)
[facility]                                                                              Patient Demographics  Sabrina Aguirre, is a 74 y.o. female, DOB - 1941/04/18, ZOX:[id number]  Admit date - 03/19/2015   Admitting Physician Hillary Bow, DO  Outpatient Primary MD for the patient is Carollee Herter, MD  LOS - 1   Chief Complaint  Patient presents with  . Chest Pain      HPI on 03/19/2015 by Dr. Lyda Perone Sabrina Aguirre is a 74 y.o. female presents to the ED with c/o cough for the past month. Per daughter she has been treated by her PCP with a course of azithromycin and cough suppressants without success. Today she began to feel worse, had some confusion, and so was brought in to the ED. In the ED, CXR was clear, she was noted to be febrile to 101.4, tachycardic to the 130s, have a WBC of 22k, and UA revealed a UTI.  Assessment & Plan   Sepsis secondary to UTI -Upon admission, patient was febrile with tachycardia and leukocytosis -Leukocytosis resolved, patient afebrile -UA: TNTC WBC, many bacteria, positive nitrites and large leukocytes -Pending urine culture -Continue ceftriaxone  Cough -Has been ongoing for approximately one month and unchanged -Patient has been using over-the-counter cough suppressants including prescription once with codeine with no improvement in her symptoms -Possibly secondary to Diovan HCT vs GERD vs postnasal drip or allergies -Continue Tessalon TID -Of note, patient did complete azithromycin and Levaquin as an outpatient -Chest x-ray shows no infection -Will add on claritin and pepcid  Hypertension -Home medications held -Blood pressure currently stable, will continue to monitor and add on medications if needed  Diabetes mellitus, type II -Continue metformin with CBG monitoring  Abdominal pain -Secondary to coughing, continue tramadol  Hypokalemia -Resolved, Will replace and continue to monitor -Magnesium  1.7  Normocytic Anemia -Possibly secondary to dilutional component -Hb 10.8, continue to monitor CBC -FOBT ordered -Anemia panel: Iron 14, Ferritin 379 -Will start on iron supplementation  Code Status: Full  Family Communication: Daughter at bedside  Disposition Plan: Admitted, pending urine culture results, likely discharge 03/22/2015  Time Spent in minutes   30 minutes  Procedures  None  Consults   None  DVT Prophylaxis  heparin  Lab Results  Component Value Date   PLT 176 03/21/2015    Medications  Scheduled Meds: . aspirin  81 mg Oral Daily  . benzonatate  200 mg Oral TID  . cefTRIAXone (ROCEPHIN)  IV  1 g Intravenous Q24H  . famotidine  20 mg Oral BID  . heparin  5,000 Units Subcutaneous 3 times per day  . loratadine  10 mg Oral Daily  . metFORMIN  500 mg Oral BID WC  . psyllium  1 packet Oral TID   Continuous Infusions: . sodium chloride 75 mL/hr at 03/21/15 0506   PRN Meds:.acetaminophen, dextromethorphan, traMADol  Antibiotics    Anti-infectives    Start     Dose/Rate Route Frequency Ordered Stop   03/20/15 2315  cefTRIAXone (ROCEPHIN) 1 g in dextrose 5 % 50 mL IVPB     1  g 100 mL/hr over 30 Minutes Intravenous Every 24 hours 03/19/15 2332     03/19/15 2315  cefTRIAXone (ROCEPHIN) 1 g in dextrose 5 % 50 mL IVPB     1 g 100 mL/hr over 30 Minutes Intravenous  Once 03/19/15 2311 03/20/15 0039        Subjective:   Tienna Alami seen and examined today.  Patient continues to complain of dry cough, with mild improvement.  Complains of abdominal pain. Denies any chest pain or shortness of breath, nausea or vomiting, diarrhea or constipation.  Objective:   Filed Vitals:   03/20/15 1741 03/20/15 2148 03/21/15 0522 03/21/15 0815  BP: 135/88 134/80 127/78 124/80  Pulse: 105 109 94 90  Temp: 98.1 F (36.7 C) 99.4 F (37.4 C) 100.3 F (37.9 C) 98 F (36.7 C)  TempSrc: Oral   Oral  Resp: 16 18 17 18   Height:      Weight:  78.926 kg (174 lb)     SpO2: 96% 98% 97% 98%    Wt Readings from Last 3 Encounters:  03/20/15 78.926 kg (174 lb)  03/01/15 76.658 kg (169 lb)  12/28/14 79.198 kg (174 lb 9.6 oz)     Intake/Output Summary (Last 24 hours) at 03/21/15 1035 Last data filed at 03/21/15 0940  Gross per 24 hour  Intake   1730 ml  Output      0 ml  Net   1730 ml    Exam  General: Well developed, well nourished, no distress  HEENT: NCAT, mucous membranes moist.   Cardiovascular: S1 S2 auscultated, RRR, no murmurs  Respiratory: Clear to auscultation, no wheezes, occasional dry cough-improved  Abdomen: Soft, nontender, nondistended, + bowel sounds  Extremities: warm dry without cyanosis clubbing or edema  Neuro: AAOx3, nonfocal  Psych: Appropriate mood and affect  Data Review   Micro Results No results found for this or any previous visit (from the past 240 hour(s)).  Radiology Reports Dg Chest 2 View  03/19/2015   CLINICAL DATA:  Midsternal chest pain, productive cough, headache and weakness.  EXAM: CHEST  2 VIEW  COMPARISON:  11/09/2008  FINDINGS: There is unchanged right hemidiaphragm elevation. Heart size is normal. There is mild unchanged aortic tortuosity. The lungs are clear. There are no effusions. The pulmonary vasculature is normal.  IMPRESSION: No active cardiopulmonary disease.   Electronically Signed   By: Ellery Plunk M.D.   On: 03/19/2015 20:46    CBC  Recent Labs Lab 03/19/15 2008 03/20/15 0459 03/21/15 0425  WBC 22.1* 15.0* 9.3  HGB 12.8 11.3* 10.8*  HCT 38.3 34.8* 32.8*  PLT 254 207 176  MCV 85.1 85.3 86.5  MCH 28.4 27.7 28.5  MCHC 33.4 32.5 32.9  RDW 14.5 14.6 14.7  LYMPHSABS 1.3  --   --   MONOABS 1.8*  --   --   EOSABS 0.0  --   --   BASOSABS 0.0  --   --     Chemistries   Recent Labs Lab 03/19/15 2008 03/20/15 0459 03/21/15 0425  NA 134* 137 136  K 3.1* 2.8* 3.5  CL 101 105 105  CO2 23 25 24   GLUCOSE 127* 126* 105*  BUN 9 8 <5*  CREATININE 0.88 0.75 0.73   CALCIUM 9.5 8.9 8.5*  MG  --   --  1.7  AST 22  --   --   ALT 16  --   --   ALKPHOS 62  --   --  BILITOT 1.5*  --   --    ------------------------------------------------------------------------------------------------------------------ estimated creatinine clearance is 61.8 mL/min (by C-G formula based on Cr of 0.73). ------------------------------------------------------------------------------------------------------------------ No results for input(s): HGBA1C in the last 72 hours. ------------------------------------------------------------------------------------------------------------------ No results for input(s): CHOL, HDL, LDLCALC, TRIG, CHOLHDL, LDLDIRECT in the last 72 hours. ------------------------------------------------------------------------------------------------------------------ No results for input(s): TSH, T4TOTAL, T3FREE, THYROIDAB in the last 72 hours.  Invalid input(s): FREET3 ------------------------------------------------------------------------------------------------------------------  Recent Labs  03/21/15 0816  VITAMINB12 1078*  FERRITIN 379*  TIBC 207*  IRON 14*  RETICCTPCT 0.5    Coagulation profile No results for input(s): INR, PROTIME in the last 168 hours.  No results for input(s): DDIMER in the last 72 hours.  Cardiac Enzymes No results for input(s): CKMB, TROPONINI, MYOGLOBIN in the last 168 hours.  Invalid input(s): CK ------------------------------------------------------------------------------------------------------------------ Invalid input(s): POCBNP    Jami Bogdanski D.O. on 03/21/2015 at 10:35 AM  Between 7am to 7pm - Pager - [phone]  After 7pm go to [email or web address] - password [email or web address]  And look for the night coverage person covering for me after hours  [facility] Office  [phone]

## 2015-03-21 NOTE — Discharge Instructions (Signed)

## 2015-03-21 NOTE — Discharge Summary (Signed)
Physician Discharge Summary  Sabrina Aguirre NFA:[id number] DOB: 1940/09/25 DOA: 03/19/2015  PCP: Carollee Herter, MD  Admit date: 03/19/2015 Discharge date: 03/22/2015  Time spent: 45 minutes  Recommendations for Outpatient Follow-up:  Patient will be discharged to home.  Patient will need to follow up with primary care provider within one week of discharge.  Patient should continue medications as prescribed.  Patient should follow a heart healthy/carb modified diet.   Discharge Diagnoses:  Sepsis secondary to UTI Cough Hypertension Diabetes mellitus, type II Abdominal pain Hypokalemia Normocytic anemia  Discharge Condition: Stable  Diet recommendation: Heart healthy/carb modified  Filed Weights   03/19/15 2001 03/20/15 0100 03/20/15 2148  Weight: 76.658 kg (169 lb) 75.206 kg (165 lb 12.8 oz) 78.926 kg (174 lb)    History of present illness:  on 03/19/2015 by Dr. Lyda Perone Sabrina Aguirre is a 74 y.o. female presents to the ED with c/o cough for the past month. Per daughter she has been treated by her PCP with a course of azithromycin and cough suppressants without success. Today she began to feel worse, had some confusion, and so was brought in to the ED. In the ED, CXR was clear, she was noted to be febrile to 101.4, tachycardic to the 130s, have a WBC of 22k, and UA revealed a UTI.  Hospital Course:  Sepsis secondary to UTI -Upon admission, patient was febrile with tachycardia and leukocytosis -Leukocytosis resolved, patient afebrile -UA: TNTC WBC, many bacteria, positive nitrites and large leukocytes -Urine culture: >100K Ecoli -Was placed on ceftriaxone.  Will discharge patient on Ceftin 500mg  BID.  Cough -Has been ongoing for approximately one month and unchanged -Patient has been using over-the-counter cough suppressants including prescription once with codeine with no improvement in her symptoms -Possibly secondary to Diovan HCT vs GERD vs postnasal drip or  allergies -Continue Tessalon TID -Of note, patient did complete azithromycin and Levaquin as an outpatient -Chest x-ray shows no infection -Continue claritin and pepcid  Hypertension -Home medications held secondary to cough -Blood pressure currently stable, will continue to monitor and add on medications if needed -Will discharge patient with HCTZ -Patient will need to follow up with her PCP for BP control.  Diabetes mellitus, type II -Continue metformin with CBG monitoring  Abdominal pain -Secondary to coughing, continue tramadol  Hypokalemia -Resolved, Will replace and continue to monitor -Magnesium 1.7  Normocytic Anemia -Possibly secondary to dilutional component -Hb 10.0 -Anemia panel: Iron 14, Ferritin 379 -Continue iron supplementation  Procedures  None  Consults  None  Discharge Exam: Filed Vitals:   03/22/15 0737  BP: 135/84  Pulse: 95  Temp: 98.5 F (36.9 C)  Resp: 18   Exam  General: Well developed, well nourished, no distress  HEENT: NCAT, mucous membranes moist.   Cardiovascular: S1 S2 auscultated, RRR, no murmurs  Respiratory: Clear to auscultation, no wheezes, occasional dry cough-improved  Abdomen: Soft, nontender, nondistended, + bowel sounds  Extremities: warm dry without cyanosis clubbing or edema  Neuro: AAOx3, nonfocal  Psych: Appropriate mood and affect, pleasant  Discharge Instructions      Discharge Instructions    Discharge instructions    Complete by:  As directed   Patient will be discharged to home.  Patient will need to follow up with primary care provider within one week of discharge.  Patient should continue medications as prescribed.  Patient should follow a heart healthy/carb modified diet.            Medication List  STOP taking these medications        valsartan-hydrochlorothiazide 320-12.5 MG per tablet  Commonly known as:  DIOVAN-HCT      TAKE these medications        aspirin 81 MG tablet    Take 81 mg by mouth daily.     benzonatate 200 MG capsule  Commonly known as:  TESSALON  Take 1 capsule (200 mg total) by mouth 3 (three) times daily.     cefUROXime 500 MG tablet  Commonly known as:  CEFTIN  Take 1 tablet (500 mg total) by mouth 2 (two) times daily with a meal.     Cinnamon 500 MG Tabs  Take 1 tablet by mouth daily.     dextromethorphan 30 MG/5ML liquid  Commonly known as:  DELSYM  Take 5 mLs (30 mg total) by mouth 2 (two) times daily as needed for cough.     famotidine 20 MG tablet  Commonly known as:  PEPCID  Take 1 tablet (20 mg total) by mouth 2 (two) times daily.     ferrous sulfate 325 (65 FE) MG tablet  Take 1 tablet (325 mg total) by mouth 2 (two) times daily with a meal.     fish oil-omega-3 fatty acids 1000 MG capsule  Take 2 g by mouth daily.     hydrochlorothiazide 12.5 MG tablet  Commonly known as:  HYDRODIURIL  Take 1 tablet (12.5 mg total) by mouth daily.     loratadine 10 MG tablet  Commonly known as:  CLARITIN  Take 1 tablet (10 mg total) by mouth daily.     metFORMIN 500 MG tablet  Commonly known as:  GLUCOPHAGE  TAKE ONE TABLET BY MOUTH TWICE DAILY WITH MEALS     SUMAtriptan 100 MG tablet  Commonly known as:  IMITREX  Take 1 tablet (100 mg total) by mouth every 2 (two) hours as needed.     vitamin B-12 1000 MCG tablet  Commonly known as:  CYANOCOBALAMIN  Take 1,000 mcg by mouth daily.     vitamin C 1000 MG tablet  Take 1,000 mg by mouth daily.       No Known Allergies Follow-up Information    Follow up with Carollee Herter, MD. Schedule an appointment as soon as possible for a visit in 1 week.   Specialty:  Family Medicine   Why:  Hospital follow-up   Contact information:   [address] [place] [address] [phone]        The results of significant diagnostics from this hospitalization (including imaging, microbiology, ancillary and laboratory) are listed below for reference.    Significant  Diagnostic Studies: Dg Chest 2 View  03/19/2015   CLINICAL DATA:  Midsternal chest pain, productive cough, headache and weakness.  EXAM: CHEST  2 VIEW  COMPARISON:  11/09/2008  FINDINGS: There is unchanged right hemidiaphragm elevation. Heart size is normal. There is mild unchanged aortic tortuosity. The lungs are clear. There are no effusions. The pulmonary vasculature is normal.  IMPRESSION: No active cardiopulmonary disease.   Electronically Signed   By: Ellery Plunk M.D.   On: 03/19/2015 20:46    Microbiology: Recent Results (from the past 240 hour(s))  Urine culture     Status: None   Collection Time: 03/19/15 10:14 PM  Result Value Ref Range Status   Specimen Description URINE, CATHETERIZED  Final   Special Requests CX ADDED AT 0252 ON 213086  Final   Culture >=100,000 COLONIES/mL ESCHERICHIA COLI  Final  Report Status 03/22/2015 FINAL  Final   Organism ID, Bacteria ESCHERICHIA COLI  Final      Susceptibility   Escherichia coli - MIC*    AMPICILLIN <=2 SENSITIVE Sensitive     CEFAZOLIN <=4 SENSITIVE Sensitive     CEFTRIAXONE <=1 SENSITIVE Sensitive     CIPROFLOXACIN <=0.25 SENSITIVE Sensitive     GENTAMICIN <=1 SENSITIVE Sensitive     IMIPENEM <=0.25 SENSITIVE Sensitive     NITROFURANTOIN <=16 SENSITIVE Sensitive     TRIMETH/SULFA <=20 SENSITIVE Sensitive     AMPICILLIN/SULBACTAM <=2 SENSITIVE Sensitive     PIP/TAZO <=4 SENSITIVE Sensitive     * >=100,000 COLONIES/mL ESCHERICHIA COLI     Labs: Basic Metabolic Panel:  Recent Labs Lab 03/19/15 2008 03/20/15 0459 03/21/15 0425 03/22/15 0450  NA 134* 137 136 137  K 3.1* 2.8* 3.5 3.6  CL 101 105 105 109  CO2 23 25 24 23   GLUCOSE 127* 126* 105* 100*  BUN 9 8 <5* 6  CREATININE 0.88 0.75 0.73 0.70  CALCIUM 9.5 8.9 8.5* 8.5*  MG  --   --  1.7  --    Liver Function Tests:  Recent Labs Lab 03/19/15 2008  AST 22  ALT 16  ALKPHOS 62  BILITOT 1.5*  PROT 7.6  ALBUMIN 3.6   No results for input(s): LIPASE,  AMYLASE in the last 168 hours. No results for input(s): AMMONIA in the last 168 hours. CBC:  Recent Labs Lab 03/19/15 2008 03/20/15 0459 03/21/15 0425 03/22/15 0450  WBC 22.1* 15.0* 9.3 6.1  NEUTROABS 19.0*  --   --   --   HGB 12.8 11.3* 10.8* 10.0*  HCT 38.3 34.8* 32.8* 31.1*  MCV 85.1 85.3 86.5 86.9  PLT 254 207 176 175   Cardiac Enzymes: No results for input(s): CKTOTAL, CKMB, CKMBINDEX, TROPONINI in the last 168 hours. BNP: BNP (last 3 results) No results for input(s): BNP in the last 8760 hours.  ProBNP (last 3 results) No results for input(s): PROBNP in the last 8760 hours.  CBG:  Recent Labs Lab 03/20/15 1635 03/20/15 2146 03/21/15 0520 03/21/15 2205 03/22/15 0736  GLUCAP 153* 96 95 87 105*       Signed:  Sherley Mckenney  [facility] 03/22/2015, 10:15 AM

## 2015-03-22 ENCOUNTER — Ambulatory Visit: Payer: Medicare PPO | Admitting: Family Medicine

## 2015-03-22 LAB — BASIC METABOLIC PANEL
Anion gap: 5 (ref 5–15)
BUN: 6 mg/dL (ref 6–20)
CO2: 23 mmol/L (ref 22–32)
CREATININE: 0.7 mg/dL (ref 0.44–1.00)
Calcium: 8.5 mg/dL — ABNORMAL LOW (ref 8.9–10.3)
Chloride: 109 mmol/L (ref 101–111)
GFR calc non Af Amer: >60 mL/min (ref >60)
Glucose, Bld: 100 mg/dL — ABNORMAL HIGH (ref 65–99)
Potassium: 3.6 mmol/L (ref 3.5–5.1)
SODIUM: 137 mmol/L (ref 135–145)

## 2015-03-22 LAB — URINE CULTURE: Culture: >=100000

## 2015-03-22 LAB — CBC
HCT: 31.1 % — ABNORMAL LOW (ref 36.0–46.0)
Hemoglobin: 10 g/dL — ABNORMAL LOW (ref 12.0–15.0)
MCH: 27.9 pg (ref 26.0–34.0)
MCHC: 32.2 g/dL (ref 30.0–36.0)
MCV: 86.9 fL (ref 78.0–100.0)
PLATELETS: 175 10*3/uL (ref 150–400)
RBC: 3.58 MIL/uL — ABNORMAL LOW (ref 3.87–5.11)
RDW: 14.4 % (ref 11.5–15.5)
WBC: 6.1 10*3/uL (ref 4.0–10.5)

## 2015-03-22 LAB — GLUCOSE, CAPILLARY
GLUCOSE-CAPILLARY: 105 mg/dL — AB (ref 65–99)
Glucose-Capillary: 125 mg/dL — ABNORMAL HIGH (ref 65–99)

## 2015-03-22 MED ORDER — DEXTROMETHORPHAN POLISTIREX ER 30 MG/5ML PO SUER: 30.0000 mg | Freq: Two times a day (BID) | ORAL | Status: DC | PRN

## 2015-03-22 MED ORDER — CEFUROXIME AXETIL 500 MG PO TABS: 500.0000 mg | ORAL_TABLET | Freq: Two times a day (BID) | ORAL | Status: DC

## 2015-03-22 MED ORDER — LORATADINE 10 MG PO TABS: 10.0000 mg | ORAL_TABLET | Freq: Every day | ORAL | Status: DC

## 2015-03-22 MED ORDER — FERROUS SULFATE 325 (65 FE) MG PO TABS: 325.0000 mg | ORAL_TABLET | Freq: Two times a day (BID) | ORAL | Status: DC

## 2015-03-22 MED ORDER — FAMOTIDINE 20 MG PO TABS: 20.0000 mg | ORAL_TABLET | Freq: Two times a day (BID) | ORAL | Status: DC

## 2015-03-22 MED ORDER — BENZONATATE 200 MG PO CAPS: 200.0000 mg | ORAL_CAPSULE | Freq: Three times a day (TID) | ORAL | Status: DC

## 2015-03-22 MED ORDER — HYDROCHLOROTHIAZIDE 12.5 MG PO TABS: 12.5000 mg | ORAL_TABLET | Freq: Every day | ORAL | Status: DC

## 2015-03-22 NOTE — Progress Notes (Signed)
03/22/2015 11:30 AM Sabrina Aguirre to be D/C'd Home per MD order.  Discussed prescriptions and follow up appointments with the patient. Prescriptions given to patient, medication list explained in detail. Pt verbalized understanding.    Medication List    STOP taking these medications        valsartan-hydrochlorothiazide 320-12.5 MG per tablet  Commonly known as:  DIOVAN-HCT      TAKE these medications        aspirin 81 MG tablet  Take 81 mg by mouth daily.     benzonatate 200 MG capsule  Commonly known as:  TESSALON  Take 1 capsule (200 mg total) by mouth 3 (three) times daily.     cefUROXime 500 MG tablet  Commonly known as:  CEFTIN  Take 1 tablet (500 mg total) by mouth 2 (two) times daily with a meal.     Cinnamon 500 MG Tabs  Take 1 tablet by mouth daily.     dextromethorphan 30 MG/5ML liquid  Commonly known as:  DELSYM  Take 5 mLs (30 mg total) by mouth 2 (two) times daily as needed for cough.     famotidine 20 MG tablet  Commonly known as:  PEPCID  Take 1 tablet (20 mg total) by mouth 2 (two) times daily.     ferrous sulfate 325 (65 FE) MG tablet  Take 1 tablet (325 mg total) by mouth 2 (two) times daily with a meal.     fish oil-omega-3 fatty acids 1000 MG capsule  Take 2 g by mouth daily.     hydrochlorothiazide 12.5 MG tablet  Commonly known as:  HYDRODIURIL  Take 1 tablet (12.5 mg total) by mouth daily.     loratadine 10 MG tablet  Commonly known as:  CLARITIN  Take 1 tablet (10 mg total) by mouth daily.     metFORMIN 500 MG tablet  Commonly known as:  GLUCOPHAGE  TAKE ONE TABLET BY MOUTH TWICE DAILY WITH MEALS     SUMAtriptan 100 MG tablet  Commonly known as:  IMITREX  Take 1 tablet (100 mg total) by mouth every 2 (two) hours as needed.     vitamin B-12 1000 MCG tablet  Commonly known as:  CYANOCOBALAMIN  Take 1,000 mcg by mouth daily.     vitamin C 1000 MG tablet  Take 1,000 mg by mouth daily.        Filed Vitals:   03/22/15 0737  BP:  135/84  Pulse: 95  Temp: 98.5 F (36.9 C)  Resp: 18    Skin clean, dry and intact without evidence of skin break down, no evidence of skin tears noted. IV catheter discontinued intact. Site without signs and symptoms of complications. Dressing and pressure applied. Pt denies pain at this time. No complaints noted.  An After Visit Summary was printed and given to the patient. Patient escorted via [place], and D/C home via private auto.  Retta Mac BSN, RN

## 2015-03-22 NOTE — Care Management Important Message (Signed)
Important Message  Patient Details  Name: Sabrina Aguirre MRN: [id number] Date of Birth: 10/09/1940   Medicare Important Message Given:  Yes-second notification given    Delorse Lek 03/22/2015, 12:10 PM

## 2015-03-23 ENCOUNTER — Telehealth: Payer: Self-pay | Admitting: Family Medicine

## 2015-03-23 NOTE — Telephone Encounter (Signed)
Pt was released from hospital yesterday. Left a message for pt to call back to check in on her.

## 2015-03-29 ENCOUNTER — Inpatient Hospital Stay: Payer: Medicare PPO | Admitting: Family Medicine

## 2015-03-30 ENCOUNTER — Other Ambulatory Visit: Payer: Self-pay | Admitting: Family Medicine

## 2015-04-05 ENCOUNTER — Inpatient Hospital Stay: Payer: Medicare PPO | Admitting: Family Medicine

## 2015-04-05 DIAGNOSIS — I10 Essential (primary) hypertension: Secondary | ICD-10-CM | POA: Diagnosis not present

## 2015-04-05 DIAGNOSIS — R05 Cough: Secondary | ICD-10-CM | POA: Diagnosis not present

## 2015-04-05 DIAGNOSIS — K219 Gastro-esophageal reflux disease without esophagitis: Secondary | ICD-10-CM | POA: Diagnosis not present

## 2015-04-05 DIAGNOSIS — D649 Anemia, unspecified: Secondary | ICD-10-CM | POA: Diagnosis not present

## 2015-04-05 DIAGNOSIS — A419 Sepsis, unspecified organism: Secondary | ICD-10-CM | POA: Diagnosis not present

## 2015-04-05 DIAGNOSIS — J4520 Mild intermittent asthma, uncomplicated: Secondary | ICD-10-CM | POA: Diagnosis not present

## 2015-04-05 DIAGNOSIS — N390 Urinary tract infection, site not specified: Secondary | ICD-10-CM | POA: Diagnosis not present

## 2015-04-05 DIAGNOSIS — E119 Type 2 diabetes mellitus without complications: Secondary | ICD-10-CM | POA: Diagnosis not present

## 2015-04-07 DIAGNOSIS — H2513 Age-related nuclear cataract, bilateral: Secondary | ICD-10-CM | POA: Diagnosis not present

## 2015-04-07 DIAGNOSIS — H43812 Vitreous degeneration, left eye: Secondary | ICD-10-CM | POA: Diagnosis not present

## 2015-04-07 DIAGNOSIS — H40013 Open angle with borderline findings, low risk, bilateral: Secondary | ICD-10-CM | POA: Diagnosis not present

## 2015-04-07 DIAGNOSIS — H02422 Myogenic ptosis of left eyelid: Secondary | ICD-10-CM | POA: Diagnosis not present

## 2015-04-07 DIAGNOSIS — H02402 Unspecified ptosis of left eyelid: Secondary | ICD-10-CM | POA: Diagnosis not present

## 2015-04-07 DIAGNOSIS — E119 Type 2 diabetes mellitus without complications: Secondary | ICD-10-CM | POA: Diagnosis not present

## 2015-04-19 ENCOUNTER — Ambulatory Visit: Payer: Medicare HMO

## 2015-04-20 ENCOUNTER — Ambulatory Visit
Admission: RE | Admit: 2015-04-20 | Discharge: 2015-04-20 | Disposition: A | Discharge status: Home / Self Care | Payer: Medicare PPO | Source: Ambulatory Visit

## 2015-04-20 DIAGNOSIS — Z1231 Encounter for screening mammogram for malignant neoplasm of breast: Secondary | ICD-10-CM

## 2015-04-23 DIAGNOSIS — I10 Essential (primary) hypertension: Secondary | ICD-10-CM | POA: Diagnosis not present

## 2015-04-23 DIAGNOSIS — B9620 Unspecified Escherichia coli [E. coli] as the cause of diseases classified elsewhere: Secondary | ICD-10-CM | POA: Diagnosis not present

## 2015-04-23 DIAGNOSIS — E119 Type 2 diabetes mellitus without complications: Secondary | ICD-10-CM | POA: Diagnosis not present

## 2015-04-23 DIAGNOSIS — N390 Urinary tract infection, site not specified: Secondary | ICD-10-CM | POA: Diagnosis not present

## 2015-05-06 ENCOUNTER — Telehealth: Payer: Self-pay | Admitting: Family Medicine

## 2015-05-06 NOTE — Telephone Encounter (Signed)
Called pt to schedule diabetic followup and she advised she was no longer seeing Dr. Redmond School.

## 2015-07-08 DIAGNOSIS — R229 Localized swelling, mass and lump, unspecified: Secondary | ICD-10-CM | POA: Diagnosis not present

## 2015-07-08 DIAGNOSIS — M25531 Pain in right wrist: Secondary | ICD-10-CM | POA: Diagnosis not present

## 2015-07-08 DIAGNOSIS — R102 Pelvic and perineal pain: Secondary | ICD-10-CM | POA: Diagnosis not present

## 2015-07-08 DIAGNOSIS — M545 Low back pain: Secondary | ICD-10-CM | POA: Diagnosis not present

## 2015-08-10 DIAGNOSIS — G43009 Migraine without aura, not intractable, without status migrainosus: Secondary | ICD-10-CM | POA: Diagnosis not present

## 2015-08-10 DIAGNOSIS — E119 Type 2 diabetes mellitus without complications: Secondary | ICD-10-CM | POA: Diagnosis not present

## 2015-08-10 DIAGNOSIS — I10 Essential (primary) hypertension: Secondary | ICD-10-CM | POA: Diagnosis not present

## 2015-10-08 ENCOUNTER — Other Ambulatory Visit: Payer: Self-pay | Admitting: Family Medicine

## 2015-10-08 DIAGNOSIS — M8580 Other specified disorders of bone density and structure, unspecified site: Secondary | ICD-10-CM

## 2015-10-27 ENCOUNTER — Ambulatory Visit
Admission: RE | Admit: 2015-10-27 | Discharge: 2015-10-27 | Disposition: A | Discharge status: Home / Self Care | Payer: Medicare Other | Source: Ambulatory Visit | Attending: Family Medicine | Admitting: Family Medicine

## 2015-10-27 DIAGNOSIS — M8580 Other specified disorders of bone density and structure, unspecified site: Secondary | ICD-10-CM

## 2015-11-15 ENCOUNTER — Other Ambulatory Visit: Payer: Self-pay | Admitting: Family Medicine

## 2015-11-15 NOTE — Telephone Encounter (Signed)
She needs a follow-up appointment 

## 2015-11-28 ENCOUNTER — Emergency Department (HOSPITAL_COMMUNITY): Payer: No Typology Code available for payment source

## 2015-11-28 ENCOUNTER — Encounter (HOSPITAL_COMMUNITY): Payer: Self-pay | Admitting: Emergency Medicine

## 2015-11-28 ENCOUNTER — Emergency Department (HOSPITAL_COMMUNITY)
Admission: EM | Admit: 2015-11-28 | Discharge: 2015-11-28 | Disposition: A | Discharge status: Home / Self Care | Payer: No Typology Code available for payment source | Attending: Emergency Medicine | Admitting: Emergency Medicine

## 2015-11-28 DIAGNOSIS — K219 Gastro-esophageal reflux disease without esophagitis: Secondary | ICD-10-CM | POA: Insufficient documentation

## 2015-11-28 DIAGNOSIS — S39012A Strain of muscle, fascia and tendon of lower back, initial encounter: Secondary | ICD-10-CM | POA: Insufficient documentation

## 2015-11-28 DIAGNOSIS — E119 Type 2 diabetes mellitus without complications: Secondary | ICD-10-CM | POA: Diagnosis not present

## 2015-11-28 DIAGNOSIS — Y92410 Unspecified street and highway as the place of occurrence of the external cause: Secondary | ICD-10-CM | POA: Diagnosis not present

## 2015-11-28 DIAGNOSIS — Z79899 Other long term (current) drug therapy: Secondary | ICD-10-CM | POA: Insufficient documentation

## 2015-11-28 DIAGNOSIS — S3992XA Unspecified injury of lower back, initial encounter: Secondary | ICD-10-CM | POA: Diagnosis present

## 2015-11-28 DIAGNOSIS — F329 Major depressive disorder, single episode, unspecified: Secondary | ICD-10-CM | POA: Diagnosis not present

## 2015-11-28 DIAGNOSIS — Y998 Other external cause status: Secondary | ICD-10-CM | POA: Insufficient documentation

## 2015-11-28 DIAGNOSIS — Y9389 Activity, other specified: Secondary | ICD-10-CM | POA: Diagnosis not present

## 2015-11-28 DIAGNOSIS — I10 Essential (primary) hypertension: Secondary | ICD-10-CM | POA: Insufficient documentation

## 2015-11-28 MED ORDER — HYDROCODONE-ACETAMINOPHEN 5-325 MG PO TABS: ORAL_TABLET | ORAL | Status: DC

## 2015-11-28 MED ORDER — HYDROCODONE-ACETAMINOPHEN 5-325 MG PO TABS
1.0000 | ORAL_TABLET | Freq: Once | ORAL | Status: AC
  Administered 2015-11-28: 1 via ORAL
  Filled 2015-11-28: qty 1
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
## 2015-11-28 NOTE — ED Notes (Signed)
Patient transported to X-ray 

## 2015-11-28 NOTE — Discharge Instructions (Signed)
Take vicodin for breakthrough pain, do not drink alcohol, drive, care for children or do other critical tasks while taking vicodin.  Please be very careful not to fall! The pain medication and back pain puts you at risk for falls. Please rest as much as possible and try to not stay alone.   Please follow with your primary care doctor in the next 2 days for a check-up. They must obtain records for further management.   Do not hesitate to return to the Emergency Department for any new, worsening or concerning symptoms.    Lumbosacral Strain Lumbosacral strain is a strain of any of the parts that make up your lumbosacral vertebrae. Your lumbosacral vertebrae are the bones that make up the lower third of your backbone. Your lumbosacral vertebrae are held together by muscles and tough, fibrous tissue (ligaments).  CAUSES  A sudden blow to your back can cause lumbosacral strain. Also, anything that causes an excessive stretch of the muscles in the low back can cause this strain. This is typically seen when people exert themselves strenuously, fall, lift heavy objects, bend, or crouch repeatedly. RISK FACTORS  Physically demanding work.  Participation in pushing or pulling sports or sports that require a sudden twist of the back (tennis, golf, baseball).  Weight lifting.  Excessive lower back curvature.  Forward-tilted pelvis.  Weak back or abdominal muscles or both.  Tight hamstrings. SIGNS AND SYMPTOMS  Lumbosacral strain may cause pain in the area of your injury or pain that moves (radiates) down your leg.  DIAGNOSIS Your health care provider can often diagnose lumbosacral strain through a physical exam. In some cases, you may need tests such as X-ray exams.  TREATMENT  Treatment for your lower back injury depends on many factors that your clinician will have to evaluate. However, most treatment will include the use of anti-inflammatory medicines. HOME CARE INSTRUCTIONS   Avoid hard  physical activities (tennis, racquetball, waterskiing) if you are not in proper physical condition for it. This may aggravate or create problems.  If you have a back problem, avoid sports requiring sudden body movements. Swimming and walking are generally safer activities.  Maintain good posture.  Maintain a healthy weight.  For acute conditions, you may put ice on the injured area.  Put ice in a plastic bag.  Place a towel between your skin and the bag.  Leave the ice on for 20 minutes, 2-3 times a day.  When the low back starts healing, stretching and strengthening exercises may be recommended. SEEK MEDICAL CARE IF:  Your back pain is getting worse.  You experience severe back pain not relieved with medicines. SEEK IMMEDIATE MEDICAL CARE IF:   You have numbness, tingling, weakness, or problems with the use of your arms or legs.  There is a change in bowel or bladder control.  You have increasing pain in any area of the body, including your belly (abdomen).  You notice shortness of breath, dizziness, or feel faint.  You feel sick to your stomach (nauseous), are throwing up (vomiting), or become sweaty.  You notice discoloration of your toes or legs, or your feet get very cold. MAKE SURE YOU:   Understand these instructions.  Will watch your condition.  Will get help right away if you are not doing well or get worse.   This information is not intended to replace advice given to you by your health care provider. Make sure you discuss any questions you have with your health care provider.  Document Released: 05/17/2005 Document Revised: 08/28/2014 Document Reviewed: 03/26/2013 Elsevier Interactive Patient Education [employer].

## 2015-11-28 NOTE — ED Notes (Signed)
PA at bedside.
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
## 2016-03-14 ENCOUNTER — Other Ambulatory Visit: Payer: Self-pay | Admitting: Family Medicine

## 2016-03-14 DIAGNOSIS — Z1231 Encounter for screening mammogram for malignant neoplasm of breast: Secondary | ICD-10-CM

## 2016-04-20 ENCOUNTER — Ambulatory Visit
Admission: RE | Admit: 2016-04-20 | Discharge: 2016-04-20 | Disposition: A | Discharge status: Home / Self Care | Payer: Medicare Other | Source: Ambulatory Visit | Attending: Family Medicine | Admitting: Family Medicine

## 2016-04-20 DIAGNOSIS — Z1231 Encounter for screening mammogram for malignant neoplasm of breast: Secondary | ICD-10-CM

## 2016-09-12 IMAGING — DX DG CHEST 2V
2 series · 2 of 2 positions shown · non-contrast
Comparison: 11/09/2008

CLINICAL DATA: Midsternal chest pain, productive cough, headache
and weakness.

EXAM:
CHEST  2 VIEW

[chest pa]
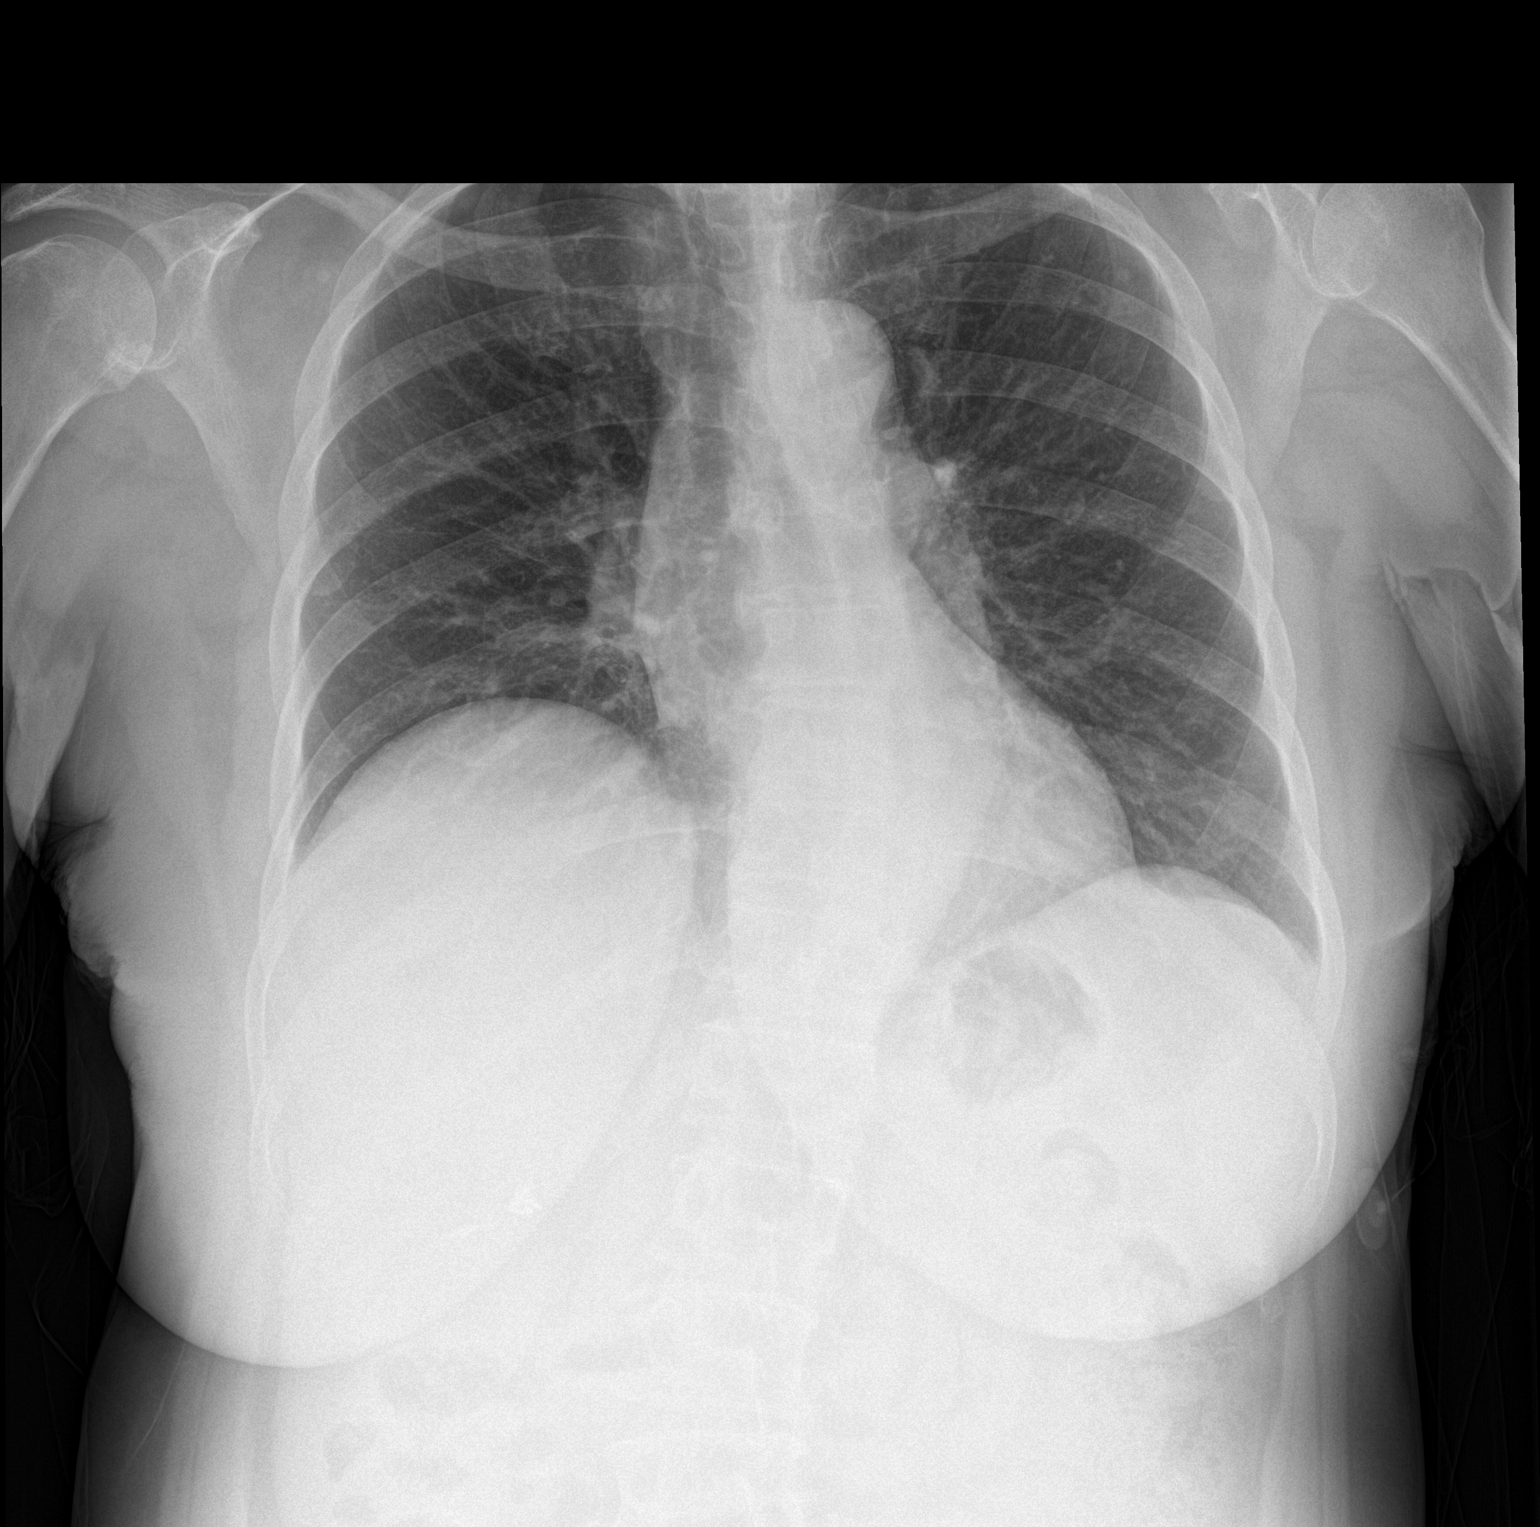

[chest lat]
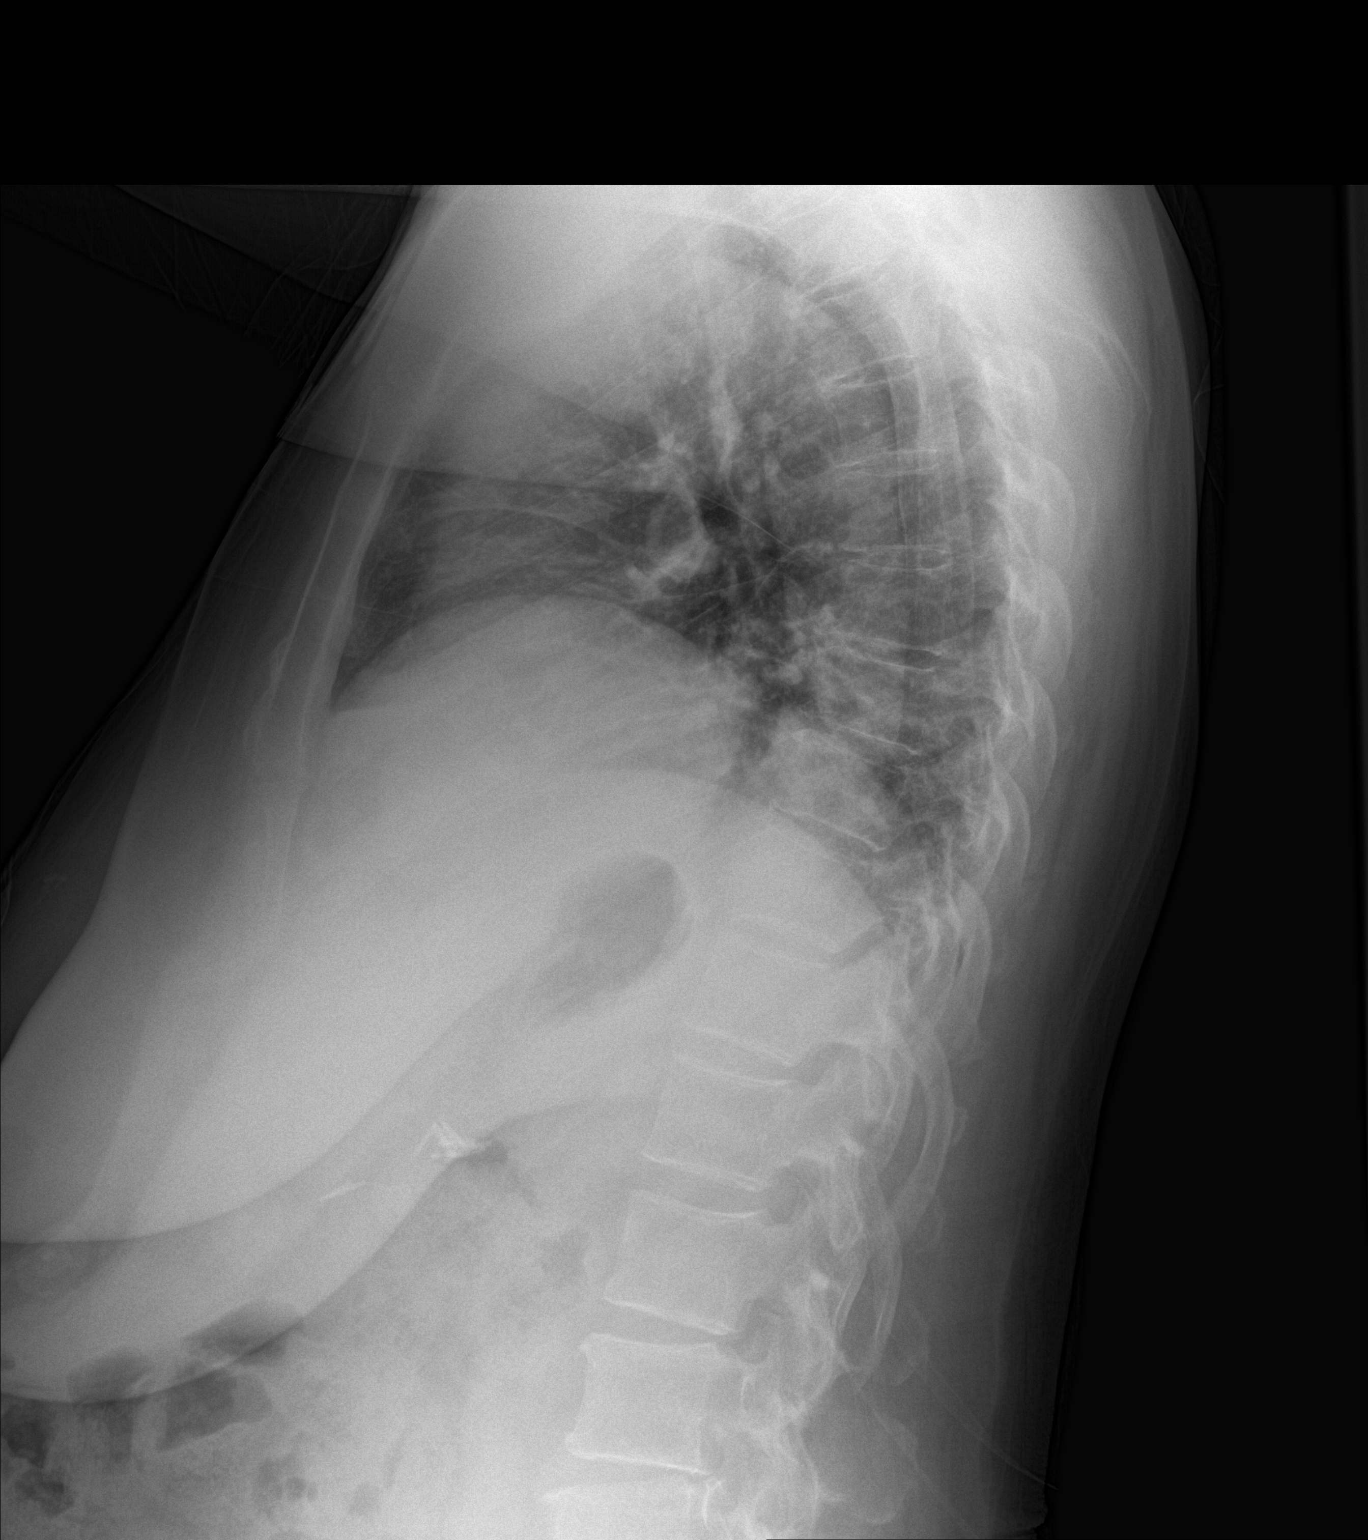

[2 of 2 positions shown; findings below may reference images not displayed]

FINDINGS: There is unchanged right hemidiaphragm elevation. Heart size is
normal. There is mild unchanged aortic tortuosity. The lungs are
clear. There are no effusions. The pulmonary vasculature is normal.
IMPRESSION: No active cardiopulmonary disease.

## 2016-10-12 DIAGNOSIS — M19041 Primary osteoarthritis, right hand: Secondary | ICD-10-CM | POA: Insufficient documentation

## 2017-03-12 ENCOUNTER — Other Ambulatory Visit: Payer: Self-pay | Admitting: Family Medicine

## 2017-03-12 DIAGNOSIS — Z1231 Encounter for screening mammogram for malignant neoplasm of breast: Secondary | ICD-10-CM

## 2017-04-24 ENCOUNTER — Ambulatory Visit
Admission: RE | Admit: 2017-04-24 | Discharge: 2017-04-24 | Disposition: A | Discharge status: Home / Self Care | Payer: Medicare Other | Source: Ambulatory Visit | Attending: Family Medicine | Admitting: Family Medicine

## 2017-04-24 DIAGNOSIS — Z1231 Encounter for screening mammogram for malignant neoplasm of breast: Secondary | ICD-10-CM

## 2017-07-21 ENCOUNTER — Encounter (HOSPITAL_BASED_OUTPATIENT_CLINIC_OR_DEPARTMENT_OTHER): Payer: Self-pay | Admitting: *Deleted

## 2017-07-21 ENCOUNTER — Emergency Department (HOSPITAL_BASED_OUTPATIENT_CLINIC_OR_DEPARTMENT_OTHER): Payer: Medicare Other

## 2017-07-21 ENCOUNTER — Other Ambulatory Visit: Payer: Self-pay

## 2017-07-21 ENCOUNTER — Observation Stay (HOSPITAL_BASED_OUTPATIENT_CLINIC_OR_DEPARTMENT_OTHER)
Admission: EM | Admit: 2017-07-21 | Discharge: 2017-07-25 | Disposition: A | Discharge status: Home / Self Care | Payer: Medicare Other | Attending: Internal Medicine | Admitting: Internal Medicine

## 2017-07-21 DIAGNOSIS — R0789 Other chest pain: Secondary | ICD-10-CM | POA: Diagnosis present

## 2017-07-21 DIAGNOSIS — R079 Chest pain, unspecified: Secondary | ICD-10-CM | POA: Diagnosis present

## 2017-07-21 DIAGNOSIS — R0781 Pleurodynia: Secondary | ICD-10-CM | POA: Insufficient documentation

## 2017-07-21 DIAGNOSIS — Z7984 Long term (current) use of oral hypoglycemic drugs: Secondary | ICD-10-CM | POA: Insufficient documentation

## 2017-07-21 DIAGNOSIS — Z79899 Other long term (current) drug therapy: Secondary | ICD-10-CM | POA: Diagnosis not present

## 2017-07-21 DIAGNOSIS — I700 Atherosclerosis of aorta: Secondary | ICD-10-CM | POA: Insufficient documentation

## 2017-07-21 DIAGNOSIS — R945 Abnormal results of liver function studies: Secondary | ICD-10-CM | POA: Insufficient documentation

## 2017-07-21 DIAGNOSIS — E119 Type 2 diabetes mellitus without complications: Secondary | ICD-10-CM | POA: Diagnosis not present

## 2017-07-21 DIAGNOSIS — Z90710 Acquired absence of both cervix and uterus: Secondary | ICD-10-CM | POA: Diagnosis not present

## 2017-07-21 DIAGNOSIS — D3500 Benign neoplasm of unspecified adrenal gland: Secondary | ICD-10-CM

## 2017-07-21 DIAGNOSIS — E1159 Type 2 diabetes mellitus with other circulatory complications: Secondary | ICD-10-CM | POA: Diagnosis present

## 2017-07-21 DIAGNOSIS — G43909 Migraine, unspecified, not intractable, without status migrainosus: Secondary | ICD-10-CM | POA: Insufficient documentation

## 2017-07-21 DIAGNOSIS — R269 Unspecified abnormalities of gait and mobility: Secondary | ICD-10-CM | POA: Insufficient documentation

## 2017-07-21 DIAGNOSIS — E785 Hyperlipidemia, unspecified: Secondary | ICD-10-CM | POA: Diagnosis not present

## 2017-07-21 DIAGNOSIS — D3502 Benign neoplasm of left adrenal gland: Secondary | ICD-10-CM | POA: Insufficient documentation

## 2017-07-21 DIAGNOSIS — M8580 Other specified disorders of bone density and structure, unspecified site: Secondary | ICD-10-CM | POA: Insufficient documentation

## 2017-07-21 DIAGNOSIS — N644 Mastodynia: Secondary | ICD-10-CM | POA: Diagnosis not present

## 2017-07-21 DIAGNOSIS — Z7982 Long term (current) use of aspirin: Secondary | ICD-10-CM | POA: Insufficient documentation

## 2017-07-21 DIAGNOSIS — R0600 Dyspnea, unspecified: Secondary | ICD-10-CM | POA: Insufficient documentation

## 2017-07-21 DIAGNOSIS — E118 Type 2 diabetes mellitus with unspecified complications: Secondary | ICD-10-CM

## 2017-07-21 DIAGNOSIS — I2510 Atherosclerotic heart disease of native coronary artery without angina pectoris: Secondary | ICD-10-CM | POA: Insufficient documentation

## 2017-07-21 DIAGNOSIS — M40209 Unspecified kyphosis, site unspecified: Secondary | ICD-10-CM | POA: Insufficient documentation

## 2017-07-21 DIAGNOSIS — I152 Hypertension secondary to endocrine disorders: Secondary | ICD-10-CM | POA: Insufficient documentation

## 2017-07-21 DIAGNOSIS — E876 Hypokalemia: Secondary | ICD-10-CM | POA: Diagnosis not present

## 2017-07-21 DIAGNOSIS — R7989 Other specified abnormal findings of blood chemistry: Secondary | ICD-10-CM

## 2017-07-21 DIAGNOSIS — R109 Unspecified abdominal pain: Secondary | ICD-10-CM

## 2017-07-21 DIAGNOSIS — F329 Major depressive disorder, single episode, unspecified: Secondary | ICD-10-CM | POA: Diagnosis not present

## 2017-07-21 DIAGNOSIS — K219 Gastro-esophageal reflux disease without esophagitis: Secondary | ICD-10-CM | POA: Insufficient documentation

## 2017-07-21 DIAGNOSIS — I10 Essential (primary) hypertension: Secondary | ICD-10-CM | POA: Diagnosis present

## 2017-07-21 LAB — BASIC METABOLIC PANEL
Anion gap: 8 (ref 5–15)
BUN: 16 mg/dL (ref 6–20)
CALCIUM: 9.9 mg/dL (ref 8.9–10.3)
CHLORIDE: 99 mmol/L — AB (ref 101–111)
CO2: 29 mmol/L (ref 22–32)
CREATININE: 0.98 mg/dL (ref 0.44–1.00)
GFR calc Af Amer: >60 mL/min (ref >60)
GFR calc non Af Amer: 54 mL/min — ABNORMAL LOW (ref >60)
Glucose, Bld: 135 mg/dL — ABNORMAL HIGH (ref 65–99)
Potassium: 3.1 mmol/L — ABNORMAL LOW (ref 3.5–5.1)
Sodium: 136 mmol/L (ref 135–145)

## 2017-07-21 LAB — CBC
HCT: 40.1 % (ref 36.0–46.0)
Hemoglobin: 13.3 g/dL (ref 12.0–15.0)
MCH: 28.6 pg (ref 26.0–34.0)
MCHC: 33.2 g/dL (ref 30.0–36.0)
MCV: 86.2 fL (ref 78.0–100.0)
PLATELETS: 206 10*3/uL (ref 150–400)
RBC: 4.65 MIL/uL (ref 3.87–5.11)
RDW: 13.6 % (ref 11.5–15.5)
WBC: 7.2 10*3/uL (ref 4.0–10.5)

## 2017-07-21 LAB — TROPONIN I: Troponin I: <0.03 ng/mL (ref <0.03)

## 2017-07-21 LAB — HEPATIC FUNCTION PANEL
ALT: 46 U/L (ref 14–54)
AST: 44 U/L — ABNORMAL HIGH (ref 15–41)
Albumin: 4.5 g/dL (ref 3.5–5.0)
Alkaline Phosphatase: 72 U/L (ref 38–126)
BILIRUBIN TOTAL: 0.6 mg/dL (ref 0.3–1.2)
Bilirubin, Direct: <0.1 mg/dL — ABNORMAL LOW (ref 0.1–0.5)
TOTAL PROTEIN: 7.8 g/dL (ref 6.5–8.1)

## 2017-07-21 LAB — D-DIMER, QUANTITATIVE: D-Dimer, Quant: 0.31 ug/mL-FEU (ref 0.00–0.50)

## 2017-07-21 LAB — LIPASE, BLOOD: Lipase: 44 U/L (ref 11–51)

## 2017-07-21 MED ORDER — FENTANYL CITRATE (PF) 100 MCG/2ML IJ SOLN
50.0000 ug | Freq: Once | INTRAMUSCULAR | Status: AC
  Administered 2017-07-21: 50 ug via INTRAVENOUS
  Filled 2017-07-21: qty 2

## 2017-07-21 MED ORDER — ASPIRIN 81 MG PO CHEW
324.0000 mg | CHEWABLE_TABLET | Freq: Once | ORAL | Status: AC
  Administered 2017-07-21: 324 mg via ORAL
  Filled 2017-07-21: qty 4

## 2017-07-21 MED ORDER — IOPAMIDOL (ISOVUE-370) INJECTION 76%
100.0000 mL | Freq: Once | INTRAVENOUS | Status: AC | PRN
  Administered 2017-07-21: 100 mL via INTRAVENOUS

## 2017-07-21 MED ORDER — NITROGLYCERIN 0.4 MG SL SUBL
0.4000 mg | SUBLINGUAL_TABLET | SUBLINGUAL | Status: DC | PRN
  Administered 2017-07-21: 0.4 mg via SUBLINGUAL
  Filled 2017-07-21: qty 1

## 2017-07-21 NOTE — ED Notes (Signed)
To CT scan
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
## 2017-07-21 NOTE — ED Triage Notes (Signed)
Patient states she developed left chest pain this morning around 0630.  Describes the pain as intermittent sharp pain and is associated with sob.

## 2017-07-22 ENCOUNTER — Observation Stay (HOSPITAL_BASED_OUTPATIENT_CLINIC_OR_DEPARTMENT_OTHER): Payer: Medicare Other

## 2017-07-22 ENCOUNTER — Encounter (HOSPITAL_BASED_OUTPATIENT_CLINIC_OR_DEPARTMENT_OTHER): Payer: Self-pay | Admitting: Adult Health

## 2017-07-22 DIAGNOSIS — R079 Chest pain, unspecified: Secondary | ICD-10-CM | POA: Diagnosis present

## 2017-07-22 DIAGNOSIS — E876 Hypokalemia: Secondary | ICD-10-CM | POA: Diagnosis not present

## 2017-07-22 DIAGNOSIS — D3500 Benign neoplasm of unspecified adrenal gland: Secondary | ICD-10-CM | POA: Diagnosis not present

## 2017-07-22 DIAGNOSIS — I10 Essential (primary) hypertension: Secondary | ICD-10-CM

## 2017-07-22 DIAGNOSIS — R945 Abnormal results of liver function studies: Secondary | ICD-10-CM | POA: Diagnosis not present

## 2017-07-22 DIAGNOSIS — R0789 Other chest pain: Secondary | ICD-10-CM | POA: Diagnosis not present

## 2017-07-22 DIAGNOSIS — E118 Type 2 diabetes mellitus with unspecified complications: Secondary | ICD-10-CM | POA: Diagnosis not present

## 2017-07-22 LAB — ECHOCARDIOGRAM COMPLETE
Ao-asc: 35 cm
E decel time: 180 msec
FS: 35 % (ref 28–44)
HEIGHTINCHES: 64 in
IV/PV OW: 1.09
LA vol A4C: 43.5 ml
LA vol: 44.9 mL
LADIAMINDEX: 1.89 cm/m2
LASIZE: 37 mm
LAVOLIN: 23 mL/m2
LDCA: 2.84 cm2
LEFT ATRIUM END SYS DIAM: 37 mm
LV PW d: 10.2 mm — AB (ref 0.6–1.1)
LVOT diameter: 19 mm
MV Dec: 180
MV Peak grad: 3 mmHg
MV pk E vel: 90.8 m/s
MVPKAVEL: 116 m/s
TAPSE: 19.5 mm
WEIGHTICAEL: 2892.8 [oz_av]

## 2017-07-22 LAB — GLUCOSE, CAPILLARY
GLUCOSE-CAPILLARY: 114 mg/dL — AB (ref 65–99)
GLUCOSE-CAPILLARY: 110 mg/dL — AB (ref 65–99)
GLUCOSE-CAPILLARY: 148 mg/dL — AB (ref 65–99)
GLUCOSE-CAPILLARY: 117 mg/dL — AB (ref 65–99)
Glucose-Capillary: 126 mg/dL — ABNORMAL HIGH (ref 65–99)
Glucose-Capillary: 98 mg/dL (ref 65–99)

## 2017-07-22 LAB — LIPID PANEL
Cholesterol: 98 mg/dL (ref 0–200)
HDL: 44 mg/dL (ref >40)
LDL Cholesterol: 28 mg/dL (ref 0–99)
Total CHOL/HDL Ratio: 2.2 RATIO
Triglycerides: 128 mg/dL (ref <150)
VLDL: 26 mg/dL (ref 0–40)

## 2017-07-22 LAB — BASIC METABOLIC PANEL
ANION GAP: 5 (ref 5–15)
Anion gap: 7 (ref 5–15)
BUN: 6 mg/dL (ref 6–20)
BUN: 9 mg/dL (ref 6–20)
CHLORIDE: 125 mmol/L — AB (ref 101–111)
CHLORIDE: 106 mmol/L (ref 101–111)
CO2: 11 mmol/L — ABNORMAL LOW (ref 22–32)
CO2: 23 mmol/L (ref 22–32)
Calcium: 5.2 mg/dL — CL (ref 8.9–10.3)
Calcium: 9.5 mg/dL (ref 8.9–10.3)
Creatinine, Ser: 0.41 mg/dL — ABNORMAL LOW (ref 0.44–1.00)
Creatinine, Ser: 0.91 mg/dL (ref 0.44–1.00)
GFR calc Af Amer: >60 mL/min (ref >60)
GFR calc non Af Amer: >60 mL/min (ref >60)
GFR calc non Af Amer: 59 mL/min — ABNORMAL LOW (ref >60)
Glucose, Bld: 51 mg/dL — ABNORMAL LOW (ref 65–99)
Glucose, Bld: 118 mg/dL — ABNORMAL HIGH (ref 65–99)
POTASSIUM: 2.1 mmol/L — AB (ref 3.5–5.1)
POTASSIUM: 3.5 mmol/L (ref 3.5–5.1)
SODIUM: 141 mmol/L (ref 135–145)
SODIUM: 136 mmol/L (ref 135–145)

## 2017-07-22 LAB — TROPONIN I
Troponin I: <0.03 ng/mL (ref <0.03)
Troponin I: <0.03 ng/mL (ref <0.03)
Troponin I: <0.03 ng/mL (ref <0.03)

## 2017-07-22 LAB — MAGNESIUM
MAGNESIUM: 0.8 mg/dL — AB (ref 1.7–2.4)
MAGNESIUM: 2.8 mg/dL — AB (ref 1.7–2.4)

## 2017-07-22 LAB — MRSA PCR SCREENING: MRSA BY PCR: NEGATIVE

## 2017-07-22 MED ORDER — INSULIN ASPART 100 UNIT/ML ~~LOC~~ SOLN
0.0000 [IU] | SUBCUTANEOUS | Status: DC
  Administered 2017-07-22: 1 [IU] via SUBCUTANEOUS

## 2017-07-22 MED ORDER — ACETAMINOPHEN 325 MG PO TABS
650.0000 mg | ORAL_TABLET | Freq: Four times a day (QID) | ORAL | Status: DC | PRN
  Administered 2017-07-24: 650 mg via ORAL
  Filled 2017-07-22: qty 2

## 2017-07-22 MED ORDER — PANTOPRAZOLE SODIUM 40 MG PO TBEC
40.0000 mg | DELAYED_RELEASE_TABLET | Freq: Two times a day (BID) | ORAL | Status: DC
  Administered 2017-07-22 – 2017-07-25 (×7): 40 mg via ORAL
  Filled 2017-07-22 (×7): qty 1

## 2017-07-22 MED ORDER — LORATADINE 10 MG PO TABS
10.0000 mg | ORAL_TABLET | Freq: Every day | ORAL | Status: DC
  Administered 2017-07-22 – 2017-07-25 (×4): 10 mg via ORAL
  Filled 2017-07-22 (×4): qty 1

## 2017-07-22 MED ORDER — ACETAMINOPHEN 650 MG RE SUPP: 650.0000 mg | Freq: Four times a day (QID) | RECTAL | Status: DC | PRN

## 2017-07-22 MED ORDER — HYDROCHLOROTHIAZIDE 12.5 MG PO CAPS
12.5000 mg | ORAL_CAPSULE | Freq: Every day | ORAL | Status: DC
  Administered 2017-07-22 – 2017-07-25 (×4): 12.5 mg via ORAL
  Filled 2017-07-22 (×4): qty 1

## 2017-07-22 MED ORDER — SODIUM CHLORIDE 0.9 % IV SOLN
INTRAVENOUS | Status: AC
  Administered 2017-07-22: 04:00:00 via INTRAVENOUS

## 2017-07-22 MED ORDER — POTASSIUM CHLORIDE CRYS ER 20 MEQ PO TBCR
40.0000 meq | EXTENDED_RELEASE_TABLET | Freq: Once | ORAL | Status: AC
  Administered 2017-07-22: 40 meq via ORAL
  Filled 2017-07-22: qty 2

## 2017-07-22 MED ORDER — ASPIRIN 81 MG PO CHEW
81.0000 mg | CHEWABLE_TABLET | Freq: Every day | ORAL | Status: DC
  Administered 2017-07-22 – 2017-07-25 (×4): 81 mg via ORAL
  Filled 2017-07-22 (×4): qty 1

## 2017-07-22 MED ORDER — SODIUM CHLORIDE 0.9 % IV SOLN
6.0000 g | Freq: Once | INTRAVENOUS | Status: DC
  Filled 2017-07-22: qty 60

## 2017-07-22 MED ORDER — IRBESARTAN 300 MG PO TABS
300.0000 mg | ORAL_TABLET | Freq: Every day | ORAL | Status: DC
Start: 2017-07-22 — End: 2017-07-25
  Administered 2017-07-22 – 2017-07-25 (×4): 300 mg via ORAL
  Filled 2017-07-22 (×2): qty 2
  Filled 2017-07-22 (×2): qty 1

## 2017-07-22 MED ORDER — FAMOTIDINE 20 MG PO TABS
20.0000 mg | ORAL_TABLET | Freq: Two times a day (BID) | ORAL | Status: DC
  Administered 2017-07-22 – 2017-07-23 (×3): 20 mg via ORAL
  Filled 2017-07-22 (×3): qty 1

## 2017-07-22 MED ORDER — VITAMIN B-12 1000 MCG PO TABS
1000.0000 ug | ORAL_TABLET | Freq: Every day | ORAL | Status: DC
  Administered 2017-07-22 – 2017-07-25 (×4): 1000 ug via ORAL
  Filled 2017-07-22 (×4): qty 1

## 2017-07-22 MED ORDER — TRAMADOL HCL 50 MG PO TABS
50.0000 mg | ORAL_TABLET | Freq: Four times a day (QID) | ORAL | Status: DC | PRN
  Administered 2017-07-22 – 2017-07-24 (×5): 50 mg via ORAL
  Filled 2017-07-22 (×6): qty 1

## 2017-07-22 MED ORDER — METFORMIN HCL 500 MG PO TABS: 500.0000 mg | ORAL_TABLET | Freq: Two times a day (BID) | ORAL | Status: DC

## 2017-07-22 MED ORDER — ENOXAPARIN SODIUM 40 MG/0.4ML ~~LOC~~ SOLN
40.0000 mg | SUBCUTANEOUS | Status: DC
  Administered 2017-07-22 – 2017-07-24 (×3): 40 mg via SUBCUTANEOUS
  Filled 2017-07-22 (×3): qty 0.4

## 2017-07-22 MED ORDER — VALSARTAN-HYDROCHLOROTHIAZIDE 320-12.5 MG PO TABS: 1.0000 | ORAL_TABLET | Freq: Every day | ORAL | Status: DC

## 2017-07-22 MED ORDER — MAGNESIUM SULFATE 4 GM/100ML IV SOLN
4.0000 g | Freq: Once | INTRAVENOUS | Status: AC
Start: 2017-07-22 — End: 2017-07-22
  Administered 2017-07-22: 4 g via INTRAVENOUS
  Filled 2017-07-22: qty 100

## 2017-07-22 MED ORDER — POTASSIUM CHLORIDE 10 MEQ/100ML IV SOLN
10.0000 meq | INTRAVENOUS | Status: AC
  Administered 2017-07-22 (×3): 10 meq via INTRAVENOUS
  Filled 2017-07-22 (×2): qty 100

## 2017-07-22 MED ORDER — FERROUS SULFATE 325 (65 FE) MG PO TABS
325.0000 mg | ORAL_TABLET | Freq: Two times a day (BID) | ORAL | Status: DC
  Administered 2017-07-22 – 2017-07-24 (×5): 325 mg via ORAL
  Filled 2017-07-22 (×5): qty 1

## 2017-07-22 MED ORDER — SODIUM CHLORIDE 0.9 % IV SOLN
3.0000 g | Freq: Once | INTRAVENOUS | Status: AC
  Administered 2017-07-22: 3 g via INTRAVENOUS
  Filled 2017-07-22: qty 30

## 2017-07-22 MED ORDER — HYDROCODONE-ACETAMINOPHEN 5-325 MG PO TABS
1.0000 | ORAL_TABLET | Freq: Four times a day (QID) | ORAL | Status: DC | PRN
  Administered 2017-07-22 – 2017-07-25 (×10): 1 via ORAL
  Filled 2017-07-22 (×10): qty 1

## 2017-07-22 NOTE — Progress Notes (Addendum)
I have sent a message to our office's scheduling team requesting a follow-up appointment, and our office will call the patient with this information. Please contact cardiology team if any abnormalities noted on echo that need further review.  Bonne Whack PA-C

## 2017-07-22 NOTE — Progress Notes (Signed)
76 yo female with dm2 has chest pain , CTA chest negative, trop negative.  ED requesting chest pain admission
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
## 2017-07-22 NOTE — Progress Notes (Signed)
  CRITICAL VALUE ALERT  Critical Value:  Potassium 2.1; Calcium 5.2; Magnesium 0.8  Date & Time Notied:  07/22/2017 11:13am   Provider Notified: yes  Orders Received/Actions taken: K+ run x6; Mag run x1; Ca2+ run x1  [place]  Tejas Seawood, RN
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
## 2017-07-22 NOTE — Plan of Care (Signed)
Pt's pain level remains at a 8-10/10 even with pain meds; potassium runs still going; magnesium and calcium finished; pt's family updated; pt in good spirits.   [place]  Parth Mccormac, RN
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
## 2017-07-22 NOTE — Progress Notes (Signed)
  Echocardiogram 2D Echocardiogram has been performed.  Sabrina Aguirre 07/22/2017, 2:59 PM

## 2017-07-23 DIAGNOSIS — R740 Nonspecific elevation of levels of transaminase and lactic acid dehydrogenase [LDH]: Secondary | ICD-10-CM

## 2017-07-23 DIAGNOSIS — R0789 Other chest pain: Secondary | ICD-10-CM | POA: Diagnosis not present

## 2017-07-23 DIAGNOSIS — E876 Hypokalemia: Secondary | ICD-10-CM | POA: Diagnosis not present

## 2017-07-23 DIAGNOSIS — K5900 Constipation, unspecified: Secondary | ICD-10-CM

## 2017-07-23 DIAGNOSIS — I10 Essential (primary) hypertension: Secondary | ICD-10-CM | POA: Diagnosis not present

## 2017-07-23 DIAGNOSIS — R109 Unspecified abdominal pain: Secondary | ICD-10-CM | POA: Diagnosis not present

## 2017-07-23 LAB — GLUCOSE, CAPILLARY
GLUCOSE-CAPILLARY: 117 mg/dL — AB (ref 65–99)
GLUCOSE-CAPILLARY: 109 mg/dL — AB (ref 65–99)
GLUCOSE-CAPILLARY: 138 mg/dL — AB (ref 65–99)
Glucose-Capillary: 114 mg/dL — ABNORMAL HIGH (ref 65–99)
Glucose-Capillary: 101 mg/dL — ABNORMAL HIGH (ref 65–99)

## 2017-07-23 LAB — COMPREHENSIVE METABOLIC PANEL
ALT: 33 U/L (ref 14–54)
ANION GAP: 7 (ref 5–15)
AST: 33 U/L (ref 15–41)
Albumin: 3.4 g/dL — ABNORMAL LOW (ref 3.5–5.0)
Alkaline Phosphatase: 51 U/L (ref 38–126)
BUN: 12 mg/dL (ref 6–20)
CHLORIDE: 106 mmol/L (ref 101–111)
CO2: 24 mmol/L (ref 22–32)
Calcium: 9 mg/dL (ref 8.9–10.3)
Creatinine, Ser: 0.97 mg/dL (ref 0.44–1.00)
GFR calc non Af Amer: 55 mL/min — ABNORMAL LOW (ref >60)
Glucose, Bld: 114 mg/dL — ABNORMAL HIGH (ref 65–99)
POTASSIUM: 3.9 mmol/L (ref 3.5–5.1)
SODIUM: 137 mmol/L (ref 135–145)
Total Bilirubin: 0.7 mg/dL (ref 0.3–1.2)
Total Protein: 6.2 g/dL — ABNORMAL LOW (ref 6.5–8.1)

## 2017-07-23 LAB — CBC
HCT: 37.9 % (ref 36.0–46.0)
HEMOGLOBIN: 12.5 g/dL (ref 12.0–15.0)
MCH: 28.6 pg (ref 26.0–34.0)
MCHC: 33 g/dL (ref 30.0–36.0)
MCV: 86.7 fL (ref 78.0–100.0)
Platelets: 179 10*3/uL (ref 150–400)
RBC: 4.37 MIL/uL (ref 3.87–5.11)
RDW: 13.7 % (ref 11.5–15.5)
WBC: 5.7 10*3/uL (ref 4.0–10.5)

## 2017-07-23 LAB — HEPATITIS PANEL, ACUTE
Hep A IgM: NEGATIVE
Hep B C IgM: NEGATIVE
Hepatitis B Surface Ag: NEGATIVE

## 2017-07-23 LAB — HEMOGLOBIN A1C
Hgb A1c MFr Bld: 6.2 % — ABNORMAL HIGH (ref 4.8–5.6)
MEAN PLASMA GLUCOSE: 131 mg/dL

## 2017-07-23 LAB — MAGNESIUM: Magnesium: 1.9 mg/dL (ref 1.7–2.4)

## 2017-07-23 MED ORDER — INSULIN ASPART 100 UNIT/ML ~~LOC~~ SOLN: 0.0000 [IU] | Freq: Three times a day (TID) | SUBCUTANEOUS | Status: DC

## 2017-07-23 MED ORDER — INSULIN ASPART 100 UNIT/ML ~~LOC~~ SOLN: 0.0000 [IU] | Freq: Every day | SUBCUTANEOUS | Status: DC

## 2017-07-23 MED ORDER — VITAMIN D 1000 UNITS PO TABS
1000.0000 [IU] | ORAL_TABLET | Freq: Every day | ORAL | Status: DC
  Administered 2017-07-23 – 2017-07-25 (×3): 1000 [IU] via ORAL
  Filled 2017-07-23 (×3): qty 1

## 2017-07-23 MED ORDER — MAGNESIUM CITRATE PO SOLN
1.0000 | Freq: Once | ORAL | Status: DC | PRN
  Filled 2017-07-23: qty 296

## 2017-07-23 MED ORDER — ADULT MULTIVITAMIN W/MINERALS CH
1.0000 | ORAL_TABLET | Freq: Every day | ORAL | Status: DC
  Administered 2017-07-23 – 2017-07-25 (×3): 1 via ORAL
  Filled 2017-07-23 (×4): qty 1

## 2017-07-23 MED ORDER — DOCUSATE SODIUM 100 MG PO CAPS
200.0000 mg | ORAL_CAPSULE | Freq: Every day | ORAL | Status: DC
  Administered 2017-07-23 – 2017-07-25 (×3): 200 mg via ORAL
  Filled 2017-07-23 (×3): qty 2

## 2017-07-23 MED ORDER — CALCIUM CARB-CHOLECALCIFEROL 600-800 MG-UNIT PO TABS: ORAL_TABLET | Freq: Every day | ORAL | Status: DC

## 2017-07-23 MED ORDER — LACTULOSE 10 GM/15ML PO SOLN: 10.0000 g | Freq: Two times a day (BID) | ORAL | Status: DC | PRN

## 2017-07-23 MED ORDER — PSYLLIUM 95 % PO PACK
1.0000 | PACK | Freq: Three times a day (TID) | ORAL | Status: DC
  Administered 2017-07-23 (×3): 1 via ORAL
  Filled 2017-07-23 (×5): qty 1
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
## 2017-07-23 NOTE — Care Management Obs Status (Signed)
[place] NOTIFICATION   Patient Details  Name: Sabrina Aguirre MRN: [id number] Date of Birth: Feb 15, 1941   Medicare Observation Status Notification Given:  Yes    Maryclare Labrador, RN 07/23/2017, 11:20 AM

## 2017-07-23 NOTE — Plan of Care (Signed)
Continue current care plan 

## 2017-07-23 NOTE — Progress Notes (Signed)
Report called pt transferring to 5M05 via w/c with cellphone, charger and flowers. Pt will notify daugther of new room number.

## 2017-07-23 NOTE — Care Management Obs Status (Signed)
[place] NOTIFICATION   Patient Details  Name: [name] MRN: [id number] Date of Birth: 06-05-1941   Medicare Observation Status Notification Given:  Yes    Maryclare Labrador, RN 07/23/2017, 11:20 AM

## 2017-07-24 ENCOUNTER — Observation Stay (HOSPITAL_COMMUNITY): Payer: Medicare Other

## 2017-07-24 DIAGNOSIS — I10 Essential (primary) hypertension: Secondary | ICD-10-CM | POA: Diagnosis not present

## 2017-07-24 DIAGNOSIS — R109 Unspecified abdominal pain: Secondary | ICD-10-CM

## 2017-07-24 DIAGNOSIS — K5900 Constipation, unspecified: Secondary | ICD-10-CM | POA: Diagnosis not present

## 2017-07-24 DIAGNOSIS — R0789 Other chest pain: Secondary | ICD-10-CM | POA: Diagnosis not present

## 2017-07-24 LAB — COMPREHENSIVE METABOLIC PANEL
ALBUMIN: 3.6 g/dL (ref 3.5–5.0)
ALK PHOS: 50 U/L (ref 38–126)
ALT: 28 U/L (ref 14–54)
ANION GAP: 10 (ref 5–15)
AST: 29 U/L (ref 15–41)
BILIRUBIN TOTAL: 0.6 mg/dL (ref 0.3–1.2)
BUN: 12 mg/dL (ref 6–20)
CALCIUM: 9 mg/dL (ref 8.9–10.3)
CO2: 24 mmol/L (ref 22–32)
CREATININE: 1 mg/dL (ref 0.44–1.00)
Chloride: 101 mmol/L (ref 101–111)
GFR calc Af Amer: >60 mL/min (ref >60)
GFR calc non Af Amer: 53 mL/min — ABNORMAL LOW (ref >60)
Glucose, Bld: 125 mg/dL — ABNORMAL HIGH (ref 65–99)
Potassium: 3.7 mmol/L (ref 3.5–5.1)
SODIUM: 135 mmol/L (ref 135–145)
Total Protein: 6.7 g/dL (ref 6.5–8.1)

## 2017-07-24 LAB — GLUCOSE, CAPILLARY
GLUCOSE-CAPILLARY: 113 mg/dL — AB (ref 65–99)
Glucose-Capillary: 99 mg/dL (ref 65–99)
Glucose-Capillary: 109 mg/dL — ABNORMAL HIGH (ref 65–99)
Glucose-Capillary: 129 mg/dL — ABNORMAL HIGH (ref 65–99)

## 2017-07-24 LAB — HEPATITIS PANEL, ACUTE
HCV Ab: <0.1 s/co ratio (ref 0.0–0.9)
HEP B S AG: NEGATIVE
Hep A IgM: NEGATIVE
Hep B C IgM: NEGATIVE

## 2017-07-24 LAB — MAGNESIUM: Magnesium: 1.8 mg/dL (ref 1.7–2.4)

## 2017-07-24 MED ORDER — SORBITOL 70 % SOLN
960.0000 mL | TOPICAL_OIL | Freq: Once | ORAL | Status: AC
  Administered 2017-07-24: 960 mL via RECTAL
  Filled 2017-07-24: qty 473

## 2017-07-24 MED ORDER — POTASSIUM CHLORIDE CRYS ER 20 MEQ PO TBCR
40.0000 meq | EXTENDED_RELEASE_TABLET | Freq: Once | ORAL | Status: AC
  Administered 2017-07-24: 40 meq via ORAL
  Filled 2017-07-24: qty 2

## 2017-07-24 MED ORDER — SUMATRIPTAN SUCCINATE 100 MG PO TABS
100.0000 mg | ORAL_TABLET | Freq: Once | ORAL | Status: AC
  Administered 2017-07-25: 100 mg via ORAL
  Filled 2017-07-24: qty 1

## 2017-07-24 MED ORDER — METOCLOPRAMIDE HCL 5 MG PO TABS
5.0000 mg | ORAL_TABLET | Freq: Three times a day (TID) | ORAL | Status: DC
  Administered 2017-07-24 – 2017-07-25 (×3): 5 mg via ORAL
  Filled 2017-07-24 (×3): qty 1

## 2017-07-24 MED ORDER — MAGNESIUM CITRATE PO SOLN
1.0000 | Freq: Once | ORAL | Status: AC
  Administered 2017-07-24: 1 via ORAL
  Filled 2017-07-24: qty 296

## 2017-07-24 MED ORDER — PSYLLIUM 95 % PO PACK
1.0000 | PACK | Freq: Three times a day (TID) | ORAL | Status: DC
  Administered 2017-07-24 – 2017-07-25 (×3): 1 via ORAL
  Filled 2017-07-24 (×3): qty 1

## 2017-07-24 MED ORDER — METHOCARBAMOL 1000 MG/10ML IJ SOLN
500.0000 mg | Freq: Three times a day (TID) | INTRAVENOUS | Status: AC
  Administered 2017-07-24 – 2017-07-25 (×3): 500 mg via INTRAVENOUS
  Filled 2017-07-24 (×3): qty 550

## 2017-07-24 NOTE — Progress Notes (Signed)
Patient had large bowel movement after the Smog enema.  She couldn't tolerate the whole enema, gave probably 50-60%, she was already on the go and refused to take the remaining amount.

## 2017-07-24 NOTE — Evaluation (Signed)
Physical Therapy Evaluation Patient Details Name: Sabrina Aguirre MRN: [id number] DOB: [birth year] Today's Date: 07/24/2017   History of Present Illness  Sabrina Aguirre  is a 76 y.o. female, w hypertension, dm2, C/o intermittent left sided chest pain this am.  Slight dyspnea with the pain.  Took a bayer aspirin at 7:20 but without relief. And then took an antacid without relief.  Pt received nitro without relief. Pt denies fever, chills, cough, palp, n/v, diarrhea, brbpr.   Clinical Impression  Patient received in restroom attempting to have bowel movement however pleasant and willing to work with skilled PT services; she continues to be quite concerned about her lack of BM and was encouraged to openly communicate with MD/RN about this. Exam reveals patient is grossly at her baseline level of function. Patient evaluated by Physical Therapy with no further acute PT needs identified. All education has been completed and the patient has no further questions.  See below for any follow-up Physical Therapy or equipment needs. PT is signing off. Thank you for this referral.     Follow Up Recommendations No PT follow up    Equipment Recommendations  None recommended by PT    Recommendations for Other Services       Precautions / Restrictions Precautions Precautions: None Restrictions Weight Bearing Restrictions: No      Mobility  Bed Mobility Overal bed mobility: Modified Independent                Transfers Overall transfer level: Modified independent                  Ambulation/Gait Ambulation/Gait assistance: Modified independent (Device/Increase time) Ambulation Distance (Feet): 150 Feet Assistive device: None Gait Pattern/deviations: WFL(Within Functional Limits);Step-through pattern        Stairs            Wheelchair Mobility    Modified Rankin (Stroke Patients Only)       Balance Overall balance assessment: No apparent balance deficits (not  formally assessed)                                           Pertinent Vitals/Pain Pain Assessment: 0-10 Pain Score: 10-Worst pain ever Pain Location: chest (patient presentation not consistent with 10/10 pain) Pain Descriptors / Indicators: Aching Pain Intervention(s): Monitored during session;Limited activity within patient's tolerance    Home Living Family/patient expects to be discharged to:: Private residence Living Arrangements: Alone   Type of Home: House Home Access: Level entry     Home Layout: One level Home Equipment: Cane - single point Additional Comments: she reports she was using it but stopped because she was afraid she would get weak     Prior Function Level of Independence: Independent               Hand Dominance        Extremity/Trunk Assessment   Upper Extremity Assessment Upper Extremity Assessment: Overall WFL for tasks assessed    Lower Extremity Assessment Lower Extremity Assessment: Overall WFL for tasks assessed    Cervical / Trunk Assessment Cervical / Trunk Assessment: Kyphotic  Communication   Communication: No difficulties  Cognition Arousal/Alertness: Awake/alert Behavior During Therapy: WFL for tasks assessed/performed Overall Cognitive Status: Within Functional Limits for tasks assessed  General Comments General comments (skin integrity, edema, etc.): patient appears generally at baseline level of function     Exercises     Assessment/Plan    PT Assessment Patent does not need any further PT services  PT Problem List         PT Treatment Interventions      PT Goals (Current goals can be found in the Care Plan section)  Acute Rehab PT Goals Patient Stated Goal: to go home, to have bowel movement  PT Goal Formulation: With patient Time For Goal Achievement: 08/07/17 Potential to Achieve Goals: Good    Frequency     Barriers to  discharge        Co-evaluation               AM-PAC PT "6 Clicks" Daily Activity  Outcome Measure Difficulty turning over in bed (including adjusting bedclothes, sheets and blankets)?: None Difficulty moving from lying on back to sitting on the side of the bed? : None Difficulty sitting down on and standing up from a chair with arms (e.g., wheelchair, bedside commode, etc,.)?: None Help needed moving to and from a bed to chair (including a wheelchair)?: None Help needed walking in hospital room?: None Help needed climbing 3-5 steps with a railing? : None 6 Click Score: 24    End of Session   Activity Tolerance: Patient tolerated treatment well Patient left: Other (comment)(patient returned to restroom at end of session )        Time: 1550-1600 PT Time Calculation (min) (ACUTE ONLY): 10 min   Charges:   PT Evaluation $PT Eval Low Complexity: 1 Low     PT G Codes:   PT G-Codes **NOT FOR INPATIENT CLASS** Functional Assessment Tool Used: AM-PAC 6 Clicks Basic Mobility;Clinical judgement Functional Limitation: Mobility: Walking and moving around Mobility: Walking and Moving Around Current Status (D1761): 0 percent impaired, limited or restricted Mobility: Walking and Moving Around Goal Status (Y0737): 0 percent impaired, limited or restricted Mobility: Walking and Moving Around Discharge Status (T0626): 0 percent impaired, limited or restricted    Deniece Ree PT, DPT, CBIS  Supplemental Physical Therapist [place]
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
## 2017-07-25 DIAGNOSIS — R945 Abnormal results of liver function studies: Secondary | ICD-10-CM | POA: Diagnosis not present

## 2017-07-25 DIAGNOSIS — E876 Hypokalemia: Secondary | ICD-10-CM

## 2017-07-25 DIAGNOSIS — D3500 Benign neoplasm of unspecified adrenal gland: Secondary | ICD-10-CM | POA: Diagnosis not present

## 2017-07-25 DIAGNOSIS — R7989 Other specified abnormal findings of blood chemistry: Secondary | ICD-10-CM

## 2017-07-25 DIAGNOSIS — I10 Essential (primary) hypertension: Secondary | ICD-10-CM | POA: Diagnosis not present

## 2017-07-25 DIAGNOSIS — E118 Type 2 diabetes mellitus with unspecified complications: Secondary | ICD-10-CM

## 2017-07-25 LAB — GLUCOSE, CAPILLARY
GLUCOSE-CAPILLARY: 111 mg/dL — AB (ref 65–99)
GLUCOSE-CAPILLARY: 168 mg/dL — AB (ref 65–99)

## 2017-07-25 LAB — BASIC METABOLIC PANEL
Anion gap: 12 (ref 5–15)
BUN: 11 mg/dL (ref 6–20)
CALCIUM: 9 mg/dL (ref 8.9–10.3)
CHLORIDE: 100 mmol/L — AB (ref 101–111)
CO2: 24 mmol/L (ref 22–32)
CREATININE: 0.94 mg/dL (ref 0.44–1.00)
GFR, EST NON AFRICAN AMERICAN: 57 mL/min — AB (ref >60)
Glucose, Bld: 127 mg/dL — ABNORMAL HIGH (ref 65–99)
Potassium: 3.5 mmol/L (ref 3.5–5.1)
SODIUM: 136 mmol/L (ref 135–145)

## 2017-07-25 MED ORDER — TRAMADOL HCL 50 MG PO TABS: 50.0000 mg | ORAL_TABLET | Freq: Four times a day (QID) | ORAL | 0 refills | Status: DC | PRN

## 2017-07-25 MED ORDER — IRBESARTAN 300 MG PO TABS: 300.0000 mg | ORAL_TABLET | Freq: Every day | ORAL | 0 refills | Status: DC

## 2017-07-25 MED ORDER — HYDROCHLOROTHIAZIDE 12.5 MG PO CAPS: 12.5000 mg | ORAL_CAPSULE | Freq: Every day | ORAL | 0 refills | Status: DC

## 2017-07-25 MED ORDER — PANTOPRAZOLE SODIUM 40 MG PO TBEC: 40.0000 mg | DELAYED_RELEASE_TABLET | Freq: Two times a day (BID) | ORAL | 0 refills | Status: DC

## 2017-07-25 MED ORDER — METOCLOPRAMIDE HCL 5 MG PO TABS: 5.0000 mg | ORAL_TABLET | Freq: Three times a day (TID) | ORAL | 0 refills | Status: DC

## 2017-07-25 MED ORDER — POTASSIUM CHLORIDE CRYS ER 20 MEQ PO TBCR
40.0000 meq | EXTENDED_RELEASE_TABLET | Freq: Once | ORAL | Status: AC
  Administered 2017-07-25: 40 meq via ORAL
  Filled 2017-07-25: qty 2
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
## 2017-07-25 NOTE — Progress Notes (Signed)
Patient Discharge: Disposition: Patient discharged to home. Education: Reviewed medications, prescriptions, follow-up appointments and discharge instructions, verbalized understanding. IV: Discontinued IV before discharge. Telemetry: N/A Transportation: Patient escorted out of the unit in w/c till the ride. Belongings: Patient took all her belongings with her.  

## 2017-08-10 ENCOUNTER — Telehealth: Payer: Self-pay | Admitting: Cardiovascular Disease

## 2017-08-10 NOTE — Telephone Encounter (Signed)
Closed encounter °

## 2017-09-07 ENCOUNTER — Encounter: Payer: Self-pay | Admitting: Cardiology

## 2017-09-07 ENCOUNTER — Encounter (INDEPENDENT_AMBULATORY_CARE_PROVIDER_SITE_OTHER): Payer: Self-pay

## 2017-09-07 ENCOUNTER — Ambulatory Visit: Payer: Medicare Other | Admitting: Cardiology

## 2017-09-07 DIAGNOSIS — I10 Essential (primary) hypertension: Secondary | ICD-10-CM | POA: Diagnosis not present

## 2017-09-07 DIAGNOSIS — E119 Type 2 diabetes mellitus without complications: Secondary | ICD-10-CM

## 2017-09-07 DIAGNOSIS — R0789 Other chest pain: Secondary | ICD-10-CM

## 2017-09-07 NOTE — Assessment & Plan Note (Signed)
No retinopathy, nephropathy, or neuropathy

## 2017-09-07 NOTE — Patient Instructions (Signed)
Medication Instructions:   Your physician recommends that you continue on your current medications as directed. Please refer to the Current Medication list given to you today.  Labwork:  NONE  Testing/Procedures:  NONE  Follow-Up:  Your physician recommends that you schedule a follow-up appointment in: as needed.   Any Other Special Instructions Will Be Listed Below (If Applicable).  If you need a refill on your cardiac medications before your next appointment, please call your pharmacy. 

## 2017-09-07 NOTE — Assessment & Plan Note (Signed)
Atypical for ischemia. Troponin negative, echo normal.
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
## 2017-09-07 NOTE — Assessment & Plan Note (Signed)
Controlled- 112/74 by me

## 2018-03-18 ENCOUNTER — Other Ambulatory Visit: Payer: Self-pay | Admitting: Family Medicine

## 2018-03-18 DIAGNOSIS — Z1231 Encounter for screening mammogram for malignant neoplasm of breast: Secondary | ICD-10-CM

## 2018-04-29 ENCOUNTER — Ambulatory Visit
Admission: RE | Admit: 2018-04-29 | Discharge: 2018-04-29 | Disposition: A | Discharge status: Home / Self Care | Payer: Medicare Other | Source: Ambulatory Visit | Attending: Family Medicine | Admitting: Family Medicine

## 2018-04-29 DIAGNOSIS — Z1231 Encounter for screening mammogram for malignant neoplasm of breast: Secondary | ICD-10-CM

## 2019-01-15 IMAGING — DX DG CHEST 2V
2 series · 2 of 2 positions shown · non-contrast
Comparison: 03/19/2015

CLINICAL DATA: 76-year-old female with acute chest pain and
shortness of breath today.

EXAM:
CHEST  2 VIEW

[chest pa]
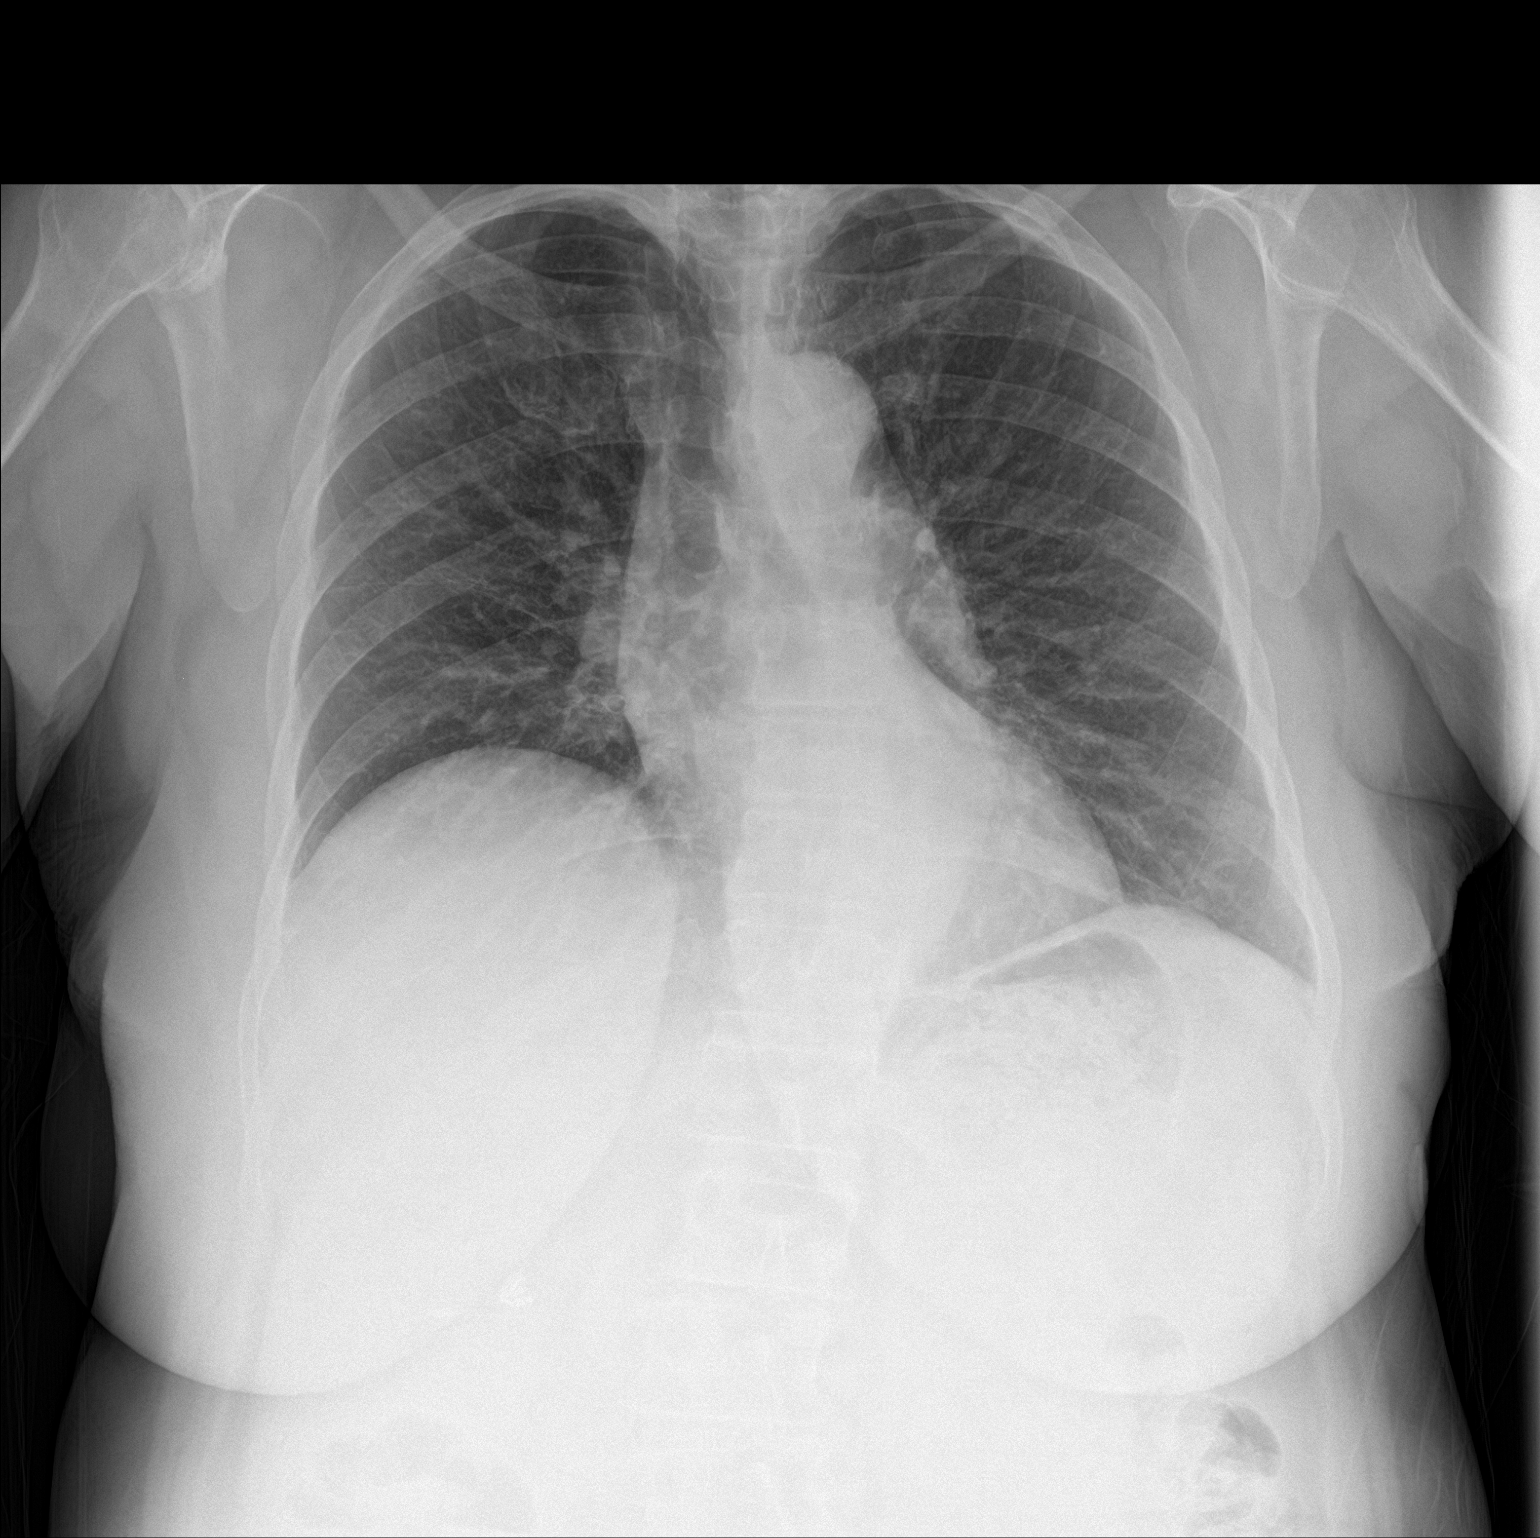

[chest lat]
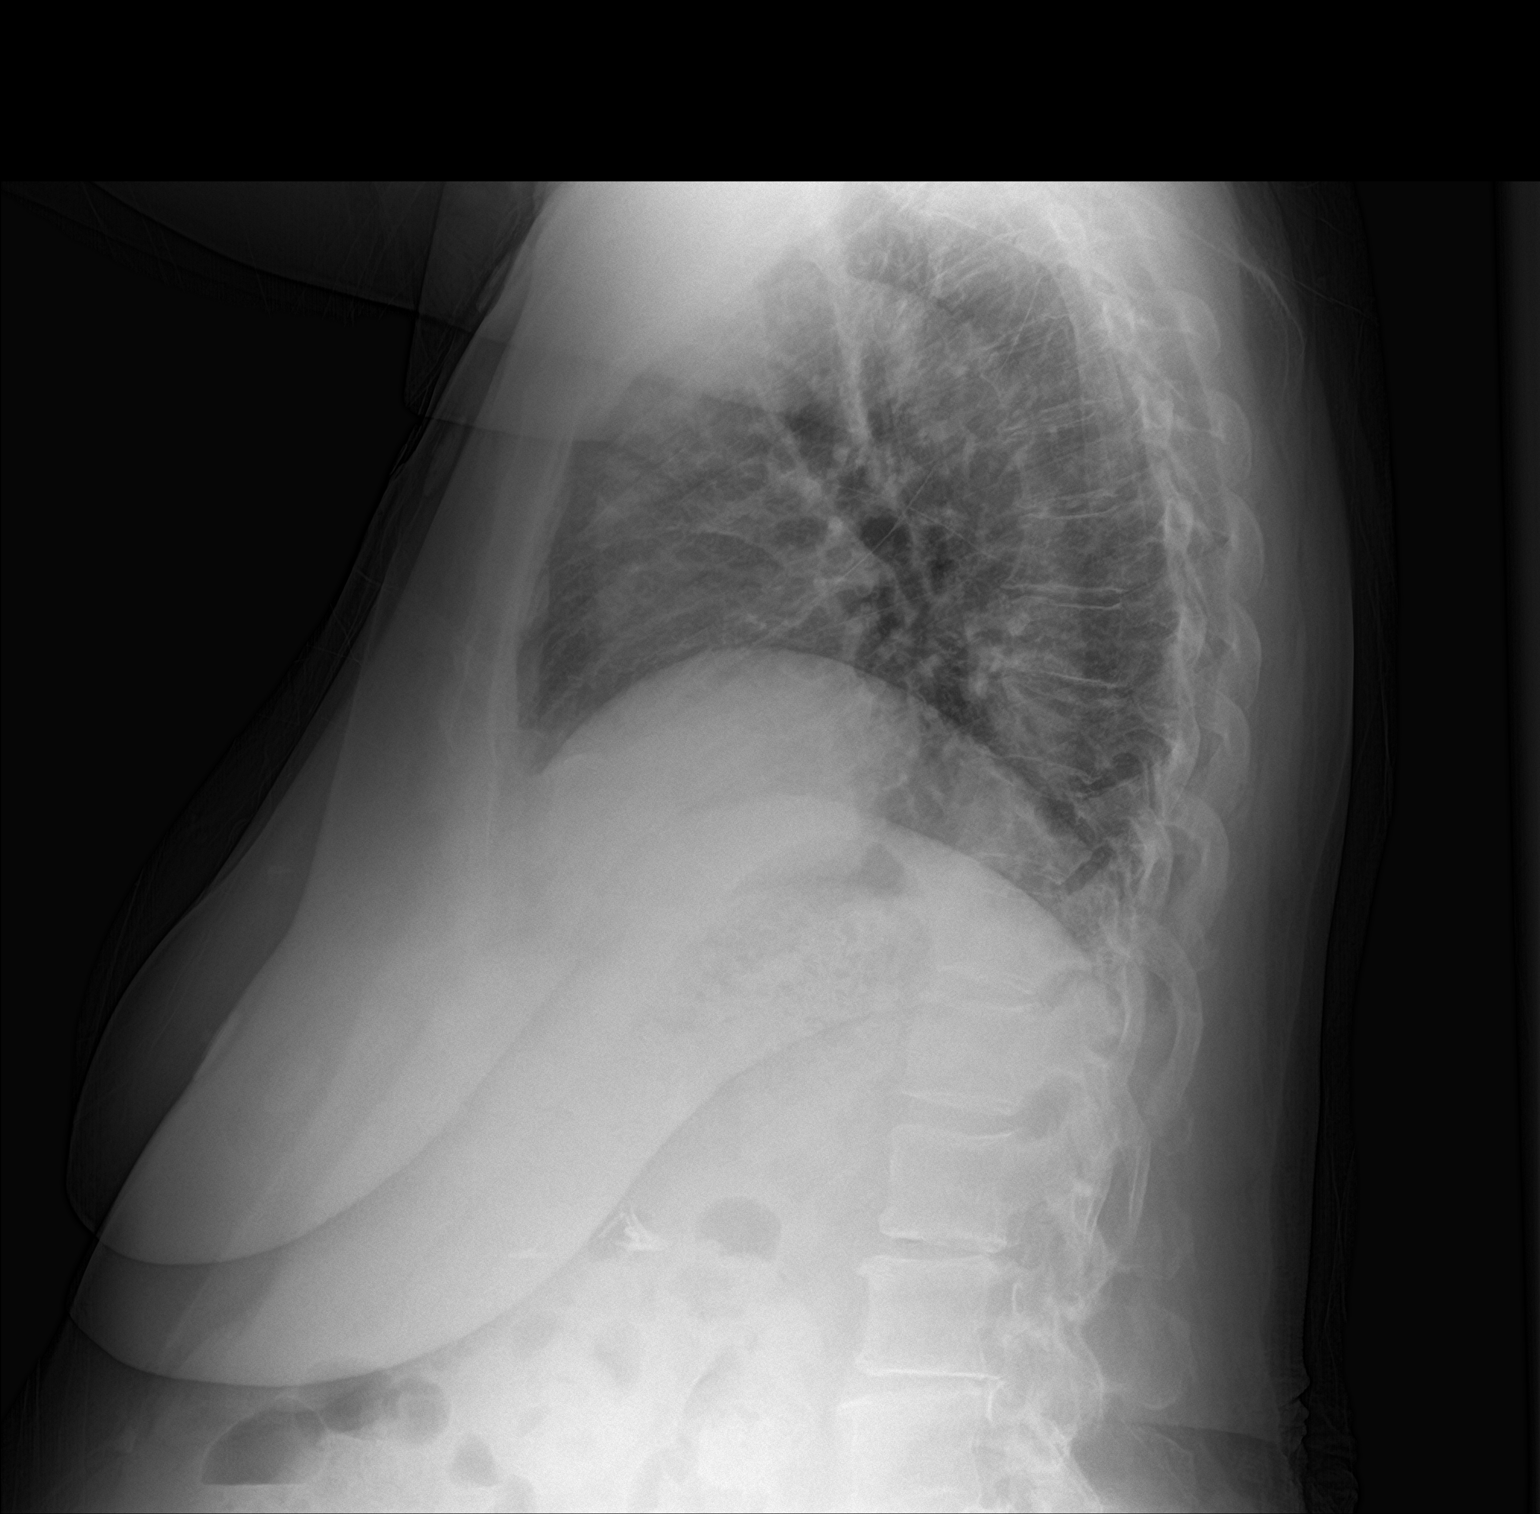

[2 of 2 positions shown; findings below may reference images not displayed]

FINDINGS: The cardiomediastinal silhouette is unremarkable.

Elevation of the right hemidiaphragm again noted.

There is no evidence of focal airspace disease, pulmonary edema,
suspicious pulmonary nodule/mass, pleural effusion, or pneumothorax.
No acute bony abnormalities are identified.
IMPRESSION: No active cardiopulmonary disease.

## 2019-03-28 ENCOUNTER — Other Ambulatory Visit: Payer: Self-pay | Admitting: Family Medicine

## 2019-03-28 DIAGNOSIS — Z1231 Encounter for screening mammogram for malignant neoplasm of breast: Secondary | ICD-10-CM

## 2019-05-13 ENCOUNTER — Ambulatory Visit
Admission: RE | Admit: 2019-05-13 | Discharge: 2019-05-13 | Disposition: A | Discharge status: Home / Self Care | Payer: Medicare Other | Source: Ambulatory Visit | Attending: Family Medicine | Admitting: Family Medicine

## 2019-05-13 ENCOUNTER — Other Ambulatory Visit: Payer: Self-pay

## 2019-05-13 DIAGNOSIS — Z1231 Encounter for screening mammogram for malignant neoplasm of breast: Secondary | ICD-10-CM

## 2019-05-14 ENCOUNTER — Other Ambulatory Visit: Payer: Self-pay | Admitting: Family Medicine

## 2019-05-14 DIAGNOSIS — R928 Other abnormal and inconclusive findings on diagnostic imaging of breast: Secondary | ICD-10-CM

## 2019-05-16 ENCOUNTER — Other Ambulatory Visit: Payer: Self-pay

## 2019-05-16 ENCOUNTER — Other Ambulatory Visit: Payer: Self-pay | Admitting: Family Medicine

## 2019-05-16 ENCOUNTER — Ambulatory Visit
Admission: RE | Admit: 2019-05-16 | Discharge: 2019-05-16 | Disposition: A | Discharge status: Home / Self Care | Payer: Medicare Other | Source: Ambulatory Visit | Attending: Family Medicine | Admitting: Family Medicine

## 2019-05-16 DIAGNOSIS — R928 Other abnormal and inconclusive findings on diagnostic imaging of breast: Secondary | ICD-10-CM

## 2019-05-16 DIAGNOSIS — N6320 Unspecified lump in the left breast, unspecified quadrant: Secondary | ICD-10-CM

## 2019-05-21 ENCOUNTER — Other Ambulatory Visit: Payer: Self-pay | Admitting: Diagnostic Radiology

## 2019-05-21 ENCOUNTER — Ambulatory Visit
Admission: RE | Admit: 2019-05-21 | Discharge: 2019-05-21 | Disposition: A | Discharge status: Home / Self Care | Payer: Medicare Other | Source: Ambulatory Visit | Attending: Family Medicine | Admitting: Family Medicine

## 2019-05-21 ENCOUNTER — Other Ambulatory Visit: Payer: Self-pay

## 2019-05-21 DIAGNOSIS — N6320 Unspecified lump in the left breast, unspecified quadrant: Secondary | ICD-10-CM

## 2019-10-16 ENCOUNTER — Other Ambulatory Visit: Payer: Self-pay | Admitting: Family Medicine

## 2019-10-16 DIAGNOSIS — R921 Mammographic calcification found on diagnostic imaging of breast: Secondary | ICD-10-CM

## 2019-10-22 ENCOUNTER — Telehealth: Payer: Self-pay | Admitting: General Practice

## 2019-10-22 NOTE — Telephone Encounter (Signed)
° °  Pt requesting if she can bring her daughter to her appt on 10/27/19. She said she's 79 years old and hard of hearing, and will be comfortable if her daughter can be there with her.  Please advise

## 2019-10-27 ENCOUNTER — Ambulatory Visit: Payer: Medicare Other | Admitting: General Practice

## 2019-11-06 ENCOUNTER — Telehealth: Payer: Self-pay | Admitting: General Practice

## 2019-11-06 NOTE — Telephone Encounter (Signed)
Patient calling to request her daughter Mariam Dollar come with her to her appointment on 11/10/2019, because her heart has been racing and she is 79 years old.

## 2019-11-06 NOTE — Telephone Encounter (Signed)
Spoke with pt, aware would prefer we call her daughter from the room. Pt agreed with this plan.
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
## 2019-11-10 ENCOUNTER — Ambulatory Visit: Payer: Medicare PPO | Admitting: General Practice

## 2019-11-10 ENCOUNTER — Encounter: Payer: Self-pay | Admitting: *Deleted

## 2019-11-10 ENCOUNTER — Other Ambulatory Visit: Payer: Self-pay

## 2019-11-10 ENCOUNTER — Encounter: Payer: Self-pay | Admitting: General Practice

## 2019-11-10 VITALS — BP 142/94 | HR 102 | Temp 97.4°F | Ht 64.0 in | Wt 181.0 lb

## 2019-11-10 DIAGNOSIS — R0789 Other chest pain: Secondary | ICD-10-CM | POA: Diagnosis not present

## 2019-11-10 DIAGNOSIS — I10 Essential (primary) hypertension: Secondary | ICD-10-CM

## 2019-11-10 DIAGNOSIS — E118 Type 2 diabetes mellitus with unspecified complications: Secondary | ICD-10-CM | POA: Diagnosis not present

## 2019-11-10 DIAGNOSIS — R002 Palpitations: Secondary | ICD-10-CM

## 2019-11-10 NOTE — Patient Instructions (Signed)
Medication Instructions:  The current medical regimen is effective;  continue present plan and medications as directed. Please refer to the Current Medication list given to you today.  *If you need a refill on your cardiac medications before your next appointment, please call your pharmacy*  Lab Work: TSH AND BMET TODAY If you have labs (blood work) drawn today and your tests are completely normal, you will receive your results only by:  [place] (if you have MyChart) OR A paper copy in the mail.  If you have any lab test that is abnormal or we need to change your treatment, we will call you to review the results.  Follow-Up: Your next appointment:  4-6 week(s)  In Person with JESSE CLEAVER, FNP-C  At [facility], you and your health needs are our priority.  As part of our continuing mission to provide you with exceptional heart care, we have created designated Provider Care Teams.  These Care Teams include your primary Cardiologist (physician) and Advanced Practice Providers (APPs -  Physician Assistants and Nurse Practitioners) who all work together to provide you with the care you need, when you need it.  We recommend signing up for the patient portal called "MyChart".  Sign up information is provided on this After Visit Summary.  MyChart is used to connect with patients for Virtual Visits (Telemedicine).  Patients are able to view lab/test results, encounter notes, upcoming appointments, etc.  Non-urgent messages can be sent to your provider as well.   To learn more about what you can do with MyChart, go to [email or web address].    Sinus Tachycardia  Sinus tachycardia is a kind of fast heartbeat. In sinus tachycardia, the heart beats more than 100 times a minute. Sinus tachycardia starts in a part of the heart called the sinus node. Sinus tachycardia may be harmless, or it may be a sign of a serious condition. What are the causes? This condition may be caused by:  Exercise or  exertion.  A fever.  Pain.  Loss of body fluids (dehydration).  Severe bleeding (hemorrhage).  Anxiety and stress.  Certain substances, including: ? Alcohol. ? Caffeine. ? Tobacco and nicotine products. ? Cold medicines. ? Illegal drugs.  Medical conditions including: ? Heart disease. ? An infection. ? An overactive thyroid (hyperthyroidism). ? A lack of red blood cells (anemia). What are the signs or symptoms? Symptoms of this condition include:  A feeling that the heart is beating quickly (palpitations).  Suddenly noticing your heartbeat (cardiac awareness).  Dizziness.  Tiredness (fatigue).  Shortness of breath.  Chest pain.  Nausea.  Fainting. How is this diagnosed? This condition is diagnosed with:  A physical exam.  Other tests, such as: ? Blood tests. ? An electrocardiogram (ECG). This test measures the electrical activity of the heart. ? Ambulatory cardiac monitor. This records your heartbeats for 24 hours or more. You may be referred to a heart specialist (cardiologist). How is this treated? Treatment for this condition depends on the cause or the underlying condition. Treatment may involve:  Treating the underlying condition.  Taking new medicines or changing your current medicines as told by your health care provider.  Making changes to your diet or lifestyle. Follow these instructions at home: Lifestyle   Do not use any products that contain nicotine or tobacco, such as cigarettes and e-cigarettes. If you need help quitting, ask your health care provider.  Do not use illegal drugs, such as cocaine.  Learn relaxation methods to help you when you  get stressed or anxious. These include deep breathing.  Avoid caffeine or other stimulants. Alcohol use   Do not drink alcohol if: ? Your health care provider tells you not to drink. ? You are pregnant, may be pregnant, or are planning to become pregnant.  If you drink alcohol, limit how  much you have: ? 0-1 drink a day for women. ? 0-2 drinks a day for men.  Be aware of how much alcohol is in your drink. In the [place] one drink equals one typical bottle of beer (12 oz), one-half glass of wine (5 oz), or one shot of hard liquor (1 oz). General instructions  Drink enough fluids to keep your urine pale yellow.  Take over-the-counter and prescription medicines only as told by your health care provider.  Keep all follow-up visits as told by your health care provider. This is important. Contact a health care provider if you have:  A fever.  Vomiting or diarrhea that does not go away. Get help right away if you:  Have pain in your chest, upper arms, jaw, or neck.  Become weak or dizzy.  Feel faint.  Have palpitations that do not go away. Summary  In sinus tachycardia, the heart beats more than 100 times a minute.  Sinus tachycardia may be harmless, or it may be a sign of a serious condition.  Treatment for this condition depends on the cause or the underlying condition.  Get help right away if you have pain in your chest, upper arms, jaw, or neck. This information is not intended to replace advice given to you by your health care provider. Make sure you discuss any questions you have with your health care provider.

## 2019-11-10 NOTE — Progress Notes (Signed)
Patient ID: Sabrina Aguirre, female   DOB: [birth year], 79 y.o.   MRN: [id number] Patient enrolled for [employer] to mail a 14 day ZIO XT long term holter monitor to her home.  Instructions mailed to patient and will also be included in her monitor kit.

## 2019-11-11 ENCOUNTER — Ambulatory Visit: Payer: Medicare Other | Admitting: Family Medicine

## 2019-11-11 LAB — BASIC METABOLIC PANEL
BUN/Creatinine Ratio: 15 (ref 12–28)
BUN: 11 mg/dL (ref 8–27)
CO2: 25 mmol/L (ref 20–29)
Calcium: 10.7 mg/dL — ABNORMAL HIGH (ref 8.7–10.3)
Chloride: 94 mmol/L — ABNORMAL LOW (ref 96–106)
Creatinine, Ser: 0.71 mg/dL (ref 0.57–1.00)
GFR calc Af Amer: 93 mL/min/{1.73_m2} (ref >59)
GFR calc non Af Amer: 81 mL/min/{1.73_m2} (ref >59)
Glucose: 99 mg/dL (ref 65–99)
Potassium: 3.7 mmol/L (ref 3.5–5.2)
Sodium: 135 mmol/L (ref 134–144)

## 2019-11-11 LAB — TSH: TSH: 1.15 u[IU]/mL (ref 0.450–4.500)

## 2019-11-11 NOTE — Progress Notes (Signed)
Thanks, Denyse Amass. Sounds like she would do better without diuretic and with more beta blocker.  Mildly hypercalcemic (which can be thiazide related). ECG not scanned in yet, so I cannot comment. Please clarify whether on irbesartan or olmesartan, both are listed.

## 2019-11-15 ENCOUNTER — Other Ambulatory Visit (INDEPENDENT_AMBULATORY_CARE_PROVIDER_SITE_OTHER): Payer: Medicare PPO

## 2019-11-15 DIAGNOSIS — R002 Palpitations: Secondary | ICD-10-CM | POA: Diagnosis not present

## 2019-11-19 ENCOUNTER — Ambulatory Visit: Payer: Medicare Other | Admitting: Cardiology

## 2019-11-26 ENCOUNTER — Other Ambulatory Visit: Payer: Medicare Other

## 2019-12-15 ENCOUNTER — Other Ambulatory Visit: Payer: Self-pay

## 2019-12-15 MED ORDER — METOPROLOL TARTRATE 25 MG PO TABS: 25.0000 mg | ORAL_TABLET | Freq: Two times a day (BID) | ORAL | 3 refills | Status: DC
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
## 2019-12-24 ENCOUNTER — Other Ambulatory Visit: Payer: Self-pay

## 2019-12-24 ENCOUNTER — Encounter: Payer: Self-pay | Admitting: General Practice

## 2019-12-24 ENCOUNTER — Ambulatory Visit: Payer: Medicare PPO | Admitting: General Practice

## 2019-12-24 VITALS — BP 152/82 | HR 79 | Temp 97.4°F | Resp 20 | Ht 64.0 in | Wt 180.4 lb

## 2019-12-24 DIAGNOSIS — R0789 Other chest pain: Secondary | ICD-10-CM | POA: Diagnosis not present

## 2019-12-24 DIAGNOSIS — R002 Palpitations: Secondary | ICD-10-CM | POA: Diagnosis not present

## 2019-12-24 DIAGNOSIS — I10 Essential (primary) hypertension: Secondary | ICD-10-CM

## 2019-12-24 DIAGNOSIS — E118 Type 2 diabetes mellitus with unspecified complications: Secondary | ICD-10-CM

## 2019-12-24 NOTE — Patient Instructions (Signed)
Medication Instructions:  Your physician recommends that you continue on your current medications as directed. Please refer to the Current Medication list given to you today.  *If you need a refill on your cardiac medications before your next appointment, please call your pharmacy*   Follow-Up: At [facility], you and your health needs are our priority.  As part of our continuing mission to provide you with exceptional heart care, we have created designated Provider Care Teams.  These Care Teams include your primary Cardiologist (physician) and Advanced Practice Providers (APPs -  Physician Assistants and Nurse Practitioners) who all work together to provide you with the care you need, when you need it.  We recommend signing up for the patient portal called "MyChart".  Sign up information is provided on this After Visit Summary.  MyChart is used to connect with patients for Virtual Visits (Telemedicine).  Patients are able to view lab/test results, encounter notes, upcoming appointments, etc.  Non-urgent messages can be sent to your provider as well.   To learn more about what you can do with MyChart, go to [email or web address].    Your next appointment:   3 month(s)  The format for your next appointment:   In Person  Provider:   Sanda Klein, MD   Other Instructions  Denyse Amass FNP recommends that you monitor your blood pressure at home at least once weekly. Please check 1-2 hours after taking your BP medication

## 2019-12-26 ENCOUNTER — Ambulatory Visit: Payer: Self-pay

## 2019-12-26 ENCOUNTER — Ambulatory Visit
Admission: RE | Admit: 2019-12-26 | Discharge: 2019-12-26 | Disposition: A | Discharge status: Home / Self Care | Payer: Medicare PPO | Source: Ambulatory Visit | Attending: Family Medicine | Admitting: Family Medicine

## 2019-12-26 ENCOUNTER — Other Ambulatory Visit: Payer: Self-pay

## 2019-12-26 DIAGNOSIS — R921 Mammographic calcification found on diagnostic imaging of breast: Secondary | ICD-10-CM

## 2020-01-22 ENCOUNTER — Other Ambulatory Visit: Payer: Self-pay

## 2020-01-23 ENCOUNTER — Ambulatory Visit (INDEPENDENT_AMBULATORY_CARE_PROVIDER_SITE_OTHER): Payer: Medicare PPO | Admitting: Family Medicine

## 2020-01-23 ENCOUNTER — Encounter: Payer: Self-pay | Admitting: Family Medicine

## 2020-01-23 VITALS — BP 140/84 | HR 75 | Temp 97.6°F | Resp 15 | Ht 64.0 in | Wt 179.8 lb

## 2020-01-23 DIAGNOSIS — E119 Type 2 diabetes mellitus without complications: Secondary | ICD-10-CM

## 2020-01-23 DIAGNOSIS — M85859 Other specified disorders of bone density and structure, unspecified thigh: Secondary | ICD-10-CM

## 2020-01-23 DIAGNOSIS — E2839 Other primary ovarian failure: Secondary | ICD-10-CM

## 2020-01-23 DIAGNOSIS — I10 Essential (primary) hypertension: Secondary | ICD-10-CM

## 2020-01-23 DIAGNOSIS — G43009 Migraine without aura, not intractable, without status migrainosus: Secondary | ICD-10-CM

## 2020-01-23 LAB — COMPREHENSIVE METABOLIC PANEL
ALT: 24 U/L (ref 0–35)
AST: 22 U/L (ref 0–37)
Albumin: 4.5 g/dL (ref 3.5–5.2)
Alkaline Phosphatase: 77 U/L (ref 39–117)
BUN: 12 mg/dL (ref 6–23)
CO2: 30 mEq/L (ref 19–32)
Calcium: 10.1 mg/dL (ref 8.4–10.5)
Chloride: 98 mEq/L (ref 96–112)
Creatinine, Ser: 0.74 mg/dL (ref 0.40–1.20)
GFR: 91.3 mL/min (ref >60.00)
Glucose, Bld: 127 mg/dL — ABNORMAL HIGH (ref 70–99)
Potassium: 4.1 mEq/L (ref 3.5–5.1)
Sodium: 135 mEq/L (ref 135–145)
Total Bilirubin: 0.6 mg/dL (ref 0.2–1.2)
Total Protein: 7.2 g/dL (ref 6.0–8.3)

## 2020-01-23 LAB — POCT GLYCOSYLATED HEMOGLOBIN (HGB A1C): Hemoglobin A1C: 6 % — AB (ref 4.0–5.6)

## 2020-01-23 LAB — LIPID PANEL
Cholesterol: 94 mg/dL (ref 0–200)
HDL: 48.5 mg/dL (ref >39.00)
LDL Cholesterol: 23 mg/dL (ref 0–99)
NonHDL: 45.88
Total CHOL/HDL Ratio: 2
Triglycerides: 113 mg/dL (ref 0.0–149.0)
VLDL: 22.6 mg/dL (ref 0.0–40.0)

## 2020-01-23 MED ORDER — PANTOPRAZOLE SODIUM 40 MG PO TBEC: 40.0000 mg | DELAYED_RELEASE_TABLET | ORAL | Status: DC | PRN

## 2020-01-23 MED ORDER — SUMATRIPTAN SUCCINATE 100 MG PO TABS: 50.0000 mg | ORAL_TABLET | ORAL | Status: DC | PRN

## 2020-01-23 NOTE — Patient Instructions (Addendum)
It was so good seeing you again! Thank you for establishing with my new practice and allowing me to continue caring for you. It means a lot to me.   Please schedule a follow up appointment with me in 3 months for diabetes follow up  You may stop your metformin. Keep checking your blood pressures at least once a week and write down the number.   Please sign up for Mychart. I have texted you the link. I will release your lab results to you on your MyChart account with further instructions. Please reply with any questions.   I have ordered a mammogram and/or bone density for you as we discussed today: []   Mammogram  [x]   Bone Density  Please call the office checked below to schedule your appointment: Your appointment will at the following location  [x]   [facility]      [place], [place]         []   [facility]  [place], [place]  Make sure to wear two peace clothing  No lotions powders or deodorants the day of the appointment Make sure to bring picture ID and insurance card.  Bring list of medications you are currently taking including any supplements.
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
## 2020-01-26 LAB — PTH, INTACT AND CALCIUM
Calcium: 10.3 mg/dL (ref 8.6–10.4)
PTH: 34 pg/mL (ref 14–64)

## 2020-01-26 NOTE — Progress Notes (Signed)
Please call patient: I have reviewed his/her lab results. All lab work results look good. No changes recommended at this time. Follow up as directed.

## 2020-02-03 ENCOUNTER — Other Ambulatory Visit: Payer: Self-pay

## 2020-02-03 MED ORDER — HYDROCHLOROTHIAZIDE 12.5 MG PO CAPS: 12.5000 mg | ORAL_CAPSULE | Freq: Every day | ORAL | 3 refills | Status: DC

## 2020-02-03 NOTE — Telephone Encounter (Signed)
Patient requesting refill for medication. Last script 07/26/17 from Dr. Dia Crawford. Please advise if refill is appropriate

## 2020-03-26 ENCOUNTER — Telehealth: Payer: Self-pay | Admitting: Family Medicine

## 2020-03-26 NOTE — Telephone Encounter (Signed)
Yes, can do telephone visit next week

## 2020-03-26 NOTE — Telephone Encounter (Signed)
Patient is calling and stated that she is experiencing a cough and sore throat and wanted to see if Dr. Jonni Sanger could call her something in to the pharmacy, please advise. CB is [phone]

## 2020-03-26 NOTE — Telephone Encounter (Signed)
Are you ok with doing a phone call with her on a different day? Please advise.

## 2020-03-26 NOTE — Telephone Encounter (Signed)
Patient has not been seen since June, will need an appt for any type of medications.

## 2020-03-26 NOTE — Telephone Encounter (Signed)
Patient has a appt with Dr. Jonni Sanger via telephone.

## 2020-03-26 NOTE — Telephone Encounter (Signed)
Spoke to patient and attempted to set up a mychart visit due to symptoms and patient stated that she is not familiar and doesn't know how to work her cell phone. Caller wanted to see if there was an alternative, please advise. CB is [phone]

## 2020-03-29 ENCOUNTER — Ambulatory Visit: Payer: Medicare PPO | Admitting: Family Medicine

## 2020-03-31 ENCOUNTER — Ambulatory Visit: Payer: Medicare PPO | Admitting: Cardiovascular Disease

## 2020-03-31 ENCOUNTER — Other Ambulatory Visit: Payer: Self-pay

## 2020-03-31 ENCOUNTER — Encounter: Payer: Self-pay | Admitting: Cardiovascular Disease

## 2020-03-31 VITALS — BP 155/88 | HR 74 | Ht 64.0 in | Wt 182.8 lb

## 2020-03-31 DIAGNOSIS — R002 Palpitations: Secondary | ICD-10-CM

## 2020-03-31 DIAGNOSIS — E119 Type 2 diabetes mellitus without complications: Secondary | ICD-10-CM

## 2020-03-31 DIAGNOSIS — I10 Essential (primary) hypertension: Secondary | ICD-10-CM | POA: Diagnosis not present

## 2020-03-31 MED ORDER — TELMISARTAN-HCTZ 80-25 MG PO TABS: 1.0000 | ORAL_TABLET | Freq: Every day | ORAL | 5 refills | Status: DC
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
## 2020-03-31 NOTE — Patient Instructions (Signed)
Medication Instructions:  STOP the Hydrochlorothiazide INCREASE the Telmisartan-Hydrochlorothiazide to 80-25 mg once daily  *If you need a refill on your cardiac medications before your next appointment, please call your pharmacy*   Lab Work: None ordered If you have labs (blood work) drawn today and your tests are completely normal, you will receive your results only by: Marland Kitchen MyChart Message (if you have MyChart) OR . A paper copy in the mail If you have any lab test that is abnormal or we need to change your treatment, we will call you to review the results.   Testing/Procedures: None ordered   Follow-Up: At [facility], you and your health needs are our priority.  As part of our continuing mission to provide you with exceptional heart care, we have created designated Provider Care Teams.  These Care Teams include your primary Cardiologist (physician) and Advanced Practice Providers (APPs -  Physician Assistants and Nurse Practitioners) who all work together to provide you with the care you need, when you need it.  We recommend signing up for the patient portal called "MyChart".  Sign up information is provided on this After Visit Summary.  MyChart is used to connect with patients for Virtual Visits (Telemedicine).  Patients are able to view lab/test results, encounter notes, upcoming appointments, etc.  Non-urgent messages can be sent to your provider as well.   To learn more about what you can do with MyChart, go to [email or web address].    Your next appointment:   Follow up in 6 months with Coletta Memos, NP Follow up in 12 months with Dr. Sallyanne Kuster   Other Instructions Dr. Sallyanne Kuster would like you to check your blood pressure daily for the next 2 weeks.  Keep a journal of these daily blood pressure and heart rate readings and call our office or send a message through [place] with the results. Thank you!

## 2020-04-01 ENCOUNTER — Telehealth: Payer: Self-pay | Admitting: Cardiovascular Disease

## 2020-04-01 NOTE — Telephone Encounter (Signed)
Spoke to patient, advised her telmisartan/hctz was increased to 80-25.    HCTZ stopped.   Patient aware.

## 2020-04-01 NOTE — Telephone Encounter (Signed)
Pt c/o medication issue:  1. Name of Medication:  telmisartan-hydrochlorothiazide (MICARDIS HCT) 80-25 MG tablet   2. How are you currently taking this medication (dosage and times per day)? Has not started yet  3. Are you having a reaction (difficulty breathing--STAT)? no  4. What is your medication issue? Patient states was taken off her hydrochlorothiazide 12.5 mg. She states she was taking telmisartan-hydrochlorothiazide 40-12.5 mg as well and would like to know if she supposed to take this with the 80-25 mg.

## 2020-04-07 ENCOUNTER — Other Ambulatory Visit: Payer: Self-pay

## 2020-04-07 ENCOUNTER — Ambulatory Visit
Admission: RE | Admit: 2020-04-07 | Discharge: 2020-04-07 | Disposition: A | Discharge status: Home / Self Care | Payer: Medicare PPO | Source: Ambulatory Visit | Attending: Family Medicine | Admitting: Family Medicine

## 2020-04-07 DIAGNOSIS — M85859 Other specified disorders of bone density and structure, unspecified thigh: Secondary | ICD-10-CM

## 2020-04-07 DIAGNOSIS — E2839 Other primary ovarian failure: Secondary | ICD-10-CM

## 2020-04-12 NOTE — Progress Notes (Signed)
Please call patient: I have reviewed his/her lab results. Bone density test shows mild low bone mass or osteopenia. Vit D and calcium supps daily are recommended. Can recheck in 2 years. Thanks.

## 2020-04-14 ENCOUNTER — Encounter: Payer: Self-pay | Admitting: Family Medicine

## 2020-04-14 ENCOUNTER — Other Ambulatory Visit: Payer: Self-pay

## 2020-04-14 ENCOUNTER — Telehealth (INDEPENDENT_AMBULATORY_CARE_PROVIDER_SITE_OTHER): Payer: Medicare PPO | Admitting: Family Medicine

## 2020-04-14 DIAGNOSIS — J0100 Acute maxillary sinusitis, unspecified: Secondary | ICD-10-CM

## 2020-04-14 MED ORDER — BENZONATATE 100 MG PO CAPS
100.0000 mg | ORAL_CAPSULE | Freq: Two times a day (BID) | ORAL | 0 refills | Status: DC | PRN
Start: 2020-04-14 — End: 2020-04-27

## 2020-04-14 MED ORDER — AMOXICILLIN-POT CLAVULANATE 875-125 MG PO TABS
1.0000 | ORAL_TABLET | Freq: Two times a day (BID) | ORAL | 0 refills | Status: DC
Start: 2020-04-14 — End: 2020-04-27

## 2020-04-14 NOTE — Progress Notes (Signed)
TELEPHONE ENCOUNTER   Patient verbally agreed to telephone visit and is aware that copayment and coinsurance may apply. Patient was treated using telemedicine according to accepted telemedicine protocols.  Location of the patient: home Location of provider: [facility], [place] of all persons participating in the telemedicine service and role in the encounter: [name], MD Maryelizabeth Kaufmann, CMA   Subjective  CC:  Chief Complaint  Patient presents with  . Cough    Patient believes that she has a "real bad sinus infection". She stated that she has some post nasal drip along with cough. She mentioned that she was prescribed something in the past from Dr. Jonni Sanger when she was at [facility]    HPI: Sabrina Aguirre is a 79 y.o. female who was telephoned today to address the problems listed above in the chief complaint.  79 year old complains of 3 weeks of sinus symptoms including facial pain, thick drainage from the back of her throat causing coughing episodes without fevers chills or shortness of breath.  She does have malaise and decreased energy.  She reports her blood pressure is just above normal which is atypical for her.  She has diabetes but sugars are running okay.  Appetite is fine.  No GI symptoms.  She has been vaccinated against Covid.  She has no sick contacts.  She has had sinus infections in the past that responded well to antibiotics although it has been years ago.   ASSESSMENT: 1. Acute non-recurrent maxillary sinusitis      Possible sinusitis: Discussed possibility of acute sinusitis and will treat with antibiotics and cough Perles.  Patient will monitor her symptoms closely.  She will return if not improving over the next 7 to 10 days.  She will call me if she develops shortness of breath or any emergency symptoms.  We have follow-up scheduled in 2 weeks Time spent with the patient (non face-to-face time during this virtual encounter): 14 minutes,  spent in obtaining information about her symptoms, reviewing her previous labs, evaluations, and treatments, counseling her about her condition (please see the discussed topics above), and developing a plan to further investigate it; the patient was provided an opportunity to ask questions and all were answered. The patient agreed with the plan and demonstrated an understanding of the instructions.   The patient was advised to call back or seek an in-person evaluation if the symptoms worsen or if the condition fails to improve as anticipated.  Follow up:   04/27/2020  No orders of the defined types were placed in this encounter.  Meds ordered this encounter  Medications  . benzonatate (TESSALON) 100 MG capsule    Sig: Take 1 capsule (100 mg total) by mouth 2 (two) times daily as needed for cough.    Dispense:  20 capsule    Refill:  0  . amoxicillin-clavulanate (AUGMENTIN) 875-125 MG tablet    Sig: Take 1 tablet by mouth 2 (two) times daily.    Dispense:  14 tablet    Refill:  0     I reviewed the patients updated PMH, FH, and SocHx.    Patient Active Problem List   Diagnosis Date Noted  . Type 2 diabetes mellitus without complication, without long-term current use of insulin ([place]) 11/13/2011    Priority: High  . Essential (primary) hypertension 02/03/2011    Priority: High  . Adrenal adenoma     Priority: Medium  . Osteopenia 04/04/2011    Priority:  Medium  . Gastro-esophageal reflux disease without esophagitis 02/03/2011    Priority: Medium  . Colon polyps 02/03/2011    Priority: Medium  . Migraine without status migrainosus, not intractable 02/03/2011    Priority: Medium  . Primary osteoarthritis of right hand 10/12/2016    Priority: Low  . Persistent cough 03/19/2015    Priority: Low  . Chronic allergic rhinitis 02/03/2011    Priority: Low   Current Meds  Medication Sig  . Ascorbic Acid (VITAMIN C) 1000 MG tablet Take 1,000 mg by mouth daily.    Marland Kitchen aspirin 81 MG  tablet Take 81 mg by mouth daily.    . cetirizine (ZYRTEC) 10 MG tablet Take 1 tablet by mouth daily.  . Cholecalciferol (VITAMIN D3) 1000 units CAPS Take 1,000 Units by mouth daily.  . Cinnamon 500 MG TABS Take 1 tablet by mouth daily.   . COLLAGEN PO Take 1 packet by mouth daily. power  . CRANBERRY PO Take 1 tablet by mouth daily.  Marland Kitchen docusate sodium (COLACE) 100 MG capsule Take 200 mg by mouth daily.  . fish oil-omega-3 fatty acids 1000 MG capsule Take 1 g by mouth daily.   . Multiple Vitamin (MULTI-VITAMINS) TABS Take 1 tablet by mouth daily.  . PSYLLIUM PO Take 1 packet by mouth 3 (three) times daily.  . SUMAtriptan (IMITREX) 100 MG tablet Take 0.5 tablets (50 mg total) by mouth every 2 (two) hours as needed.  Marland Kitchen telmisartan-hydrochlorothiazide (MICARDIS HCT) 80-25 MG tablet Take 1 tablet by mouth daily.  . vitamin B-12 (CYANOCOBALAMIN) 1000 MCG tablet Take 1,000 mcg by mouth daily.      Allergies: Patient has No Known Allergies. Family History: Patient family history includes Diabetes in her mother; Hypertension in her mother; Stroke in her mother. Social History:  Patient  reports that she has quit smoking. Her smoking use included cigarettes. She has never used smokeless tobacco. She reports that she does not drink alcohol and does not use drugs.  Review of Systems: Constitutional: Negative for fever malaise or anorexia Cardiovascular: negative for chest pain Respiratory: negative for SOB or persistent cough Gastrointestinal: negative for abdominal pain

## 2020-04-16 ENCOUNTER — Other Ambulatory Visit: Payer: Self-pay | Admitting: Family Medicine

## 2020-04-16 DIAGNOSIS — Z1231 Encounter for screening mammogram for malignant neoplasm of breast: Secondary | ICD-10-CM

## 2020-04-27 ENCOUNTER — Other Ambulatory Visit: Payer: Self-pay

## 2020-04-27 ENCOUNTER — Ambulatory Visit (INDEPENDENT_AMBULATORY_CARE_PROVIDER_SITE_OTHER): Payer: Medicare PPO | Admitting: Family Medicine

## 2020-04-27 ENCOUNTER — Encounter: Payer: Self-pay | Admitting: Family Medicine

## 2020-04-27 VITALS — BP 144/82 | HR 100 | Temp 98.3°F | Resp 16 | Ht 64.0 in | Wt 181.6 lb

## 2020-04-27 DIAGNOSIS — Z23 Encounter for immunization: Secondary | ICD-10-CM

## 2020-04-27 DIAGNOSIS — E119 Type 2 diabetes mellitus without complications: Secondary | ICD-10-CM | POA: Diagnosis not present

## 2020-04-27 DIAGNOSIS — K219 Gastro-esophageal reflux disease without esophagitis: Secondary | ICD-10-CM | POA: Diagnosis not present

## 2020-04-27 DIAGNOSIS — I10 Essential (primary) hypertension: Secondary | ICD-10-CM

## 2020-04-27 DIAGNOSIS — M8580 Other specified disorders of bone density and structure, unspecified site: Secondary | ICD-10-CM

## 2020-04-27 DIAGNOSIS — J0100 Acute maxillary sinusitis, unspecified: Secondary | ICD-10-CM

## 2020-04-27 LAB — POCT GLYCOSYLATED HEMOGLOBIN (HGB A1C): Hemoglobin A1C: 6.2 % — AB (ref 4.0–5.6)

## 2020-04-27 MED ORDER — BENZONATATE 200 MG PO CAPS
200.0000 mg | ORAL_CAPSULE | Freq: Two times a day (BID) | ORAL | 0 refills | Status: DC | PRN
Start: 2020-04-27 — End: 2020-05-17

## 2020-04-27 MED ORDER — AZITHROMYCIN 250 MG PO TABS: ORAL_TABLET | ORAL | 0 refills | Status: DC

## 2020-04-27 NOTE — Patient Instructions (Addendum)
Please return in 3 months for diabetes follow up and blood pressure check.  Call me if your sinus infection does not resolve. I've ordered a zpak and tessalon perles 200mg  twice a day as needed for cough.   If you have any questions or concerns, please don't hesitate to send me a message via MyChart or call the office at [phone]. Thank you for visiting with [place] today! It's our pleasure caring for you.    Marland Kitchen.It takes about 2 weeks for protection to develop after vaccination.  There are many flu viruses, and they are always changing. Each year a new flu vaccine is made to protect against three or four viruses that are likely to cause disease in the upcoming flu season. Even when the vaccine doesn't exactly match these viruses, it may still provide some protection.   Influenza vaccine does not cause flu.  Influenza vaccine may be given at the same time as other vaccines.  3. Talk with your health care provider  Tell your vaccine provider if the person getting the vaccine: ; Has had an allergic reaction after a previous dose of influenza vaccine, or has any severe, life-threatening allergies.  ; Has ever had Guillain-Barr Syndrome (also called GBS).  In some cases, your health care provider may decide to postpone influenza vaccination to a future visit.  People with minor illnesses, such as a cold, may be vaccinated. People who are moderately or severely ill should usually wait until they recover before getting influenza vaccine.  Your health care provider can give you more information.  4. Risks of a reaction  ; Soreness, redness, and swelling where shot is given, fever, muscle aches, and headache can happen after influenza vaccine. ; There may be a very small increased risk of Guillain-Barr Syndrome (GBS) after inactivated influenza vaccine (the flu shot).  Young children who get the flu shot along with pneumococcal vaccine (PCV13), and/or DTaP vaccine at the same time might be  slightly more likely to have a seizure caused by fever. Tell your health care provider if a child who is getting flu vaccine has ever had a seizure.  People sometimes faint after medical procedures, including vaccination. Tell your provider if you feel dizzy or have vision changes or ringing in the ears.  As with any medicine, there is a very remote chance of a vaccine causing a severe allergic reaction, other serious injury, or death.  5. What if there is a serious problem?  An allergic reaction could occur after the vaccinated person leaves the clinic. If you see signs of a severe allergic reaction (hives, swelling of the face and throat, difficulty breathing, a fast heartbeat, dizziness, or weakness), call 9-1-1 and get the person to the nearest hospital.  For other signs that concern you, call your health care provider.  Adverse reactions should be reported to the Vaccine Adverse Event Reporting System (VAERS). Your health care provider will usually file this report, or you can do it yourself. Visit the VAERS website at www.vaers.SamedayNews.es or call 3392689532.  VAERS is only for reporting reactions, and VAERS staff do not give medical advice.  6. The National Vaccine Injury Compensation Program  The [employer] Vaccine Injury Compensation Program (VICP) is a federal program that was created to compensate people who may have been injured by certain vaccines. Visit the VICP website at GoldCloset.com.ee or call 978-382-2930 to learn about the program and about filing a claim. There is a time limit to file a claim for compensation.  7. How can I learn more?  ; Ask your health care provider.  ; Call your local or state health department. ; Contact the Centers for Disease Control and Prevention (CDC): - Call (858)647-5160 (1-800-CDC-INFO) or - Visit CDC's influenza website at https://gibson.com/  Vaccine Information Statement (Interim) Inactivated Influenza Vaccine   04/04/2018 42 U.S.C.  [phone]   Department of Health and Geneticist, molecular for Disease Control and Prevention  Office Use Only

## 2020-04-27 NOTE — Progress Notes (Signed)
Subjective  CC:  Chief Complaint  Patient presents with  . Diabetes    had not been checking at home. Working on diet   . Hypertension    taking meds. denies any chest pain SOB or swelling in legs   . Sinusitis    pressure with sore throat and cough x 6 weeks.     HPI: Sabrina Aguirre is a 79 y.o. female who presents to the office today for follow up of diabetes and problems listed above in the chief complaint.   Diabetes follow up: 3 months ago we stopped her short acting single day dosing of Metformin 500 mg daily.  She reports that she feels fine without symptoms of hyperglycemia. Her diabetic control is reported as Unchanged.  She denies exertional CP or SOB or symptomatic hypoglycemia. She denies foot sores or paresthesias.   Hypertension: Blood pressure has been elevated and her medications were recently adjusted by cardiology.  Blood pressure mildly elevated today but she does not feel well.  See next  Sinusitis: Treated with Augmentin and is definitely slightly improved but still with pressure symptoms in her sinuses, thick drainage, hoarseness.  Cough is improved.  No fevers.  Has had complicated sinusitis in the past requiring prolonged antibiotic use.  No wheezing or shortness of breath.  No fevers.  GERD: Well controlled no longer needing PPI.  Osteopenia: Reviewed most recent bone density scan together.  Lowest T equals -1.7. Wt Readings from Last 3 Encounters:  04/27/20 181 lb 9.6 oz (82.4 kg)  03/31/20 182 lb 12.8 oz (82.9 kg)  01/23/20 179 lb 12.8 oz (81.6 kg)    BP Readings from Last 3 Encounters:  04/27/20 (!) 144/82  03/31/20 (!) 155/88  01/23/20 140/84    Assessment  1. Type 2 diabetes mellitus without complication, without long-term current use of insulin ([place])   2. Essential (primary) hypertension   3. Osteopenia, unspecified location   4. Gastro-esophageal reflux disease without esophagitis   5. Subacute maxillary sinusitis   6. Need for  immunization against influenza      Plan   Diabetes is currently very well controlled.  Diet controlled.  Continue to limit sweets including ice cream.  Will monitor closely.  Update flu shot today  Hypertension with marginal control but also not feeling well.  Improved from last visit since adjusting medications.  We will treat infection and recheck in 3 months.  No further adjustments made today.  Subacute sinusitis: Improved but not resolved.  Z-Pak and Tessalon Perles.  Recheck in 2 weeks if not resolved.  Osteopenia: Recommend calcium and vitamin D.  Weightbearing exercise discussed.  GERD: Rare.  No longer on chronic PPI.  Adjusted medication list.  Follow up: 3 months to recheck blood pressure and diabetes. Orders Placed This Encounter  Procedures  . Flu Vaccine QUAD High Dose(Fluad)  . POCT glycosylated hemoglobin (Hb A1C)   Meds ordered this encounter  Medications  . azithromycin (ZITHROMAX) 250 MG tablet    Sig: Take 2 tabs today, then 1 tab daily for 4 days    Dispense:  1 each    Refill:  0  . benzonatate (TESSALON) 200 MG capsule    Sig: Take 1 capsule (200 mg total) by mouth 2 (two) times daily as needed for cough.    Dispense:  20 capsule    Refill:  0      Immunization History  Administered Date(s) Administered  . Fluad Quad(high Dose 65+) 04/27/2020  .  Influenza Split 05/06/2012  . Influenza Whole 11/02/2008, 05/10/2013  . Influenza, High Dose Seasonal PF 05/12/2014, 05/26/2015, 05/22/2016, 04/25/2019  . PFIZER SARS-COV-2 Vaccination 09/11/2019, 10/02/2019  . Pneumococcal Conjugate-13 05/10/2015  . Pneumococcal Polysaccharide-23 05/19/2013, 04/25/2019  . Tdap 11/13/2011  . Zoster 05/07/2012  . Zoster Recombinat (Shingrix) 01/28/2018    Diabetes Related Lab Review: Lab Results  Component Value Date   HGBA1C 6.2 (A) 04/27/2020   HGBA1C 6.0 (A) 01/23/2020   HGBA1C 6.2 (H) 07/22/2017    No results found for: Derl Barrow Lab Results   Component Value Date   CREATININE 0.74 01/23/2020   BUN 12 01/23/2020   NA 135 01/23/2020   K 4.1 01/23/2020   CL 98 01/23/2020   CO2 30 01/23/2020   Lab Results  Component Value Date   CHOL 94 01/23/2020   CHOL 98 07/22/2017   CHOL 114 11/02/2014   Lab Results  Component Value Date   HDL 48.50 01/23/2020   HDL 44 07/22/2017   HDL 56 11/02/2014   Lab Results  Component Value Date   LDLCALC 23 01/23/2020   LDLCALC 28 07/22/2017   LDLCALC 28 11/02/2014   Lab Results  Component Value Date   TRIG 113.0 01/23/2020   TRIG 128 07/22/2017   TRIG 150 (H) 11/02/2014   Lab Results  Component Value Date   CHOLHDL 2 01/23/2020   CHOLHDL 2.2 07/22/2017   CHOLHDL 2.0 11/02/2014   No results found for: LDLDIRECT The ASCVD Risk score Mikey Bussing DC Jr., et al., 2013) failed to calculate for the following reasons:   The 2013 ASCVD risk score is only valid for ages 73 to 44 I have reviewed the [place], Fam and Soc history. Patient Active Problem List   Diagnosis Date Noted  . Type 2 diabetes mellitus without complication, without long-term current use of insulin ([place]) 11/13/2011    Priority: High  . Essential (primary) hypertension 02/03/2011    Priority: High  . Adrenal adenoma     Priority: Medium  . Osteopenia 04/04/2011    Priority: Medium    Dexa 03/2020 lowest T = - 1.7; recheck 2 years.    . Gastro-esophageal reflux disease without esophagitis 02/03/2011    Priority: Medium  . Colon polyps 02/03/2011    Priority: Medium  . Migraine without status migrainosus, not intractable 02/03/2011    Priority: Medium  . Primary osteoarthritis of right hand 10/12/2016    Priority: Low  . Persistent cough 03/19/2015    Priority: Low  . Chronic allergic rhinitis 02/03/2011    Priority: Low    Social History: Patient  reports that she has quit smoking. Her smoking use included cigarettes. She has never used smokeless tobacco. She reports that she does not drink alcohol and does not  use drugs.  Review of Systems: Ophthalmic: negative for eye pain, loss of vision or double vision Cardiovascular: negative for chest pain Respiratory: negative for SOB or persistent cough Gastrointestinal: negative for abdominal pain Genitourinary: negative for dysuria or gross hematuria MSK: negative for foot lesions Neurologic: negative for weakness or gait disturbance  Objective  Vitals: BP (!) 144/82   Pulse 100   Temp 98.3 F (36.8 C) (Temporal)   Resp 16   Ht 5\' 4"  (1.626 m)   Wt 181 lb 9.6 oz (82.4 kg)   SpO2 96%   BMI 31.17 kg/m  General: Nontoxic appearing, but appears under the weather, no acute distress, hoarse voice Psych:  Alert and oriented, normal mood and affect HEENT:  Normocephalic, atraumatic, moist mucous membranes, supple neck, right greater than left sinus tenderness present Cardiovascular:  Nl S1 and S2, RRR without murmur, gallop or rub. no edema Respiratory:  Good breath sounds bilaterally, CTAB with normal effort, no rales   Diabetic education: ongoing education regarding chronic disease management for diabetes was given today. We continue to reinforce the ABC's of diabetic management: A1c (<7 or 8 dependent upon patient), tight blood pressure control, and cholesterol management with goal LDL < 100 minimally. We discuss diet strategies, exercise recommendations, medication options and possible side effects. At each visit, we review recommended immunizations and preventive care recommendations for diabetics and stress that good diabetic control can prevent other problems. See below for this patient's data.    Commons side effects, risks, benefits, and alternatives for medications and treatment plan prescribed today were discussed, and the patient expressed understanding of the given instructions. Patient is instructed to call or message via MyChart if he/she has any questions or concerns regarding our treatment plan. No barriers to understanding were  identified. We discussed Red Flag symptoms and signs in detail. Patient expressed understanding regarding what to do in case of urgent or emergency type symptoms.   Medication list was reconciled, printed and provided to the patient in AVS. Patient instructions and summary information was reviewed with the patient as documented in the AVS. This note was prepared with assistance of Dragon voice recognition software. Occasional wrong-word or sound-a-like substitutions may have occurred due to the inherent limitations of voice recognition software  This visit occurred during the SARS-CoV-2 public health emergency.  Safety protocols were in place, including screening questions prior to the visit, additional usage of staff PPE, and extensive cleaning of exam room while observing appropriate contact time as indicated for disinfecting solutions.

## 2020-04-28 LAB — HM DIABETES EYE EXAM

## 2020-04-30 ENCOUNTER — Encounter: Payer: Self-pay | Admitting: Family Medicine

## 2020-05-03 ENCOUNTER — Encounter: Payer: Self-pay | Admitting: Family Medicine

## 2020-05-13 ENCOUNTER — Ambulatory Visit
Admission: RE | Admit: 2020-05-13 | Discharge: 2020-05-13 | Disposition: A | Discharge status: Home / Self Care | Payer: Medicare PPO | Source: Ambulatory Visit | Attending: Family Medicine | Admitting: Family Medicine

## 2020-05-13 ENCOUNTER — Other Ambulatory Visit: Payer: Self-pay

## 2020-05-13 DIAGNOSIS — Z1231 Encounter for screening mammogram for malignant neoplasm of breast: Secondary | ICD-10-CM

## 2020-05-17 ENCOUNTER — Ambulatory Visit (INDEPENDENT_AMBULATORY_CARE_PROVIDER_SITE_OTHER): Payer: Medicare PPO

## 2020-05-17 ENCOUNTER — Telehealth: Payer: Self-pay | Admitting: Family Medicine

## 2020-05-17 DIAGNOSIS — Z0000 Encounter for general adult medical examination without abnormal findings: Secondary | ICD-10-CM

## 2020-05-17 MED ORDER — BENZONATATE 200 MG PO CAPS: 200.0000 mg | ORAL_CAPSULE | Freq: Two times a day (BID) | ORAL | 0 refills | Status: DC | PRN

## 2020-05-17 NOTE — Telephone Encounter (Signed)
-----   Message from Willette Brace, LPN sent at 9/58/4417  2:09 PM EDT ----- Regarding: refill Pt is requesting a refill on tessalon pearls, related to, she  is still coughing at times and this helped her.

## 2020-05-17 NOTE — Patient Instructions (Signed)
[name] , Thank you for taking time to come for your Medicare Wellness Visit. I appreciate your ongoing commitment to your health goals. Please review the following plan we discussed and let me know if I can assist you in the future.   Screening recommendations/referrals: Colonoscopy: no longer required Mammogram: No longer required Bone Density: Done 04/07/20 Recommended yearly ophthalmology/optometry visit for glaucoma screening and checkup Recommended yearly dental visit for hygiene and checkup  Vaccinations: Influenza vaccine: Up to date Pneumococcal vaccine: Up to date Tdap vaccine: Up to date Shingles vaccine: Completed 01/28/18   Covid-19:Completed 1/21 & 10/02/19  Advanced directives: Please bring a copy of your health care power of attorney and living will to the office at your convenience.   Conditions/risks identified: stay healthy and take care of my dog  Next appointment: Follow up in one year for your annual wellness visit   Preventive Care 65 Years and Older, Female Preventive care refers to lifestyle choices and visits with your health care provider that can promote health and wellness. What does preventive care include?  A yearly physical exam. This is also called an annual well check.  Dental exams once or twice a year.  Routine eye exams. Ask your health care provider how often you should have your eyes checked.  Personal lifestyle choices, including:  Daily care of your teeth and gums.  Regular physical activity.  Eating a healthy diet.  Avoiding tobacco and drug use.  Limiting alcohol use.  Practicing safe sex.  Taking low-dose aspirin every day.  Taking vitamin and mineral supplements as recommended by your health care provider. What happens during an annual well check? The services and screenings done by your health care provider during your annual well check will depend on your age, overall health, lifestyle risk factors, and family history  of disease. Counseling  Your health care provider may ask you questions about your:  Alcohol use.  Tobacco use.  Drug use.  Emotional well-being.  Home and relationship well-being.  Sexual activity.  Eating habits.  History of falls.  Memory and ability to understand (cognition).  Work and work Statistician.  Reproductive health. Screening  You may have the following tests or measurements:  Height, weight, and BMI.  Blood pressure.  Lipid and cholesterol levels. These may be checked every 5 years, or more frequently if you are over 51 years old.  Skin check.  Lung cancer screening. You may have this screening every year starting at age 32 if you have a 30-pack-year history of smoking and currently smoke or have quit within the past 15 years.  Fecal occult blood test (FOBT) of the stool. You may have this test every year starting at age 39.  Flexible sigmoidoscopy or colonoscopy. You may have a sigmoidoscopy every 5 years or a colonoscopy every 10 years starting at age 55.  Hepatitis C blood test.  Hepatitis B blood test.  Sexually transmitted disease (STD) testing.  Diabetes screening. This is done by checking your blood sugar (glucose) after you have not eaten for a while (fasting). You may have this done every 1-3 years.  Bone density scan. This is done to screen for osteoporosis. You may have this done starting at age 1.  Mammogram. This may be done every 1-2 years. Talk to your health care provider about how often you should have regular mammograms. Talk with your health care provider about your test results, treatment options, and if necessary, the need for more tests. Vaccines  Your  health care provider may recommend certain vaccines, such as:  Influenza vaccine. This is recommended every year.  Tetanus, diphtheria, and acellular pertussis (Tdap, Td) vaccine. You may need a Td booster every 10 years.  Zoster vaccine. You may need this after age  74.  Pneumococcal 13-valent conjugate (PCV13) vaccine. One dose is recommended after age 48.  Pneumococcal polysaccharide (PPSV23) vaccine. One dose is recommended after age 50. Talk to your health care provider about which screenings and vaccines you need and how often you need them. This information is not intended to replace advice given to you by your health care provider. Make sure you discuss any questions you have with your health care provider. Document Released: 09/03/2015 Document Revised: 04/26/2016 Document Reviewed: 06/08/2015 Elsevier Interactive Patient Education  2017 [place] Prevention in the Home Falls can cause injuries. They can happen to people of all ages. There are many things you can do to make your home safe and to help prevent falls. What can I do on the outside of my home?  Regularly fix the edges of walkways and driveways and fix any cracks.  Remove anything that might make you trip as you walk through a door, such as a raised step or threshold.  Trim any bushes or trees on the path to your home.  Use bright outdoor lighting.  Clear any walking paths of anything that might make someone trip, such as rocks or tools.  Regularly check to see if handrails are loose or broken. Make sure that both sides of any steps have handrails.  Any raised decks and porches should have guardrails on the edges.  Have any leaves, snow, or ice cleared regularly.  Use sand or salt on walking paths during winter.  Clean up any spills in your garage right away. This includes oil or grease spills. What can I do in the bathroom?  Use night lights.  Install grab bars by the toilet and in the tub and shower. Do not use towel bars as grab bars.  Use non-skid mats or decals in the tub or shower.  If you need to sit down in the shower, use a plastic, non-slip stool.  Keep the floor dry. Clean up any water that spills on the floor as soon as it happens.  Remove  soap buildup in the tub or shower regularly.  Attach bath mats securely with double-sided non-slip rug tape.  Do not have throw rugs and other things on the floor that can make you trip. What can I do in the bedroom?  Use night lights.  Make sure that you have a light by your bed that is easy to reach.  Do not use any sheets or blankets that are too big for your bed. They should not hang down onto the floor.  Have a firm chair that has side arms. You can use this for support while you get dressed.  Do not have throw rugs and other things on the floor that can make you trip. What can I do in the kitchen?  Clean up any spills right away.  Avoid walking on wet floors.  Keep items that you use a lot in easy-to-reach places.  If you need to reach something above you, use a strong step stool that has a grab bar.  Keep electrical cords out of the way.  Do not use floor polish or wax that makes floors slippery. If you must use wax, use non-skid floor wax.  Do not  have throw rugs and other things on the floor that can make you trip. What can I do with my stairs?  Do not leave any items on the stairs.  Make sure that there are handrails on both sides of the stairs and use them. Fix handrails that are broken or loose. Make sure that handrails are as long as the stairways.  Check any carpeting to make sure that it is firmly attached to the stairs. Fix any carpet that is loose or worn.  Avoid having throw rugs at the top or bottom of the stairs. If you do have throw rugs, attach them to the floor with carpet tape.  Make sure that you have a light switch at the top of the stairs and the bottom of the stairs. If you do not have them, ask someone to add them for you. What else can I do to help prevent falls?  Wear shoes that:  Do not have high heels.  Have rubber bottoms.  Are comfortable and fit you well.  Are closed at the toe. Do not wear sandals.  If you use a  stepladder:  Make sure that it is fully opened. Do not climb a closed stepladder.  Make sure that both sides of the stepladder are locked into place.  Ask someone to hold it for you, if possible.  Clearly mark and make sure that you can see:  Any grab bars or handrails.  First and last steps.  Where the edge of each step is.  Use tools that help you move around (mobility aids) if they are needed. These include:  Canes.  Walkers.  Scooters.  Crutches.  Turn on the lights when you go into a dark area. Replace any light bulbs as soon as they burn out.  Set up your furniture so you have a clear path. Avoid moving your furniture around.  If any of your floors are uneven, fix them.  If there are any pets around you, be aware of where they are.  Review your medicines with your doctor. Some medicines can make you feel dizzy. This can increase your chance of falling. Ask your doctor what other things that you can do to help prevent falls. This information is not intended to replace advice given to you by your health care provider. Make sure you discuss any questions you have with your health care provider. Document Released: 06/03/2009 Document Revised: 01/13/2016 Document Reviewed: 09/11/2014 Elsevier Interactive Patient Education  2017 [employer].
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
## 2020-06-17 ENCOUNTER — Ambulatory Visit: Payer: Medicare PPO
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
## 2020-07-14 ENCOUNTER — Other Ambulatory Visit: Payer: Self-pay

## 2020-07-14 ENCOUNTER — Encounter: Payer: Self-pay | Admitting: General Practice

## 2020-07-14 ENCOUNTER — Ambulatory Visit: Payer: Medicare PPO | Admitting: General Practice

## 2020-07-14 VITALS — BP 142/88 | HR 94 | Ht 63.0 in | Wt 181.2 lb

## 2020-07-14 DIAGNOSIS — R002 Palpitations: Secondary | ICD-10-CM

## 2020-07-14 DIAGNOSIS — I10 Essential (primary) hypertension: Secondary | ICD-10-CM

## 2020-07-14 DIAGNOSIS — R0789 Other chest pain: Secondary | ICD-10-CM

## 2020-07-14 DIAGNOSIS — E119 Type 2 diabetes mellitus without complications: Secondary | ICD-10-CM | POA: Diagnosis not present

## 2020-07-14 NOTE — Progress Notes (Signed)
Running the C clinic today? Thank you

## 2020-07-14 NOTE — Patient Instructions (Signed)
Medication Instructions:  The current medical regimen is effective;  continue present plan and medications as directed. Please refer to the Current Medication list given to you today. *If you need a refill on your cardiac medications before your next appointment, please call your pharmacy*  Lab Work:   Testing/Procedures:  NONE    NONE  Special Instructions CALL WHEN YOU GET HOME AND TELL us YOUR BLOOD PRESSURES ON THE LOG YOU HAVE AT HOME  PLEASE READ AND FOLLOW SALTY 6-ATTACHED-1,800mg  daily  NO SALT AT ALL-NO KOSHER SALT, HIMALAYAN SALT USE  Follow-Up: Your next appointment:  3 month(s) In Person with Sanda Klein, MD OR IF UNAVAILABLE JESSE CLEAVER, FNP-C  At [facility], you and your health needs are our priority.  As part of our continuing mission to provide you with exceptional heart care, we have created designated Provider Care Teams.  These Care Teams include your primary Cardiologist (physician) and Advanced Practice Providers (APPs -  Physician Assistants and Nurse Practitioners) who all work together to provide you with the care you need, when you need it.  We recommend signing up for the patient portal called "MyChart".  Sign up information is provided on this After Visit Summary.  MyChart is used to connect with patients for Virtual Visits (Telemedicine).  Patients are able to view lab/test results, encounter notes, upcoming appointments, etc.  Non-urgent messages can be sent to your provider as well.   To learn more about what you can do with MyChart, go to [email or web address].              6 SALTY THINGS TO AVOID     1,800MG  DAILY

## 2020-08-04 ENCOUNTER — Ambulatory Visit: Payer: Medicare PPO | Admitting: Family Medicine

## 2020-08-16 ENCOUNTER — Other Ambulatory Visit: Payer: Self-pay

## 2020-08-16 MED ORDER — TELMISARTAN-HCTZ 80-25 MG PO TABS: 1.0000 | ORAL_TABLET | Freq: Every day | ORAL | 3 refills | Status: DC

## 2020-08-27 ENCOUNTER — Other Ambulatory Visit: Payer: Self-pay

## 2020-08-27 MED ORDER — SUMATRIPTAN SUCCINATE 100 MG PO TABS
50.0000 mg | ORAL_TABLET | ORAL | 1 refills | Status: DC | PRN
Start: 2020-08-27 — End: 2021-01-21

## 2020-09-08 ENCOUNTER — Ambulatory Visit: Payer: Medicare PPO | Admitting: Family Medicine

## 2020-09-14 ENCOUNTER — Ambulatory Visit: Payer: Medicare PPO | Admitting: Family Medicine
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
## 2020-10-06 ENCOUNTER — Other Ambulatory Visit: Payer: Self-pay

## 2020-10-06 ENCOUNTER — Ambulatory Visit: Payer: Medicare PPO | Admitting: General Practice

## 2020-10-06 ENCOUNTER — Ambulatory Visit (INDEPENDENT_AMBULATORY_CARE_PROVIDER_SITE_OTHER): Payer: Medicare PPO | Admitting: Family Medicine

## 2020-10-06 ENCOUNTER — Encounter: Payer: Self-pay | Admitting: Family Medicine

## 2020-10-06 ENCOUNTER — Encounter: Payer: Self-pay | Admitting: General Practice

## 2020-10-06 VITALS — BP 144/88 | HR 83 | Ht 64.0 in | Wt 183.6 lb

## 2020-10-06 VITALS — BP 142/88 | HR 88 | Temp 97.8°F | Resp 16 | Wt 182.8 lb

## 2020-10-06 DIAGNOSIS — K219 Gastro-esophageal reflux disease without esophagitis: Secondary | ICD-10-CM

## 2020-10-06 DIAGNOSIS — R0789 Other chest pain: Secondary | ICD-10-CM

## 2020-10-06 DIAGNOSIS — E119 Type 2 diabetes mellitus without complications: Secondary | ICD-10-CM | POA: Diagnosis not present

## 2020-10-06 DIAGNOSIS — G4700 Insomnia, unspecified: Secondary | ICD-10-CM | POA: Diagnosis not present

## 2020-10-06 DIAGNOSIS — G4723 Circadian rhythm sleep disorder, irregular sleep wake type: Secondary | ICD-10-CM

## 2020-10-06 DIAGNOSIS — I10 Essential (primary) hypertension: Secondary | ICD-10-CM

## 2020-10-06 DIAGNOSIS — K2100 Gastro-esophageal reflux disease with esophagitis, without bleeding: Secondary | ICD-10-CM | POA: Diagnosis not present

## 2020-10-06 DIAGNOSIS — R002 Palpitations: Secondary | ICD-10-CM | POA: Diagnosis not present

## 2020-10-06 LAB — POCT GLYCOSYLATED HEMOGLOBIN (HGB A1C): Hemoglobin A1C: 6.4 % — AB (ref 4.0–5.6)

## 2020-10-06 MED ORDER — OMEPRAZOLE 20 MG PO CPDR: 20.0000 mg | DELAYED_RELEASE_CAPSULE | Freq: Every day | ORAL | 2 refills | Status: DC

## 2020-10-06 MED ORDER — SHINGRIX 50 MCG/0.5ML IM SUSR: 0.5000 mL | Freq: Once | INTRAMUSCULAR | 0 refills | Status: AC

## 2020-10-06 NOTE — Patient Instructions (Signed)
Medication Instructions:  The current medical regimen is effective;  continue present plan and medications as directed. Please refer to the Current Medication list given to you today.  *If you need a refill on your cardiac medications before your next appointment, please call your pharmacy*  Lab Work:   Testing/Procedures:  NONE    NONE  Special Instructions CALL WITH METOPROLOL DOSING WHEN YOU GET HOME  PLEASE READ AND FOLLOW SLEEP HYGIENE-ATTACHED MAY TAKE MELATONIN 1MG  AS NEEDED FOR SLEEP  PLEASE READ AND FOLLOW GERD DIET-ATTACHED.Marland Kitchen MAY TAKE TUMS/MYLANTA AS NEEDED  TAKE AND LOG YOUR YOUR BLOOD PRESSURE ONCE A WEEK AND WHEN NEEDED  Follow-Up: Your next appointment:  6 month(s) In Person with Sanda Klein, MD OR IF UNAVAILABLE JESSE CLEAVER, FNP-C   Please call our office 2 months in advance to schedule this appointment   At [facility], you and your health needs are our priority.  As part of our continuing mission to provide you with exceptional heart care, we have created designated Provider Care Teams.  These Care Teams include your primary Cardiologist (physician) and Advanced Practice Providers (APPs -  Physician Assistants and Nurse Practitioners) who all work together to provide you with the care you need, when you need it.  We recommend signing up for the patient portal called "MyChart".  Sign up information is provided on this After Visit Summary.  MyChart is used to connect with patients for Virtual Visits (Telemedicine).  Patients are able to view lab/test results, encounter notes, upcoming appointments, etc.  Non-urgent messages can be sent to your provider as well.   To learn more about what you can do with MyChart, go to [email or web address].      Food Choices for Gastroesophageal Reflux Disease, Adult When you have gastroesophageal reflux disease (GERD), the foods you eat and your eating habits are very important. Choosing the right foods can help ease your  discomfort. Think about working with a food expert (dietitian) to help you make good choices. What are tips for following this plan? Reading food labels  Look for foods that are low in saturated fat. Foods that may help with your symptoms include: ? Foods that have less than 5% of daily value (DV) of fat. ? Foods that have 0 grams of trans fat. Cooking  Do not fry your food.  Cook your food by baking, steaming, grilling, or broiling. These are all methods that do not need a lot of fat for cooking.  To add flavor, try to use herbs that are low in spice and acidity. Meal planning  Choose healthy foods that are low in fat, such as: ? Fruits and vegetables. ? Whole grains. ? Low-fat dairy products. ? Lean meats, fish, and poultry.  Eat small meals often instead of eating 3 large meals each day. Eat your meals slowly in a place where you are relaxed. Avoid bending over or lying down until 2-3 hours after eating.  Limit high-fat foods such as fatty meats or fried foods.  Limit your intake of fatty foods, such as oils, butter, and shortening.  Avoid the following as told by your doctor: ? Foods that cause symptoms. These may be different for different people. Keep a food diary to keep track of foods that cause symptoms. ? Alcohol. ? Drinking a lot of liquid with meals. ? Eating meals during the 2-3 hours before bed.   Lifestyle  Stay at a healthy weight. Ask your doctor what weight is healthy for you. If you need  to lose weight, work with your doctor to do so safely.  Exercise for at least 30 minutes on 5 or more days each week, or as told by your doctor.  Wear loose-fitting clothes.  Do not smoke or use any products that contain nicotine or tobacco. If you need help quitting, ask your doctor.  Sleep with the head of your bed higher than your feet. Use a wedge under the mattress or blocks under the bed frame to raise the head of the bed.  Chew sugar-free gum after meals. What  foods should eat? Eat a healthy, well-balanced diet of fruits, vegetables, whole grains, low-fat dairy products, lean meats, fish, and poultry. Each person is different. Foods that may cause symptoms in one person may not cause any symptoms in another person. Work with your doctor to find foods that are safe for you. The items listed above may not be a complete list of what you can eat and drink. Contact a food expert for more options.   What foods should I avoid? Limiting some of these foods may help in managing the symptoms of GERD. Everyone is different. Talk with a food expert or your doctor to help you find the exact foods to avoid, if any. Fruits Any fruits prepared with added fat. Any fruits that cause symptoms. For some people, this may include citrus fruits, such as oranges, grapefruit, pineapple, and lemons. Vegetables Deep-fried vegetables. [place] fries. Any vegetables prepared with added fat. Any vegetables that cause symptoms. For some people, this may include tomatoes and tomato products, chili peppers, onions and garlic, and horseradish. Grains Pastries or quick breads with added fat. Meats and other proteins High-fat meats, such as fatty beef or pork, hot dogs, ribs, ham, sausage, salami, and bacon. Fried meat or protein, including fried fish and fried chicken. Nuts and nut butters, in large amounts. Dairy Whole milk and chocolate milk. Sour cream. Cream. Ice cream. Cream cheese. Milkshakes. Fats and oils Butter. Margarine. Shortening. Ghee. Beverages Coffee and tea, with or without caffeine. Carbonated beverages. Sodas. Energy drinks. Fruit juice made with acidic fruits, such as orange or grapefruit. Tomato juice. Alcoholic drinks. Sweets and desserts Chocolate and cocoa. Donuts. Seasonings and condiments Pepper. Peppermint and spearmint. Added salt. Any condiments, herbs, or seasonings that cause symptoms. For some people, this may include curry, hot sauce, or vinegar-based  salad dressings. The items listed above may not be a complete list of what you should not eat and drink. Contact a food expert for more options. Questions to ask your doctor Diet and lifestyle changes are often the first steps that are taken to manage symptoms of GERD. If diet and lifestyle changes do not help, talk with your doctor about taking medicines. Where to find more information  International Foundation for Gastrointestinal Disorders: aboutgerd.org Summary  When you have GERD, food and lifestyle choices are very important in easing your symptoms.  Eat small meals often instead of 3 large meals a day. Eat your meals slowly and in a place where you are relaxed.  Avoid bending over or lying down until 2-3 hours after eating.  Limit high-fat foods such as fatty meats or fried foods. This information is not intended to replace advice given to you by your health care provider. Make sure you discuss any questions you have with your health care provider. Document Revised: 02/16/2020 Document Reviewed: 02/16/2020 Elsevier Patient Education  2021 [place] Sleep Information, Adult Quality sleep is important for your mental and  physical health. It also improves your quality of life. Quality sleep means you:  Are asleep for most of the time you are in bed.  Fall asleep within 30 minutes.  Wake up no more than once a night.  Are awake for no longer than 20 minutes if you do wake up during the night. Most adults need 7-8 hours of quality sleep each night. How can poor sleep affect me? If you do not get enough quality sleep, you may have:  Mood swings.  Daytime sleepiness.  Confusion.  Decreased reaction time.  Sleep disorders, such as insomnia and sleep apnea.  Difficulty with: ? Solving problems. ? Coping with stress. ? Paying attention. These issues may affect your performance and productivity at work, school, and at home. Lack of sleep may also put you  at higher risk for accidents, suicide, and risky behaviors. If you do not get quality sleep you may also be at higher risk for several health problems, including:  Infections.  Type 2 diabetes.  Heart disease.  High blood pressure.  Obesity.  Worsening of long-term conditions, like arthritis, kidney disease, depression, Parkinson's disease, and epilepsy. What actions can I take to get more quality sleep?  Stick to a sleep schedule. Go to sleep and wake up at about the same time each day. Do not try to sleep less on weekdays and make up for lost sleep on weekends. This does not work.  Try to get about 30 minutes of exercise on most days. Do not exercise 2-3 hours before going to bed.  Limit naps during the day to 30 minutes or less.  Do not use any products that contain nicotine or tobacco, such as cigarettes or e-cigarettes. If you need help quitting, ask your health care provider.  Do not drink caffeinated beverages for at least 8 hours before going to bed. Coffee, tea, and some sodas contain caffeine.  Do not drink alcohol close to bedtime.  Do not eat large meals close to bedtime.  Do not take naps in the late afternoon.  Try to get at least 30 minutes of sunlight every day. Morning sunlight is best.  Make time to relax before bed. Reading, listening to music, or taking a hot bath promotes quality sleep.  Make your bedroom a place that promotes quality sleep. Keep your bedroom dark, quiet, and at a comfortable room temperature. Make sure your bed is comfortable. Take out sleep distractions like TV, a computer, smartphone, and bright lights.  If you are lying awake in bed for longer than 20 minutes, get up and do a relaxing activity until you feel sleepy.  Work with your health care provider to treat medical conditions that may affect sleeping, such as: ? Nasal obstruction. ? Snoring. ? Sleep apnea and other sleep disorders.  Talk to your health care provider if you  think any of your prescription medicines may cause you to have difficulty falling or staying asleep.  If you have sleep problems, talk with a sleep consultant. If you think you have a sleep disorder, talk with your health care provider about getting evaluated by a specialist.      Where to find more information  [place] website: https://sleepfoundation.org  National Heart, Lung, and [place]): http://www.saunders.info/.pdf  Centers for Disease Control and Prevention (CDC): LearningDermatology.pl Contact a health care provider if you:  Have trouble getting to sleep or staying asleep.  Often wake up very early in the morning and cannot get back to  sleep.  Have daytime sleepiness.  Have daytime sleep attacks of suddenly falling asleep and sudden muscle weakness (narcolepsy).  Have a tingling sensation in your legs with a strong urge to move your legs (restless legs syndrome).  Stop breathing briefly during sleep (sleep apnea).  Think you have a sleep disorder or are taking a medicine that is affecting your quality of sleep. Summary  Most adults need 7-8 hours of quality sleep each night.  Getting enough quality sleep is an important part of health and well-being.  Make your bedroom a place that promotes quality sleep and avoid things that may cause you to have poor sleep, such as alcohol, caffeine, smoking, and large meals.  Talk to your health care provider if you have trouble falling asleep or staying asleep. This information is not intended to replace advice given to you by your health care provider. Make sure you discuss any questions you have with your health care provider. Document Revised: 11/14/2017 Document Reviewed: 11/14/2017 Elsevier Patient Education  2021 [employer].

## 2020-10-06 NOTE — Progress Notes (Signed)
Subjective  CC:  Chief Complaint  Patient presents with  . Diabetes  . Hypertension  . Insomnia    Trouble falling and staying asleep, getting around 2-3 hours per night     HPI: Sabrina Aguirre is a 80 y.o. female who presents to the office today for follow up of diabetes and problems listed above in the chief complaint.   Diabetes follow up: Her diabetic control is reported as Unknown.  She does not check her sugars at home.  She denies symptoms of hypoglycemia.  Diet is fair.  She does like ice cream but only needs it intermittently now.  No longer on Metformin. She denies exertional CP or SOB or symptomatic hypoglycemia. She denies foot sores or paresthesias.   Reviewed cardiology office visit notes from today.  Patient is being seen for palpitations and hypertension follow-up.  Fortunately she has not had palpitations.  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  Her blood pressure remains borderline but she has a headaches today.  At home her readings are normal.  Complains of midepigastric GERD symptoms.  Worsening over the last month.  Having symptoms almost daily.  Using Tums.  Carries a diagnosis of GERD.  Has been well controlled.  She has been using a little bit more ibuprofen lately which could be exacerbating her condition.  She was treated with Prilosec years ago.  No hematemesis or melena.  Complains of insomnia.  Never has been a good sleeper.  Typically goes to bed at 7 PM to watch TV.  TV remains on the remainder of the night.  Usually will fall asleep between midnight and 2 AM.  She awakens around 7.  She drinks 1 or 2 cups of coffee per day.  She says her sleep is not changed although she feels less rested when she awakens.  Denies snoring.  Would prefer not to use medications.   Wt Readings from Last 3 Encounters:  10/06/20 182 lb 12.8 oz (82.9 kg)  10/06/20 183 lb 9.6 oz (83.3 kg)  07/14/20 181 lb 3.2 oz (82.2 kg)    BP  Readings from Last 3 Encounters:  10/06/20 (!) 142/88  10/06/20 (!) 144/88  07/14/20 (!) 142/88    Assessment  1. Essential (primary) hypertension   2. Type 2 diabetes mellitus without complication, without long-term current use of insulin ([place])   3. Gastro-esophageal reflux disease without esophagitis   4. Insomnia, unspecified type      Plan   Diabetes is currently well controlled.  She is diet controlled.  Continue diet.  Recheck every 3 months.  Hypertension: Fairly good control.  Continue current medications: Metoprolol XL 25 daily and Micardis HCT 80/12.5 daily.  Palpitations are well controlled.  No ischemia by cardiology review.  GERD: Now active again.  Avoid NSAIDs.  Start Prilosec for the next 4 to 12 weeks.  Follow-up if not proving.  Insomnia: Chronic.  Trial of melatonin.  Discussed sleep hygiene.  She is unlikely to change her behaviors at this time.  No red flag symptoms identified.  Follow up: No follow-ups on file.. Orders Placed This Encounter  Procedures  . POCT HgB A1C   No orders of the defined types were placed in this encounter.     Immunization History  Administered Date(s) Administered  . Fluad Quad(high Dose 65+) 04/27/2020  . Influenza Split 05/06/2012  . Influenza Whole 11/02/2008, 05/10/2013  . Influenza, High Dose Seasonal PF  05/12/2014, 05/26/2015, 05/22/2016, 04/25/2019  . PFIZER(Purple Top)SARS-COV-2 Vaccination 09/11/2019, 10/02/2019, 05/17/2020  . Pneumococcal Conjugate-13 05/10/2015  . Pneumococcal Polysaccharide-23 05/19/2013, 04/25/2019  . Tdap 11/13/2011  . Zoster 05/07/2012  . Zoster Recombinat (Shingrix) 01/28/2018    Diabetes Related Lab Review: Lab Results  Component Value Date   HGBA1C 6.4 (A) 10/06/2020   HGBA1C 6.2 (A) 04/27/2020   HGBA1C 6.0 (A) 01/23/2020    No results found for: Derl Barrow Lab Results  Component Value Date   CREATININE 0.74 01/23/2020   BUN 12 01/23/2020   NA 135 01/23/2020   K 4.1  01/23/2020   CL 98 01/23/2020   CO2 30 01/23/2020   Lab Results  Component Value Date   CHOL 94 01/23/2020   CHOL 98 07/22/2017   CHOL 114 11/02/2014   Lab Results  Component Value Date   HDL 48.50 01/23/2020   HDL 44 07/22/2017   HDL 56 11/02/2014   Lab Results  Component Value Date   LDLCALC 23 01/23/2020   LDLCALC 28 07/22/2017   LDLCALC 28 11/02/2014   Lab Results  Component Value Date   TRIG 113.0 01/23/2020   TRIG 128 07/22/2017   TRIG 150 (H) 11/02/2014   Lab Results  Component Value Date   CHOLHDL 2 01/23/2020   CHOLHDL 2.2 07/22/2017   CHOLHDL 2.0 11/02/2014   No results found for: LDLDIRECT The ASCVD Risk score Mikey Bussing DC Jr., et al., 2013) failed to calculate for the following reasons:   The 2013 ASCVD risk score is only valid for ages 20 to 47 I have reviewed the [place], Fam and Soc history. Patient Active Problem List   Diagnosis Date Noted  . Type 2 diabetes mellitus without complication, without long-term current use of insulin ([place]) 11/13/2011    Priority: High  . Essential (primary) hypertension 02/03/2011    Priority: High  . Adrenal adenoma     Priority: Medium  . Osteopenia 04/04/2011    Priority: Medium    Dexa 03/2020 lowest T = - 1.7; recheck 2 years.    . Gastro-esophageal reflux disease without esophagitis 02/03/2011    Priority: Medium  . Colon polyps 02/03/2011    Priority: Medium  . Migraine without status migrainosus, not intractable 02/03/2011    Priority: Medium  . Primary osteoarthritis of right hand 10/12/2016    Priority: Low  . Persistent cough 03/19/2015    Priority: Low  . Chronic allergic rhinitis 02/03/2011    Priority: Low    Social History: Patient  reports that she has quit smoking. Her smoking use included cigarettes. She has never used smokeless tobacco. She reports that she does not drink alcohol and does not use drugs.  Review of Systems: Ophthalmic: negative for eye pain, loss of vision or double  vision Cardiovascular: negative for chest pain Respiratory: negative for SOB or persistent cough Gastrointestinal: negative for abdominal pain Genitourinary: negative for dysuria or gross hematuria MSK: negative for foot lesions Neurologic: negative for weakness or gait disturbance  Objective  Vitals: BP (!) 142/88   Pulse 88   Temp 97.8 F (36.6 C) (Temporal)   Resp 16   Wt 182 lb 12.8 oz (82.9 kg)   SpO2 97%   BMI 31.38 kg/m  General: well appearing, no acute distress  Psych:  Alert and oriented, normal mood and affect Cardiovascular:  Nl S1 and S2, RRR without murmur, gallop or rub. no edema Respiratory:  Good breath sounds bilaterally, CTAB with normal effort, no rales Gastrointestinal: normal BS,  soft, nontender  Diabetic education: ongoing education regarding chronic disease management for diabetes was given today. We continue to reinforce the ABC's of diabetic management: A1c (<7 or 8 dependent upon patient), tight blood pressure control, and cholesterol management with goal LDL < 100 minimally. We discuss diet strategies, exercise recommendations, medication options and possible side effects. At each visit, we review recommended immunizations and preventive care recommendations for diabetics and stress that good diabetic control can prevent other problems. See below for this patient's data.    Commons side effects, risks, benefits, and alternatives for medications and treatment plan prescribed today were discussed, and the patient expressed understanding of the given instructions. Patient is instructed to call or message via MyChart if he/she has any questions or concerns regarding our treatment plan. No barriers to understanding were identified. We discussed Red Flag symptoms and signs in detail. Patient expressed understanding regarding what to do in case of urgent or emergency type symptoms.   Medication list was reconciled, printed and provided to the patient in AVS. Patient  instructions and summary information was reviewed with the patient as documented in the AVS. This note was prepared with assistance of Dragon voice recognition software. Occasional wrong-word or sound-a-like substitutions may have occurred due to the inherent limitations of voice recognition software  This visit occurred during the SARS-CoV-2 public health emergency.  Safety protocols were in place, including screening questions prior to the visit, additional usage of staff PPE, and extensive cleaning of exam room while observing appropriate contact time as indicated for disinfecting solutions.

## 2020-10-06 NOTE — Patient Instructions (Addendum)
Please return in June 2022 for your annual complete physical; please come fasting.  Take the prilosec daily for the next 4-12 weeks to calm down your gerd symptoms.  Our records show you only had one of the two recommended doses of the shingrix vaccine.  If you had 2, let us know; if not, take the Rx and go get the second one.   If you have any questions or concerns, please don't hesitate to send me a message via MyChart or call the office at [phone]. Thank you for visiting with [place] today! It's our pleasure caring for you.

## 2020-10-07 ENCOUNTER — Telehealth: Payer: Self-pay | Admitting: General Practice

## 2020-10-07 MED ORDER — METOPROLOL SUCCINATE ER 25 MG PO TB24: 25.0000 mg | ORAL_TABLET | Freq: Every day | ORAL | 12 refills | Status: DC

## 2020-10-07 NOTE — Telephone Encounter (Signed)
    Pt c/o medication issue:  1. Name of Medication: metoprolol succinate (TOPROL-XL) 25 MG 24 hr tablet  2. How are you currently taking this medication (dosage and times per day)?   3. Are you having a reaction (difficulty breathing--STAT)?   4. What is your medication issue? Pt was asked to call back to give her dosage of her metoprolol, she said she take 1 tablet 25 mg twice a day, 1 in the morning and 1 at night.

## 2020-11-09 IMAGING — US US BREAST*L* LIMITED INC AXILLA
2 series · 12 of 12 positions shown · non-contrast
Comparison: Previous exam(s).

CLINICAL DATA: Patient recalled from screening for left breast
mass.

EXAM:
DIGITAL DIAGNOSTIC LEFT MAMMOGRAM WITH CAD AND TOMO
ULTRASOUND LEFT BREAST

[Series 1: us breast*left* limited inc axilla · 0.06mm/px · 11 of 11 slices shown (1 of 2)]
[im 1/11]
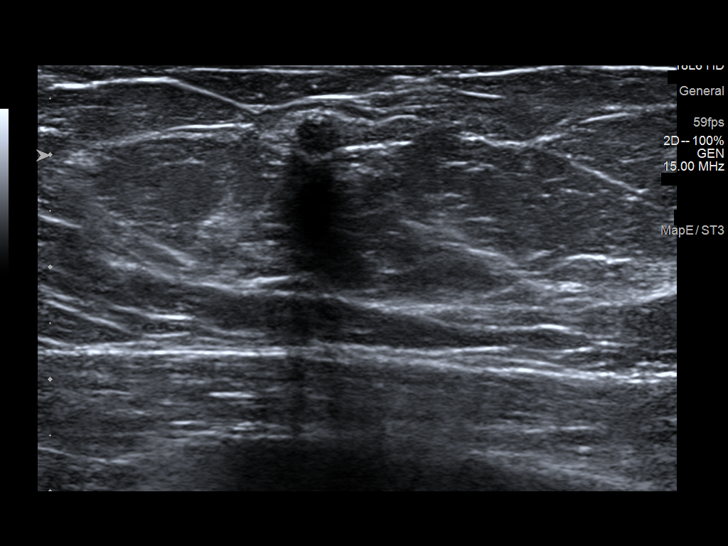
[im 2/11]
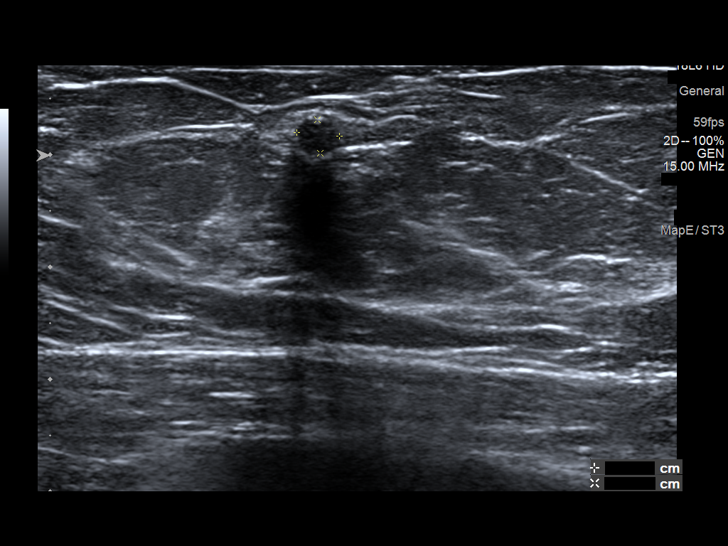
[im 3/11]
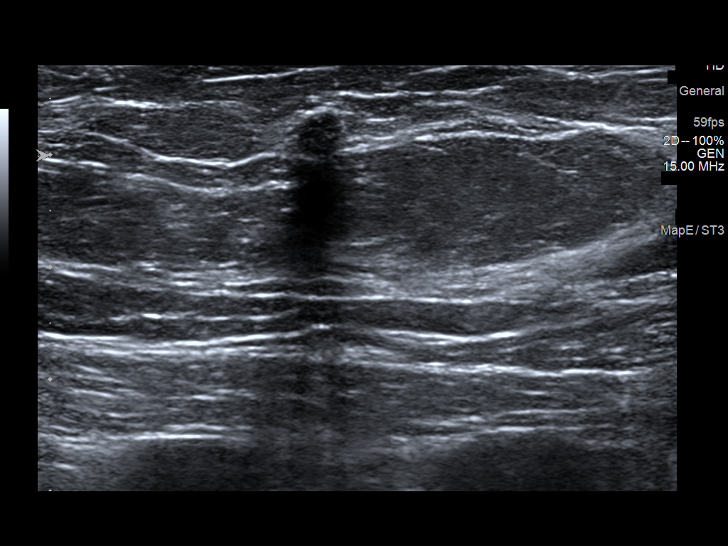
[im 4/11]
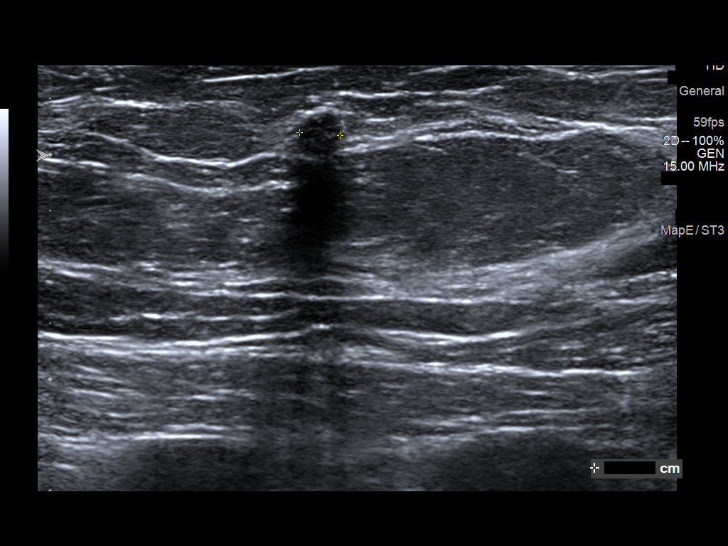
[im 5/11]
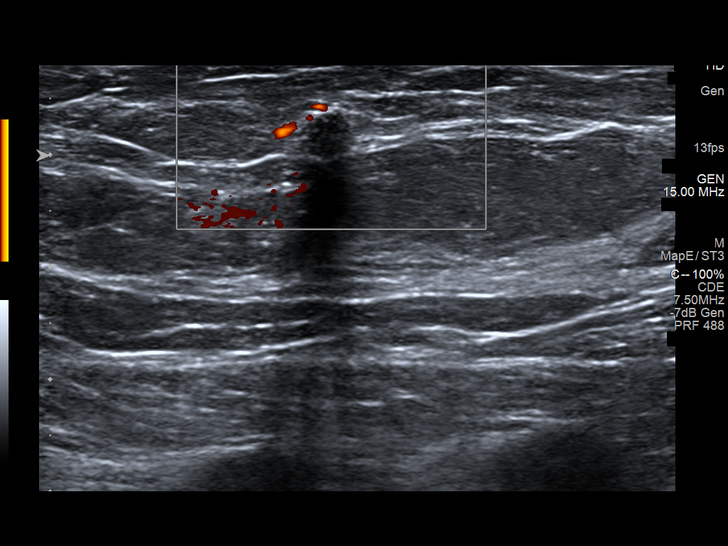
[im 6/11]
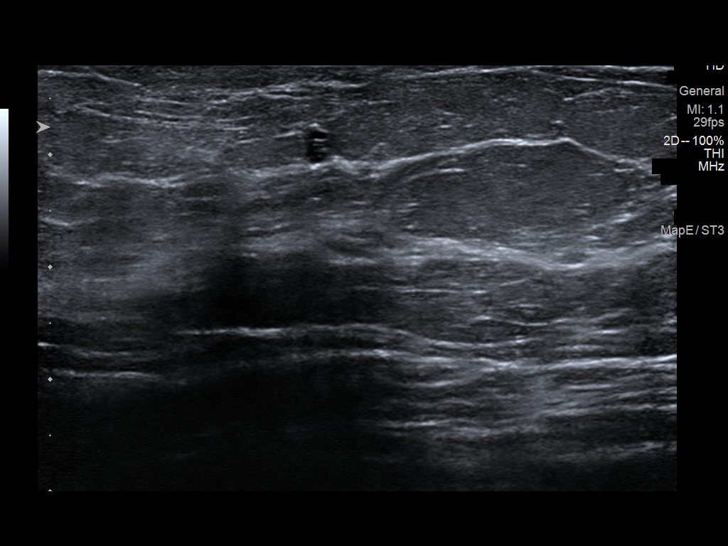
[im 7/11]
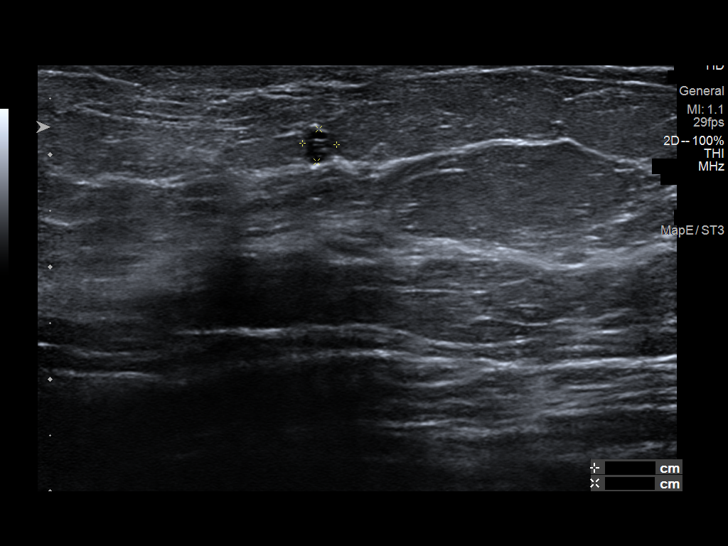
[im 8/11]
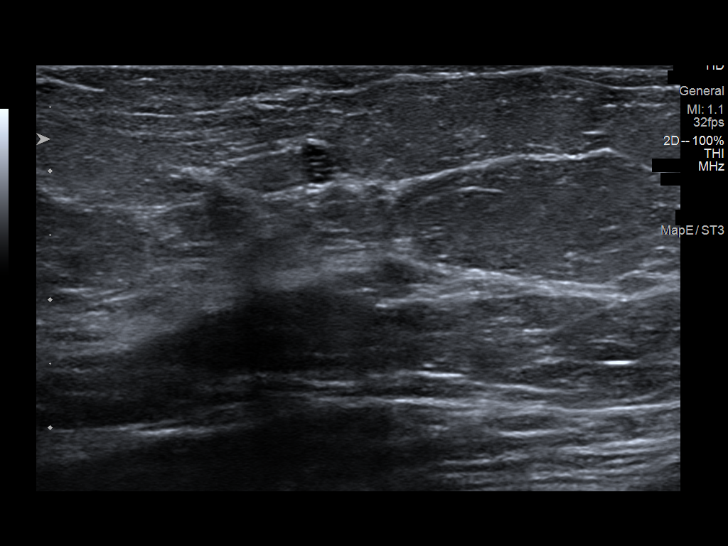
[im 9/11]
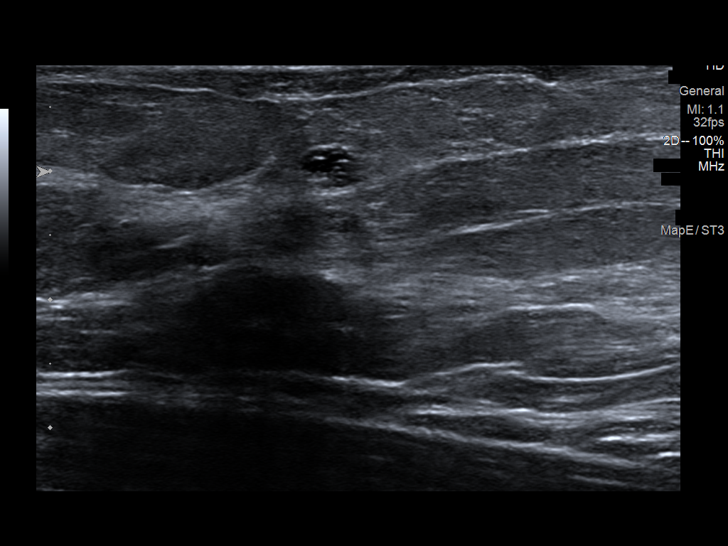
[im 10/11]
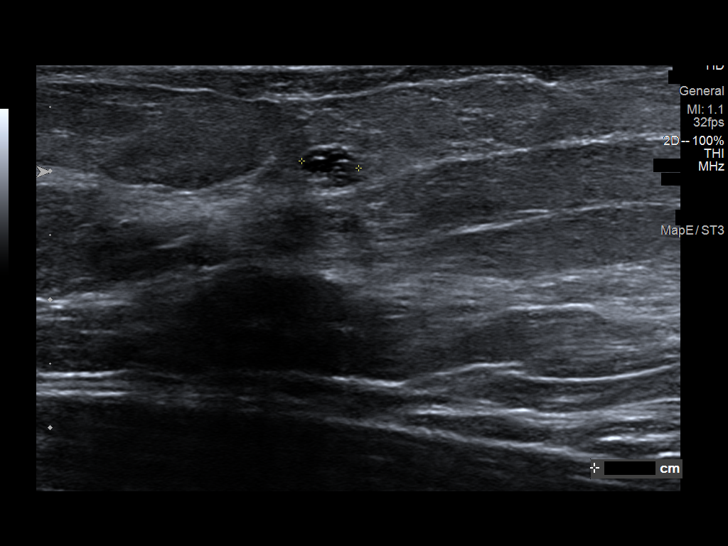
[im 11/11]
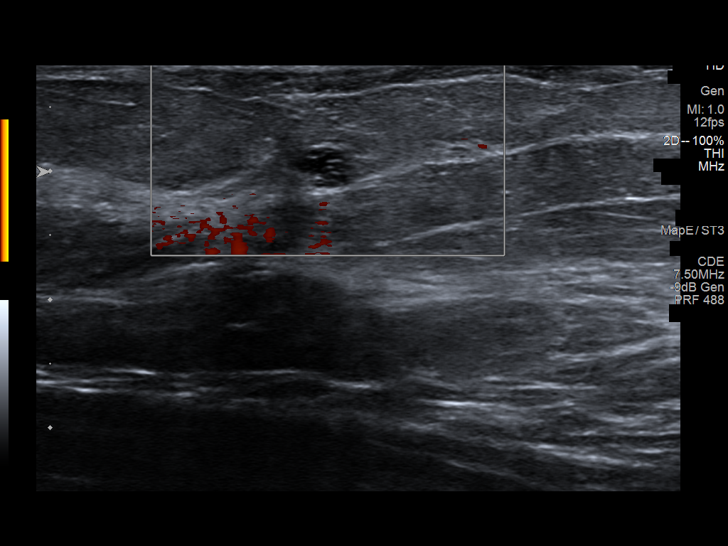

[Series 2: us breast*left* limited inc axilla · 0.07mm/px · 1 of 1 slices shown (2 of 2)]
[im 1/1]
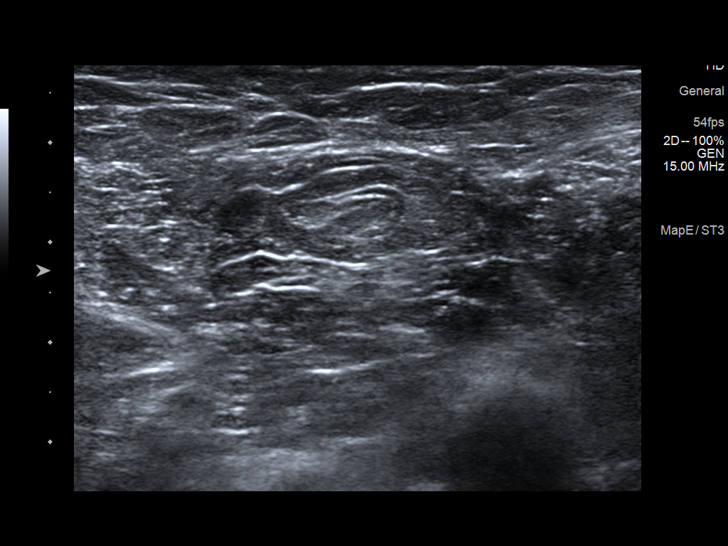

[12 of 12 positions shown; findings below may reference images not displayed]

ACR Breast Density Category b: There are scattered areas of
fibroglandular density.
FINDINGS: Persistent small round mass within the superior left breast middle
depth. This was further evaluated with spot compression imaging.

Mammographic images were processed with CAD.

Targeted ultrasound is performed, showing a 3 x 3 x 5 mm solid and
cystic mass left breast 12:30 o'clock 7 cm from the nipple.

Within the left breast 11 o'clock position 7 cm from nipple there is
a 3 x 3 x 4 mm peripheral calcified wall cyst.

No left axillary adenopathy.
IMPRESSION: Indeterminate left breast mass 12:30 o'clock.

RECOMMENDATION:
Ultrasound-guided core needle biopsy left breast mass 12:30 o'clock.

I have discussed the findings and recommendations with the patient.
If applicable, a reminder letter will be sent to the patient
regarding the next appointment.

BI-RADS CATEGORY  4: Suspicious.

## 2021-01-20 ENCOUNTER — Other Ambulatory Visit: Payer: Self-pay | Admitting: Family Medicine

## 2021-01-24 ENCOUNTER — Encounter: Payer: Self-pay | Admitting: Family Medicine

## 2021-02-02 ENCOUNTER — Other Ambulatory Visit: Payer: Self-pay

## 2021-02-02 ENCOUNTER — Encounter: Payer: Self-pay | Admitting: Family Medicine

## 2021-02-02 ENCOUNTER — Ambulatory Visit: Payer: Medicare PPO | Admitting: Family Medicine

## 2021-02-02 VITALS — BP 138/90 | HR 92 | Temp 98.0°F | Ht 64.0 in | Wt 182.2 lb

## 2021-02-02 DIAGNOSIS — D3500 Benign neoplasm of unspecified adrenal gland: Secondary | ICD-10-CM

## 2021-02-02 DIAGNOSIS — K219 Gastro-esophageal reflux disease without esophagitis: Secondary | ICD-10-CM | POA: Diagnosis not present

## 2021-02-02 DIAGNOSIS — Z0000 Encounter for general adult medical examination without abnormal findings: Secondary | ICD-10-CM

## 2021-02-02 DIAGNOSIS — M542 Cervicalgia: Secondary | ICD-10-CM | POA: Diagnosis not present

## 2021-02-02 DIAGNOSIS — E119 Type 2 diabetes mellitus without complications: Secondary | ICD-10-CM | POA: Diagnosis not present

## 2021-02-02 DIAGNOSIS — I10 Essential (primary) hypertension: Secondary | ICD-10-CM

## 2021-02-02 DIAGNOSIS — M8580 Other specified disorders of bone density and structure, unspecified site: Secondary | ICD-10-CM

## 2021-02-02 DIAGNOSIS — F4322 Adjustment disorder with anxiety: Secondary | ICD-10-CM

## 2021-02-02 LAB — COMPREHENSIVE METABOLIC PANEL
ALT: 42 U/L — ABNORMAL HIGH (ref 0–35)
AST: 37 U/L (ref 0–37)
Albumin: 4.5 g/dL (ref 3.5–5.2)
Alkaline Phosphatase: 71 U/L (ref 39–117)
BUN: 13 mg/dL (ref 6–23)
CO2: 28 mEq/L (ref 19–32)
Calcium: 10.3 mg/dL (ref 8.4–10.5)
Chloride: 90 mEq/L — ABNORMAL LOW (ref 96–112)
Creatinine, Ser: 0.82 mg/dL (ref 0.40–1.20)
GFR: 67.12 mL/min (ref >60.00)
Glucose, Bld: 128 mg/dL — ABNORMAL HIGH (ref 70–99)
Potassium: 3.4 mEq/L — ABNORMAL LOW (ref 3.5–5.1)
Sodium: 129 mEq/L — ABNORMAL LOW (ref 135–145)
Total Bilirubin: 0.8 mg/dL (ref 0.2–1.2)
Total Protein: 7.8 g/dL (ref 6.0–8.3)

## 2021-02-02 LAB — CBC WITH DIFFERENTIAL/PLATELET
Basophils Absolute: 0 10*3/uL (ref 0.0–0.1)
Basophils Relative: 0.8 % (ref 0.0–3.0)
Eosinophils Absolute: 0.1 10*3/uL (ref 0.0–0.7)
Eosinophils Relative: 2 % (ref 0.0–5.0)
HCT: 40.8 % (ref 36.0–46.0)
Hemoglobin: 14.2 g/dL (ref 12.0–15.0)
Lymphocytes Relative: 22.6 % (ref 12.0–46.0)
Lymphs Abs: 1.3 10*3/uL (ref 0.7–4.0)
MCHC: 34.8 g/dL (ref 30.0–36.0)
MCV: 85 fl (ref 78.0–100.0)
Monocytes Absolute: 0.6 10*3/uL (ref 0.1–1.0)
Monocytes Relative: 10.3 % (ref 3.0–12.0)
Neutro Abs: 3.8 10*3/uL (ref 1.4–7.7)
Neutrophils Relative %: 64.3 % (ref 43.0–77.0)
Platelets: 214 10*3/uL (ref 150.0–400.0)
RBC: 4.8 Mil/uL (ref 3.87–5.11)
RDW: 13 % (ref 11.5–15.5)
WBC: 5.9 10*3/uL (ref 4.0–10.5)

## 2021-02-02 LAB — LIPID PANEL
Cholesterol: 86 mg/dL (ref 0–200)
HDL: 51 mg/dL (ref >39.00)
LDL Cholesterol: 13 mg/dL (ref 0–99)
NonHDL: 35.27
Total CHOL/HDL Ratio: 2
Triglycerides: 112 mg/dL (ref 0.0–149.0)
VLDL: 22.4 mg/dL (ref 0.0–40.0)

## 2021-02-02 LAB — TSH: TSH: 1.06 u[IU]/mL (ref 0.35–4.50)

## 2021-02-02 LAB — POCT GLYCOSYLATED HEMOGLOBIN (HGB A1C): Hemoglobin A1C: 7 % — AB (ref 4.0–5.6)
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
## 2021-02-02 NOTE — Patient Instructions (Signed)
Please return in 3 months for diabetes follow up   I will release your lab results to you on your MyChart account with further instructions. Please reply with any questions.    If you have any questions or concerns, please don't hesitate to send me a message via MyChart or call the office at [phone]. Thank you for visiting with [place] today! It's our pleasure caring for you.   Textbook of family medicine (9th ed., pp. 0263-7858). [place], [place]: [employer].">  Stress, Adult Stress is a normal reaction to life events. Stress is what you feel when life demands more than you are used to, or more than you think you can handle. Some stress can be useful, such as studying for a test or meeting a deadline at work. Stress that occurs too often or for too long can cause problems. It can affect your emotional health and interfere with relationships and normal daily activities. Too much stress can weaken your body's defense system (immune system) and increase your risk for physical illness. If you already have a medicalproblem, stress can make it worse. What are the causes? All sorts of life events can cause stress. An event that causes stress for one person may not be stressful for another person. Major life events, whether positive or negative, commonly cause stress. Examples include: Losing a job or starting a new job. Losing a loved one. Moving to a new town or home. Getting married or divorced. Having a baby. Getting injured or sick. Less obvious life events can also cause stress, especially if they occur day after day or in combination with each other. Examples include: Working long hours. Driving in traffic. Caring for children. Being in debt. Being in a difficult relationship. What are the signs or symptoms? Stress can cause emotional symptoms, including: Anxiety. This is feeling worried, afraid, on edge, overwhelmed, or out of control. Anger, including irritation or impatience. Depression.  This is feeling sad, down, helpless, or guilty. Trouble focusing, remembering, or making decisions. Stress can cause physical symptoms, including: Aches and pains. These may affect your head, neck, back, stomach, or other areas of your body. Tight muscles or a clenched jaw. Low energy. Trouble sleeping. Stress can cause unhealthy behaviors, including: Eating to feel better (overeating) or skipping meals. Working too much or putting off tasks. Smoking, drinking alcohol, or using drugs to feel better. How is this diagnosed? Stress is diagnosed through an assessment by your health care provider. He or she may diagnose this condition based on: Your symptoms and any stressful life events. Your medical history. Tests to rule out other causes of your symptoms. Depending on your condition, your health care provider may refer you to aspecialist for further evaluation. How is this treated?  Stress management techniques are the recommended treatment for stress. Medicineis not typically recommended for the treatment of stress. Techniques to reduce your reaction to stressful life events include: Stress identification. Monitor yourself for symptoms of stress and identify what causes stress for you. These skills may help you to avoid or prepare for stressful events. Time management. Set your priorities, keep a calendar of events, and learn to say no. Taking these actions can help you avoid making too many commitments. Techniques for coping with stress include: Rethinking the problem. Try to think realistically about stressful events rather than ignoring them or overreacting. Try to find the positives in a stressful situation rather than focusing on the negatives. Exercise. Physical exercise can release both physical and emotional tension. The key  is to find a form of exercise that you enjoy and do it regularly. Relaxation techniques. These relax the body and mind. The key is to find one or more that you  enjoy and use the techniques regularly. Examples include: Meditation, deep breathing, or progressive relaxation techniques. Yoga or tai chi. Biofeedback, mindfulness techniques, or journaling. Listening to music, being out in nature, or participating in other hobbies. Practicing a healthy lifestyle. Eat a balanced diet, drink plenty of water, limit or avoid caffeine, and get plenty of sleep. Having a strong support network. Spend time with family, friends, or other people you enjoy being around. Express your feelings and talk things over with someone you trust. Counseling or talk therapy with a mental health professional may be helpful if you are havingtrouble managing stress on your own. Follow these instructions at home: Lifestyle  Avoid drugs. Do not use any products that contain nicotine or tobacco, such as cigarettes, e-cigarettes, and chewing tobacco. If you need help quitting, ask your health care provider. Limit alcohol intake to no more than 1 drink a day for nonpregnant women and 2 drinks a day for men. One drink equals 12 oz of beer, 5 oz of wine, or 1 oz of hard liquor Do not use alcohol or drugs to relax. Eat a balanced diet that includes fresh fruits and vegetables, whole grains, lean meats, fish, eggs, and beans, and low-fat dairy. Avoid processed foods and foods high in added fat, sugar, and salt. Exercise at least 30 minutes on 5 or more days each week. Get 7-8 hours of sleep each night.  General instructions  Practice stress management techniques as discussed with your health care provider. Drink enough fluid to keep your urine clear or pale yellow. Take over-the-counter and prescription medicines only as told by your health care provider. Keep all follow-up visits as told by your health care provider. This is important.  Contact a health care provider if: Your symptoms get worse. You have new symptoms. You feel overwhelmed by your problems and can no longer manage  them on your own. Get help right away if: You have thoughts of hurting yourself or others. If you ever feel like you may hurt yourself or others, or have thoughts about taking your own life, get help right away. You can go to your nearest emergency department or call: Your local emergency services (911 in the [place] A suicide crisis helpline, such as the [facility] at 270-661-3857. This is open 24 hours a day. Summary Stress is a normal reaction to life events. It can cause problems if it happens too often or for too long. Practicing stress management techniques is the best way to treat stress. Counseling or talk therapy with a mental health professional may be helpful if you are having trouble managing stress on your own. This information is not intended to replace advice given to you by your health care provider. Make sure you discuss any questions you have with your healthcare provider. Document Revised: 04/23/2020 Document Reviewed: 04/23/2020 Elsevier Patient Education  2022 [employer].

## 2021-02-04 NOTE — Progress Notes (Signed)
Please call patient: I have reviewed his/her lab results. Sodium and potassium are both low. I will forward to cards to see if he would like to adjust bp meds (may be contributing).  Also one liver test is just above normal and I will monitor this for her.  Work on diet and diabetes and I will recheck these things in 3 months.   Dr. Sallyanne Kuster, Please see sodium and potassium.  Thanks.

## 2021-02-08 ENCOUNTER — Telehealth: Payer: Self-pay | Admitting: General Practice

## 2021-02-08 NOTE — Telephone Encounter (Signed)
Patient states her PCP was supposed to let the office know about her lab results but she has not heard anything. She states her sodium and potassium were low.

## 2021-02-08 NOTE — Telephone Encounter (Signed)
Returned call to patient- patient states she went to PCP last week and did labwork and was informed this would be sent to Dr. Sallyanne Kuster to review.    She is calling to follow up on this.     [name], MD  02/04/2021  2:19 PM EDT      Please call patient: I have reviewed his/her lab results. Sodium and potassium are both low. I will forward to cards to see if he would like to adjust bp meds(may be contributing).  Also one liver test is just above normal and I will monitor this for her.  Work on diet and diabetes and I will recheck these things in 3 months.    Dr. Sallyanne Kuster, Please see sodium and potassium.  Thanks.     Advised Dr. Sallyanne Kuster out of office this week but will route to Coletta Memos NP who saw patient 10/06/20.   Patient aware and verbalized understanding.

## 2021-02-09 ENCOUNTER — Encounter: Payer: Medicare PPO | Admitting: Family Medicine

## 2021-02-10 NOTE — Telephone Encounter (Signed)
Diets mailed

## 2021-02-10 NOTE — Telephone Encounter (Signed)
Sabrina Memos,  NP  You 02-08-21 Please contact Sabrina Aguirre and let her know that her lab values have been reviewed.  Her potassium and sodium are slightly low and her most recent lab work.  She may slightly increase the sodium in her diet and increase the consumption of potassium rich foods.  We will continue with her current medications at this time.  Thank you.  Pt informed of providers result & recommendations. Pt verbalized understanding. No further questions.

## 2021-02-14 ENCOUNTER — Telehealth: Payer: Self-pay

## 2021-02-14 MED ORDER — BENZONATATE 200 MG PO CAPS: 200.0000 mg | ORAL_CAPSULE | Freq: Two times a day (BID) | ORAL | 0 refills | Status: DC | PRN

## 2021-02-14 NOTE — Telephone Encounter (Signed)
Chart reviewed.  Patient has used [facility] in the past.  Last filled in September.  Refill #20 today.  Office visit if not proving.

## 2021-02-14 NOTE — Telephone Encounter (Signed)
Please advise 

## 2021-02-14 NOTE — Telephone Encounter (Signed)
Patient is calling in stating that her cough is coming back again and is wanting a refill on her cough medicine, cant remember the name of prescription.

## 2021-02-14 NOTE — Addendum Note (Signed)
Addended by: Billey Chang on: 02/14/2021 04:38 PM   Modules accepted: Orders

## 2021-02-17 ENCOUNTER — Telehealth: Payer: Self-pay

## 2021-02-17 NOTE — Telephone Encounter (Signed)
Spoke to pt told her Dr. Jonni Sanger said at your visit we discussed monitoring your blood pressures at home and letting her know if they run high. She said I did not ask her to increase her dose of toprol xl yet. Pt said they have been running the same averaging 148/97. Told pt I will let Dr. Jonni Sanger know and get back to you tomorrow on how much medication to take. Pt verbalized understanding and said she will only take one tomorrow morning until she talks to [place].

## 2021-02-17 NOTE — Telephone Encounter (Signed)
Spoke to pt told her according to Cardiology I have you are to be taking Metoprolol 25 mg daily and they are prescribing it. Pt said when she saw Dr. Jonni Sanger on 6/15 she told her to increase her Metoprolol to 50 mg daily so she has been taking 2 tablets daily and is almost out. Told pt I will send message to Dr. Jonni Sanger and Clarify dosage of Metoprolol and get back to her. Pt verbalized understanding.

## 2021-02-17 NOTE — Telephone Encounter (Signed)
  LAST APPOINTMENT DATE: 02/02/2021   NEXT APPOINTMENT DATE:@9 /28/2022  MEDICATION:metoprolol succinate (TOPROL-XL) 25 MG 24 hr tablet  PHARMACY: [facility] [place].  Comments: Patient is needing the new prescription for 50 MG, as she has three left in her old bottle.    Please advise

## 2021-02-17 NOTE — Telephone Encounter (Signed)
Dr.Andy pt said they have been the same running 148/97. Please advise about medication.

## 2021-02-17 NOTE — Telephone Encounter (Signed)
Dr. Jonni Sanger, please see message and advise what dosage pt should be taking for Metoprolol and how often? Thanks

## 2021-02-18 ENCOUNTER — Other Ambulatory Visit: Payer: Self-pay | Admitting: *Deleted

## 2021-02-18 DIAGNOSIS — I10 Essential (primary) hypertension: Secondary | ICD-10-CM

## 2021-02-18 MED ORDER — METOPROLOL SUCCINATE ER 25 MG PO TB24: 25.0000 mg | ORAL_TABLET | Freq: Every day | ORAL | 12 refills | Status: DC

## 2021-02-18 MED ORDER — TELMISARTAN-HCTZ 80-12.5 MG PO TABS: 1.0000 | ORAL_TABLET | Freq: Every day | ORAL | 5 refills | Status: DC

## 2021-02-18 NOTE — Telephone Encounter (Signed)
Dr.Andy, Spoke to pt and she said Cardiology called her this morning and they told her to continue Metoprolol ER 25 mg daily and decreased her HCTZ in her combo pill to 12.5 mg instead. I asked pt if she told them what her blood pressure has been and she said yes. Pt said what should I do? Told pt to do what Cardiology said. Please advise

## 2021-02-18 NOTE — Addendum Note (Signed)
Addended by: Marian Sorrow on: 02/18/2021 01:12 PM   Modules accepted: Orders

## 2021-02-18 NOTE — Telephone Encounter (Signed)
Spoke to pt told her Dr. Jonni Sanger said to increase Metoprolol ER to 50 mg daily. Pt said she received a call from Cardiology this morning and they told her to continue Metoprolol ER 25 mg daily and decreased her HCTZ in her combo pill to 12.5 mg instead. Pt said what should I do. Told pt to do what Cardiology said to do and I will let Dr. Jonni Sanger know if any changes will call you back on Tuesday. Pt verbalized understanding.

## 2021-02-25 ENCOUNTER — Other Ambulatory Visit: Payer: Self-pay | Admitting: Family Medicine

## 2021-03-01 ENCOUNTER — Telehealth: Payer: Self-pay

## 2021-03-01 NOTE — Telephone Encounter (Signed)
Please see message and advise 

## 2021-03-01 NOTE — Telephone Encounter (Signed)
Spoke to pt told her Dr. Jonni Sanger said your cardiologist adjusted her medications to help with this potassium problem. She is due for a repeat blood test to recheck. I believe this was scheduled with his office. Pt said yes, but not till Sept. I thought if I got a Rx for my potassium it would help instead of OTC supplement I am taking. Asked pt did Cardiology tell you to take a supplement? Pt said no, I just did it to help. Told pt you should be following what Cardiology tells you to do and that Dr. Jonni Sanger is not following you for this problem. Pt verbalized understanding. Told pt she can eat a banana a day to help with potassium but should not be taking supplement unless told to by Cardiology. Follow up with them as scheduled. Pt verbalized understanding.

## 2021-03-01 NOTE — Telephone Encounter (Signed)
Patient states that  her potassium levels were low so she got OTC potassium 90 mg. Doesn't feel like its beneficial and would like Dr.Andy to prescribe something.

## 2021-03-24 ENCOUNTER — Encounter: Payer: Self-pay | Admitting: Family Medicine

## 2021-03-24 ENCOUNTER — Telehealth: Payer: Self-pay

## 2021-03-24 ENCOUNTER — Telehealth (INDEPENDENT_AMBULATORY_CARE_PROVIDER_SITE_OTHER): Payer: Medicare PPO | Admitting: Family Medicine

## 2021-03-24 ENCOUNTER — Other Ambulatory Visit: Payer: Self-pay

## 2021-03-24 DIAGNOSIS — R059 Cough, unspecified: Secondary | ICD-10-CM | POA: Diagnosis not present

## 2021-03-24 DIAGNOSIS — R0981 Nasal congestion: Secondary | ICD-10-CM

## 2021-03-24 DIAGNOSIS — R519 Headache, unspecified: Secondary | ICD-10-CM

## 2021-03-24 MED ORDER — AZITHROMYCIN 250 MG PO TABS: ORAL_TABLET | ORAL | 0 refills | Status: DC

## 2021-03-24 MED ORDER — BENZONATATE 200 MG PO CAPS: 200.0000 mg | ORAL_CAPSULE | Freq: Two times a day (BID) | ORAL | 0 refills | Status: DC | PRN

## 2021-03-24 NOTE — Telephone Encounter (Signed)
Patient is scheduled   
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
## 2021-03-24 NOTE — Telephone Encounter (Signed)
Please call pt and schedule a virtual visit if she does not want to come into the office. We can not send in medication without her being seen.

## 2021-03-24 NOTE — Patient Instructions (Signed)
-  I sent the medication(s) we discussed to your pharmacy: Meds ordered this encounter  Medications   azithromycin (ZITHROMAX) 250 MG tablet    Sig: 2 tabs day 1, then one tab daily    Dispense:  6 tablet    Refill:  0   benzonatate (TESSALON) 200 MG capsule    Sig: Take 1 capsule (200 mg total) by mouth 2 (two) times daily as needed for cough.    Dispense:  20 capsule    Refill:  0   Please do a covid test. If using a home test, recheck in 24-36 hours. If positive please follow up with your primary care office or with [place].  I hope you are feeling better soon!  Seek in person care promptly if your symptoms worsen, new concerns arise or you are not improving with treatment.  It was nice to meet you today. I help [facility] out with telemedicine visits on Tuesdays and Thursdays and am available for visits on those days. If you have any concerns or questions following this visit please schedule a follow up visit with your Primary Care doctor or seek care at a local urgent care clinic to avoid delays in care.

## 2021-03-24 NOTE — Telephone Encounter (Signed)
Patient called in stating that she currently has the yearly sinus infection and wanted Dr.Andy to send in a zpack and cough medicine. Does not want an appointment as she states its in her records that this happens every year around the same time.

## 2021-04-21 ENCOUNTER — Other Ambulatory Visit: Payer: Self-pay | Admitting: Family Medicine

## 2021-04-21 DIAGNOSIS — Z1231 Encounter for screening mammogram for malignant neoplasm of breast: Secondary | ICD-10-CM
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
## 2021-04-27 ENCOUNTER — Ambulatory Visit: Payer: Medicare PPO | Admitting: General Practice

## 2021-04-27 ENCOUNTER — Other Ambulatory Visit: Payer: Self-pay | Admitting: Family Medicine

## 2021-04-27 ENCOUNTER — Other Ambulatory Visit: Payer: Self-pay

## 2021-04-27 ENCOUNTER — Encounter: Payer: Self-pay | Admitting: General Practice

## 2021-04-27 VITALS — BP 132/86 | HR 88 | Ht 64.0 in | Wt 180.2 lb

## 2021-04-27 DIAGNOSIS — E119 Type 2 diabetes mellitus without complications: Secondary | ICD-10-CM

## 2021-04-27 DIAGNOSIS — G4723 Circadian rhythm sleep disorder, irregular sleep wake type: Secondary | ICD-10-CM | POA: Diagnosis not present

## 2021-04-27 DIAGNOSIS — R002 Palpitations: Secondary | ICD-10-CM

## 2021-04-27 DIAGNOSIS — R0789 Other chest pain: Secondary | ICD-10-CM | POA: Diagnosis not present

## 2021-04-27 DIAGNOSIS — K2100 Gastro-esophageal reflux disease with esophagitis, without bleeding: Secondary | ICD-10-CM

## 2021-04-27 DIAGNOSIS — I10 Essential (primary) hypertension: Secondary | ICD-10-CM | POA: Diagnosis not present

## 2021-04-27 DIAGNOSIS — Z79899 Other long term (current) drug therapy: Secondary | ICD-10-CM

## 2021-04-27 MED ORDER — TELMISARTAN-HCTZ 80-25 MG PO TABS: 1.0000 | ORAL_TABLET | Freq: Every day | ORAL | 7 refills | Status: DC

## 2021-04-27 NOTE — Patient Instructions (Signed)
Medication Instructions:  INCREASE TELMISARTAN 80/'25MG'$  DAILY *If you need a refill on your cardiac medications before your next appointment, please call your pharmacy*  Lab Work: BMET IN 1 WEEK If you have labs (blood work) drawn today and your tests are completely normal, you will receive your results only by:  [facility] (if you have MyChart) OR A paper copy in the mail.  If you have any lab test that is abnormal or we need to change your treatment, we will call you to review the results. You may go to any [facility] that is convenient for you however, we do have a lab in our office that is able to assist you. You DO NOT need an appointment for our lab. The lab is open 8:00am and closes at 4:00pm. Lunch 12:45 - 1:45pm.  Special Instructions TRY XYZAL INSTEAD OF ZYRTEC  PLEASE READ AND FOLLOW SALTY 6-ATTACHED-1,800 mg daily  PLEASE MAINTAIN PHYSICAL ACTIVITY AS TOLERATED  Follow-Up: Your next appointment:  6 month(s) In Person with Sanda Klein, MD   At [facility], you and your health needs are our priority.  As part of our continuing mission to provide you with exceptional heart care, we have created designated Provider Care Teams.  These Care Teams include your primary Cardiologist (physician) and Advanced Practice Providers (APPs -  Physician Assistants and Nurse Practitioners) who all work together to provide you with the care you need, when you need it.  We recommend signing up for the patient portal called "MyChart".  Sign up information is provided on this After Visit Summary.  MyChart is used to connect with patients for Virtual Visits (Telemedicine).  Patients are able to view lab/test results, encounter notes, upcoming appointments, etc.  Non-urgent messages can be sent to your provider as well.   To learn more about what you can do with MyChart, go to [email or web address].

## 2021-05-09 ENCOUNTER — Encounter: Payer: Self-pay | Admitting: *Deleted

## 2021-05-09 ENCOUNTER — Other Ambulatory Visit: Payer: Self-pay | Admitting: *Deleted

## 2021-05-09 DIAGNOSIS — I10 Essential (primary) hypertension: Secondary | ICD-10-CM

## 2021-05-18 ENCOUNTER — Encounter: Payer: Self-pay | Admitting: Family Medicine

## 2021-05-18 ENCOUNTER — Other Ambulatory Visit: Payer: Self-pay | Admitting: Family Medicine

## 2021-05-18 ENCOUNTER — Other Ambulatory Visit: Payer: Self-pay

## 2021-05-18 ENCOUNTER — Telehealth: Payer: Self-pay

## 2021-05-18 ENCOUNTER — Ambulatory Visit: Payer: Medicare PPO | Admitting: Family Medicine

## 2021-05-18 VITALS — BP 130/80 | HR 90 | Temp 97.9°F | Ht 64.0 in | Wt 180.6 lb

## 2021-05-18 DIAGNOSIS — Z23 Encounter for immunization: Secondary | ICD-10-CM | POA: Diagnosis not present

## 2021-05-18 DIAGNOSIS — I10 Essential (primary) hypertension: Secondary | ICD-10-CM | POA: Diagnosis not present

## 2021-05-18 DIAGNOSIS — E871 Hypo-osmolality and hyponatremia: Secondary | ICD-10-CM | POA: Diagnosis not present

## 2021-05-18 DIAGNOSIS — E876 Hypokalemia: Secondary | ICD-10-CM | POA: Diagnosis not present

## 2021-05-18 DIAGNOSIS — R053 Chronic cough: Secondary | ICD-10-CM | POA: Diagnosis not present

## 2021-05-18 DIAGNOSIS — J309 Allergic rhinitis, unspecified: Secondary | ICD-10-CM

## 2021-05-18 DIAGNOSIS — E119 Type 2 diabetes mellitus without complications: Secondary | ICD-10-CM

## 2021-05-18 DIAGNOSIS — R002 Palpitations: Secondary | ICD-10-CM | POA: Diagnosis not present

## 2021-05-18 LAB — BASIC METABOLIC PANEL
BUN: 11 mg/dL (ref 6–23)
CO2: 30 mEq/L (ref 19–32)
Calcium: 10.2 mg/dL (ref 8.4–10.5)
Chloride: 93 mEq/L — ABNORMAL LOW (ref 96–112)
Creatinine, Ser: 0.74 mg/dL (ref 0.40–1.20)
GFR: 75.76 mL/min (ref >60.00)
Glucose, Bld: 151 mg/dL — ABNORMAL HIGH (ref 70–99)
Potassium: 3.3 mEq/L — ABNORMAL LOW (ref 3.5–5.1)
Sodium: 133 mEq/L — ABNORMAL LOW (ref 135–145)

## 2021-05-18 LAB — MAGNESIUM: Magnesium: 1.4 mg/dL — ABNORMAL LOW (ref 1.5–2.5)

## 2021-05-18 MED ORDER — BENZONATATE 100 MG PO CAPS: 100.0000 mg | ORAL_CAPSULE | Freq: Two times a day (BID) | ORAL | 0 refills | Status: DC | PRN

## 2021-05-18 MED ORDER — FLUTICASONE PROPIONATE 50 MCG/ACT NA SUSP: 1.0000 | Freq: Every day | NASAL | 6 refills | Status: DC

## 2021-05-18 MED ORDER — METFORMIN HCL 500 MG PO TABS: 500.0000 mg | ORAL_TABLET | Freq: Two times a day (BID) | ORAL | 3 refills | Status: DC

## 2021-05-18 NOTE — Progress Notes (Signed)
Subjective  CC:  Chief Complaint  Patient presents with   Diabetes   Hypertension    HPI: Sabrina Aguirre is a 80 y.o. female who presents to the office today for follow up of diabetes and problems listed above in the chief complaint.  Diabetes follow up: Her diabetic control is reported as elevated. Blood sugars remain high. Has been diet controlled for awhile. Reports diet is good. No sxs of hyperglycemia. Has tolerated metformin in past.  She denies exertional CP or SOB or symptomatic hypoglycemia. She denies foot sores or paresthesias.  HTN: on micardis/hctz; back to 25 of hct by recent cards visit due to elevated bp but never rechecked the sodium and potassium on the lower dose. Feelsfine.  C/o cough and pnd from allergies. On xyzal.   Wt Readings from Last 3 Encounters:  05/18/21 180 lb 9.6 oz (81.9 kg)  04/27/21 180 lb 3.2 oz (81.7 kg)  02/02/21 182 lb 3.2 oz (82.6 kg)    BP Readings from Last 3 Encounters:  05/18/21 130/80  04/27/21 132/86  02/02/21 138/90    Assessment  1. Type 2 diabetes mellitus without complication, without long-term current use of insulin ([place])   2. Essential (primary) hypertension   3. Hypokalemia   4. Hyponatremia   5. Need for immunization against influenza   6. Chronic allergic rhinitis   7. Persistent cough      Plan  Diabetes is currently marginally controlled. Restart met 500 bid to keep a1c < 7. Recheck 3 months. Flu shot today BP controlled. Recheck bmp; will need med adjustment if remains with low sodium/potassium. Education given Palpitations on metoprolol 25 daily. Some breakthrough sxs; will increase metoprolol - await blood test results first.  Allergies and cough: restart flonase and prn tessalon.    Follow up: 3 mo to recheck dm and bp. Orders Placed This Encounter  Procedures   Flu Vaccine QUAD High Dose(Fluad)   Basic metabolic panel   Magnesium    Meds ordered this encounter  Medications   metFORMIN (GLUCOPHAGE)  500 MG tablet    Sig: Take 1 tablet (500 mg total) by mouth 2 (two) times daily with a meal.    Dispense:  180 tablet    Refill:  3   fluticasone (FLONASE) 50 MCG/ACT nasal spray    Sig: Place 1 spray into both nostrils daily.    Dispense:  16 g    Refill:  6   benzonatate (TESSALON) 100 MG capsule    Sig: Take 1 capsule (100 mg total) by mouth 2 (two) times daily as needed for cough.    Dispense:  20 capsule    Refill:  0       Immunization History  Administered Date(s) Administered   Fluad Quad(high Dose 65+) 04/27/2020, 05/18/2021   Influenza Split 05/06/2012   Influenza Whole 11/02/2008, 05/10/2013   Influenza, High Dose Seasonal PF 05/12/2014, 05/26/2015, 05/22/2016, 04/25/2019   PFIZER(Purple Top)SARS-COV-2 Vaccination 09/11/2019, 10/02/2019, 05/17/2020, 11/21/2020   Pneumococcal Conjugate-13 05/10/2015   Pneumococcal Polysaccharide-23 05/19/2013, 04/25/2019   Tdap 11/13/2011   Zoster Recombinat (Shingrix) 09/01/2017, 01/28/2018   Zoster, Live 05/07/2012    Diabetes Related Lab Review: Lab Results  Component Value Date   HGBA1C 7.0 (A) 02/02/2021   HGBA1C 6.4 (A) 10/06/2020   HGBA1C 6.2 (A) 04/27/2020    No results found for: Derl Barrow Lab Results  Component Value Date   CREATININE 0.74 05/18/2021   BUN 11 05/18/2021   NA 133 (L) 05/18/2021  K 3.3 (L) 05/18/2021   CL 93 (L) 05/18/2021   CO2 30 05/18/2021   Lab Results  Component Value Date   CHOL 86 02/02/2021   CHOL 94 01/23/2020   CHOL 98 07/22/2017   Lab Results  Component Value Date   HDL 51.00 02/02/2021   HDL 48.50 01/23/2020   HDL 44 07/22/2017   Lab Results  Component Value Date   LDLCALC 13 02/02/2021   LDLCALC 23 01/23/2020   LDLCALC 28 07/22/2017   Lab Results  Component Value Date   TRIG 112.0 02/02/2021   TRIG 113.0 01/23/2020   TRIG 128 07/22/2017   Lab Results  Component Value Date   CHOLHDL 2 02/02/2021   CHOLHDL 2 01/23/2020   CHOLHDL 2.2 07/22/2017   No  results found for: LDLDIRECT The ASCVD Risk score (Arnett DK, et al., 2019) failed to calculate for the following reasons:   The 2019 ASCVD risk score is only valid for ages 22 to 55 I have reviewed the [place], Fam and Soc history. Patient Active Problem List   Diagnosis Date Noted   Type 2 diabetes mellitus without complication, without long-term current use of insulin ([place]) 11/13/2011    Priority: 1.   Essential (primary) hypertension 02/03/2011    Priority: 1.   Adrenal adenoma     Priority: 2.   Osteopenia 04/04/2011    Priority: 2.    Dexa 03/2020 lowest T = - 1.7; recheck 2 years.     Gastro-esophageal reflux disease without esophagitis 02/03/2011    Priority: 2.   Colon polyps 02/03/2011    Priority: 2.   Migraine without status migrainosus, not intractable 02/03/2011    Priority: 2.   Primary osteoarthritis of right hand 10/12/2016    Priority: 3.   Persistent cough 03/19/2015    Priority: 3.   Chronic allergic rhinitis 02/03/2011    Priority: 3.    Social History: Patient  reports that she has quit smoking. Her smoking use included cigarettes. She has never used smokeless tobacco. She reports that she does not drink alcohol and does not use drugs.  Review of Systems: Ophthalmic: negative for eye pain, loss of vision or double vision Cardiovascular: negative for chest pain Respiratory: negative for SOB or persistent cough Gastrointestinal: negative for abdominal pain Genitourinary: negative for dysuria or gross hematuria MSK: negative for foot lesions Neurologic: negative for weakness or gait disturbance  Objective  Vitals: BP 130/80   Pulse 90   Temp 97.9 F (36.6 C) (Temporal)   Ht $R'5\' 4"'gk$  (1.626 m)   Wt 180 lb 9.6 oz (81.9 kg)   SpO2 97%   BMI 31.00 kg/m  General: well appearing, no acute distress  Psych:  Alert and oriented, normal mood and affect HEENT:  Normocephalic, atraumatic, moist mucous membranes, supple neck  Cardiovascular:  Nl S1 and S2, RRR  without murmur, gallop or rub. no edema Respiratory:  Good breath sounds bilaterally, CTAB with normal effort, no rales     Diabetic education: ongoing education regarding chronic disease management for diabetes was given today. We continue to reinforce the ABC's of diabetic management: A1c (<7 or 8 dependent upon patient), tight blood pressure control, and cholesterol management with goal LDL < 100 minimally. We discuss diet strategies, exercise recommendations, medication options and possible side effects. At each visit, we review recommended immunizations and preventive care recommendations for diabetics and stress that good diabetic control can prevent other problems. See below for this patient's data.   Commons side  effects, risks, benefits, and alternatives for medications and treatment plan prescribed today were discussed, and the patient expressed understanding of the given instructions. Patient is instructed to call or message via MyChart if he/she has any questions or concerns regarding our treatment plan. No barriers to understanding were identified. We discussed Red Flag symptoms and signs in detail. Patient expressed understanding regarding what to do in case of urgent or emergency type symptoms.  Medication list was reconciled, printed and provided to the patient in AVS. Patient instructions and summary information was reviewed with the patient as documented in the AVS. This note was prepared with assistance of Dragon voice recognition software. Occasional wrong-word or sound-a-like substitutions may have occurred due to the inherent limitations of voice recognition software  This visit occurred during the SARS-CoV-2 public health emergency.  Safety protocols were in place, including screening questions prior to the visit, additional usage of staff PPE, and extensive cleaning of exam room while observing appropriate contact time as indicated for disinfecting solutions.

## 2021-05-18 NOTE — Telephone Encounter (Signed)
Patient states that Sabrina Aguirre was supposed to be sent in as well. Wanting it sent in today as Dr.Andy told her that she was going to do this at the appointment but never did.

## 2021-05-18 NOTE — Telephone Encounter (Signed)
Please advise 

## 2021-05-18 NOTE — Telephone Encounter (Signed)
MEDICATION: Flonase  PHARMACY:  [facility] Phone:  [phone]  Fax:  [phone]      Comments: Patient stated that Dr. Jonni Sanger was going to send this in for her.   **Let patient know to contact pharmacy at the end of the day to make sure medication is ready. **  ** Please notify patient to allow 48-72 hours to process**  **Encourage patient to contact the pharmacy for refills or they can request refills through [facility]**

## 2021-05-18 NOTE — Patient Instructions (Addendum)
Please return in 3 months for diabetes follow up   We will call you with your lab results.  Please start the metformin to help control your diabetes.   Please set up an appointment for a diabetic eye exam and have the results sent to me. It is now due.   If you have any questions or concerns, please don't hesitate to send me a message via MyChart or call the office at [phone]. Thank you for visiting with [place] today! It's our pleasure caring for you.

## 2021-05-18 NOTE — Telephone Encounter (Signed)
Patient stated that Dr. Jonni Sanger was going to refill this for her.

## 2021-05-19 ENCOUNTER — Other Ambulatory Visit: Payer: Self-pay

## 2021-05-19 MED ORDER — METOPROLOL SUCCINATE ER 50 MG PO TB24: 50.0000 mg | ORAL_TABLET | Freq: Every day | ORAL | 3 refills | Status: DC

## 2021-05-19 MED ORDER — POTASSIUM CHLORIDE CRYS ER 20 MEQ PO TBCR: 20.0000 meq | EXTENDED_RELEASE_TABLET | Freq: Every day | ORAL | 0 refills | Status: DC

## 2021-05-19 MED ORDER — TELMISARTAN-HCTZ 80-12.5 MG PO TABS: 1.0000 | ORAL_TABLET | Freq: Every day | ORAL | 3 refills | Status: DC

## 2021-05-19 NOTE — Telephone Encounter (Signed)
Spoke with patient, gave a verbal understanding °

## 2021-05-19 NOTE — Telephone Encounter (Signed)
Made pt aware that medications were sent on 9/28. She was also transferred to the front desk to schedule upcoming appointment she says she does not need.

## 2021-05-19 NOTE — Telephone Encounter (Signed)
I refilled her cough meds and flonase yesterday. Please make sure she knows. thanks

## 2021-05-20 ENCOUNTER — Ambulatory Visit: Payer: Medicare PPO

## 2021-05-27 ENCOUNTER — Other Ambulatory Visit: Payer: Self-pay | Admitting: Family Medicine

## 2021-05-27 DIAGNOSIS — I10 Essential (primary) hypertension: Secondary | ICD-10-CM | POA: Diagnosis not present

## 2021-05-27 LAB — BASIC METABOLIC PANEL
BUN/Creatinine Ratio: 14 (ref 12–28)
BUN: 11 mg/dL (ref 8–27)
CO2: 24 mmol/L (ref 20–29)
Calcium: 10.5 mg/dL — ABNORMAL HIGH (ref 8.7–10.3)
Chloride: 95 mmol/L — ABNORMAL LOW (ref 96–106)
Creatinine, Ser: 0.78 mg/dL (ref 0.57–1.00)
Glucose: 123 mg/dL — ABNORMAL HIGH (ref 70–99)
Potassium: 4.7 mmol/L (ref 3.5–5.2)
Sodium: 135 mmol/L (ref 134–144)
eGFR: 76 mL/min/{1.73_m2} (ref >59)

## 2021-05-30 ENCOUNTER — Other Ambulatory Visit: Payer: Self-pay

## 2021-05-30 ENCOUNTER — Telehealth: Payer: Self-pay

## 2021-05-30 ENCOUNTER — Encounter: Payer: Self-pay | Admitting: *Deleted

## 2021-05-30 MED ORDER — SUMATRIPTAN SUCCINATE 100 MG PO TABS
ORAL_TABLET | ORAL | 0 refills | Status: DC
Start: 2021-05-30 — End: 2022-09-25

## 2021-05-30 NOTE — Telephone Encounter (Signed)
Medication has been sent in 

## 2021-05-30 NOTE — Telephone Encounter (Signed)
LAST APPOINTMENT DATE:  05/18/21  NEXT APPOINTMENT DATE: 08/24/21  MEDICATION:SUMAtriptan (IMITREX) 100 MG tablet  PHARMACY:  [facility] [place].

## 2021-06-02 ENCOUNTER — Ambulatory Visit: Payer: Medicare PPO

## 2021-06-02 ENCOUNTER — Other Ambulatory Visit: Payer: Self-pay

## 2021-06-02 ENCOUNTER — Ambulatory Visit
Admission: RE | Admit: 2021-06-02 | Discharge: 2021-06-02 | Disposition: A | Discharge status: Home / Self Care | Payer: Medicare PPO | Source: Ambulatory Visit | Attending: Family Medicine | Admitting: Family Medicine

## 2021-06-02 DIAGNOSIS — Z1231 Encounter for screening mammogram for malignant neoplasm of breast: Secondary | ICD-10-CM

## 2021-06-06 ENCOUNTER — Other Ambulatory Visit: Payer: Self-pay

## 2021-06-06 ENCOUNTER — Ambulatory Visit: Payer: Medicare PPO

## 2021-06-06 ENCOUNTER — Ambulatory Visit (INDEPENDENT_AMBULATORY_CARE_PROVIDER_SITE_OTHER): Payer: Medicare PPO

## 2021-06-06 ENCOUNTER — Telehealth: Payer: Self-pay

## 2021-06-06 DIAGNOSIS — Z0000 Encounter for general adult medical examination without abnormal findings: Secondary | ICD-10-CM | POA: Diagnosis not present
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
## 2021-06-06 NOTE — Patient Instructions (Signed)
Sabrina Aguirre , Thank you for taking time to come for your Medicare Wellness Visit. I appreciate your ongoing commitment to your health goals. Please review the following plan we discussed and let me know if I can assist you in the future.   Screening recommendations/referrals: Colonoscopy: No longer required  Mammogram: Done 05/2021 repeat every year  Bone Density: Done 04/07/20 repeat every 2 years  Recommended yearly ophthalmology/optometry visit for glaucoma screening and checkup Recommended yearly dental visit for hygiene and checkup  Vaccinations: Influenza vaccine: Done 05/18/21 Pneumococcal vaccine: Up to date Tdap vaccine: Done 11/13/11 repeat every 10 years  Shingles vaccine: Done 1/12 & 01/28/18   Covid-19:completed 1/21, 2/11, 05/17/20 & 11/21/20 & 05/25/21  Advanced directives: Advance directive discussed with you today. I have provided a copy for you to complete at home and have notarized. Once this is complete please bring a copy in to our office so we can scan it into your chart.  Conditions/risks identified: Stay healthy and live long   Next appointment: Follow up in one year for your annual wellness visit    Preventive Care 65 Years and Older, Female Preventive care refers to lifestyle choices and visits with your health care provider that can promote health and wellness. What does preventive care include? A yearly physical exam. This is also called an annual well check. Dental exams once or twice a year. Routine eye exams. Ask your health care provider how often you should have your eyes checked. Personal lifestyle choices, including: Daily care of your teeth and gums. Regular physical activity. Eating a healthy diet. Avoiding tobacco and drug use. Limiting alcohol use. Practicing safe sex. Taking low-dose aspirin every day. Taking vitamin and mineral supplements as recommended by your health care provider. What happens during an annual well check? The services and  screenings done by your health care provider during your annual well check will depend on your age, overall health, lifestyle risk factors, and family history of disease. Counseling  Your health care provider may ask you questions about your: Alcohol use. Tobacco use. Drug use. Emotional well-being. Home and relationship well-being. Sexual activity. Eating habits. History of falls. Memory and ability to understand (cognition). Work and work Statistician. Reproductive health. Screening  You may have the following tests or measurements: Height, weight, and BMI. Blood pressure. Lipid and cholesterol levels. These may be checked every 5 years, or more frequently if you are over 63 years old. Skin check. Lung cancer screening. You may have this screening every year starting at age 44 if you have a 30-pack-year history of smoking and currently smoke or have quit within the past 15 years. Fecal occult blood test (FOBT) of the stool. You may have this test every year starting at age 62. Flexible sigmoidoscopy or colonoscopy. You may have a sigmoidoscopy every 5 years or a colonoscopy every 10 years starting at age 11. Hepatitis C blood test. Hepatitis B blood test. Sexually transmitted disease (STD) testing. Diabetes screening. This is done by checking your blood sugar (glucose) after you have not eaten for a while (fasting). You may have this done every 1-3 years. Bone density scan. This is done to screen for osteoporosis. You may have this done starting at age 87. Mammogram. This may be done every 1-2 years. Talk to your health care provider about how often you should have regular mammograms. Talk with your health care provider about your test results, treatment options, and if necessary, the need for more tests. Vaccines  Your  health care provider may recommend certain vaccines, such as: Influenza vaccine. This is recommended every year. Tetanus, diphtheria, and acellular pertussis (Tdap,  Td) vaccine. You may need a Td booster every 10 years. Zoster vaccine. You may need this after age 37. Pneumococcal 13-valent conjugate (PCV13) vaccine. One dose is recommended after age 66. Pneumococcal polysaccharide (PPSV23) vaccine. One dose is recommended after age 29. Talk to your health care provider about which screenings and vaccines you need and how often you need them. This information is not intended to replace advice given to you by your health care provider. Make sure you discuss any questions you have with your health care provider. Document Released: 09/03/2015 Document Revised: 04/26/2016 Document Reviewed: 06/08/2015 Elsevier Interactive Patient Education  2017 [employer] Prevention in the Home Falls can cause injuries. They can happen to people of all ages. There are many things you can do to make your home safe and to help prevent falls. What can I do on the outside of my home? Regularly fix the edges of walkways and driveways and fix any cracks. Remove anything that might make you trip as you walk through a door, such as a raised step or threshold. Trim any bushes or trees on the path to your home. Use bright outdoor lighting. Clear any walking paths of anything that might make someone trip, such as rocks or tools. Regularly check to see if handrails are loose or broken. Make sure that both sides of any steps have handrails. Any raised decks and porches should have guardrails on the edges. Have any leaves, snow, or ice cleared regularly. Use sand or salt on walking paths during winter. Clean up any spills in your garage right away. This includes oil or grease spills. What can I do in the bathroom? Use night lights. Install grab bars by the toilet and in the tub and shower. Do not use towel bars as grab bars. Use non-skid mats or decals in the tub or shower. If you need to sit down in the shower, use a plastic, non-slip stool. Keep the floor dry. Clean up any  water that spills on the floor as soon as it happens. Remove soap buildup in the tub or shower regularly. Attach bath mats securely with double-sided non-slip rug tape. Do not have throw rugs and other things on the floor that can make you trip. What can I do in the bedroom? Use night lights. Make sure that you have a light by your bed that is easy to reach. Do not use any sheets or blankets that are too big for your bed. They should not hang down onto the floor. Have a firm chair that has side arms. You can use this for support while you get dressed. Do not have throw rugs and other things on the floor that can make you trip. What can I do in the kitchen? Clean up any spills right away. Avoid walking on wet floors. Keep items that you use a lot in easy-to-reach places. If you need to reach something above you, use a strong step stool that has a grab bar. Keep electrical cords out of the way. Do not use floor polish or wax that makes floors slippery. If you must use wax, use non-skid floor wax. Do not have throw rugs and other things on the floor that can make you trip. What can I do with my stairs? Do not leave any items on the stairs. Make sure that there are handrails  on both sides of the stairs and use them. Fix handrails that are broken or loose. Make sure that handrails are as long as the stairways. Check any carpeting to make sure that it is firmly attached to the stairs. Fix any carpet that is loose or worn. Avoid having throw rugs at the top or bottom of the stairs. If you do have throw rugs, attach them to the floor with carpet tape. Make sure that you have a light switch at the top of the stairs and the bottom of the stairs. If you do not have them, ask someone to add them for you. What else can I do to help prevent falls? Wear shoes that: Do not have high heels. Have rubber bottoms. Are comfortable and fit you well. Are closed at the toe. Do not wear sandals. If you use a  stepladder: Make sure that it is fully opened. Do not climb a closed stepladder. Make sure that both sides of the stepladder are locked into place. Ask someone to hold it for you, if possible. Clearly mark and make sure that you can see: Any grab bars or handrails. First and last steps. Where the edge of each step is. Use tools that help you move around (mobility aids) if they are needed. These include: Canes. Walkers. Scooters. Crutches. Turn on the lights when you go into a dark area. Replace any light bulbs as soon as they burn out. Set up your furniture so you have a clear path. Avoid moving your furniture around. If any of your floors are uneven, fix them. If there are any pets around you, be aware of where they are. Review your medicines with your doctor. Some medicines can make you feel dizzy. This can increase your chance of falling. Ask your doctor what other things that you can do to help prevent falls. This information is not intended to replace advice given to you by your health care provider. Make sure you discuss any questions you have with your health care provider. Document Released: 06/03/2009 Document Revised: 01/13/2016 Document Reviewed: 09/11/2014 Elsevier Interactive Patient Education  2017 [employer].

## 2021-06-06 NOTE — Patient Instructions (Signed)
[name] , Thank you for taking time to come for your Medicare Wellness Visit. I appreciate your ongoing commitment to your health goals. Please review the following plan we discussed and let me know if I can assist you in the future.   Screening recommendations/referrals: Colonoscopy: no longer required  Mammogram: Pt states 04/2021 repeat every year  Bone Density: Done 04/07/20 repeat every 2 years  Recommended yearly ophthalmology/optometry visit for glaucoma screening and checkup Recommended yearly dental visit for hygiene and checkup  Vaccinations: Influenza vaccine: Done 05/18/21 repeat every year  Pneumococcal vaccine: Up to date Tdap vaccine: Done 11/13/11 repeat every 10 years  Shingles vaccine: Done 1/12 & 01/28/18   Covid-19:Completed 1/21, 2/11, 05/17/20 & 11/21/20 & 05/25/21  Advanced directives: Advance directive discussed with you today. I have provided a copy for you to complete at home and have notarized. Once this is complete please bring a copy in to our office so we can scan it into your chart.  Conditions/risks identified: stay healthy and live long   Next appointment: Follow up in one year for your annual wellness visit    Preventive Care 65 Years and Older, Female Preventive care refers to lifestyle choices and visits with your health care provider that can promote health and wellness. What does preventive care include? A yearly physical exam. This is also called an annual well check. Dental exams once or twice a year. Routine eye exams. Ask your health care provider how often you should have your eyes checked. Personal lifestyle choices, including: Daily care of your teeth and gums. Regular physical activity. Eating a healthy diet. Avoiding tobacco and drug use. Limiting alcohol use. Practicing safe sex. Taking low-dose aspirin every day. Taking vitamin and mineral supplements as recommended by your health care provider. What happens during an annual well  check? The services and screenings done by your health care provider during your annual well check will depend on your age, overall health, lifestyle risk factors, and family history of disease. Counseling  Your health care provider may ask you questions about your: Alcohol use. Tobacco use. Drug use. Emotional well-being. Home and relationship well-being. Sexual activity. Eating habits. History of falls. Memory and ability to understand (cognition). Work and work Statistician. Reproductive health. Screening  You may have the following tests or measurements: Height, weight, and BMI. Blood pressure. Lipid and cholesterol levels. These may be checked every 5 years, or more frequently if you are over 90 years old. Skin check. Lung cancer screening. You may have this screening every year starting at age 77 if you have a 30-pack-year history of smoking and currently smoke or have quit within the past 15 years. Fecal occult blood test (FOBT) of the stool. You may have this test every year starting at age 51. Flexible sigmoidoscopy or colonoscopy. You may have a sigmoidoscopy every 5 years or a colonoscopy every 10 years starting at age 66. Hepatitis C blood test. Hepatitis B blood test. Sexually transmitted disease (STD) testing. Diabetes screening. This is done by checking your blood sugar (glucose) after you have not eaten for a while (fasting). You may have this done every 1-3 years. Bone density scan. This is done to screen for osteoporosis. You may have this done starting at age 4. Mammogram. This may be done every 1-2 years. Talk to your health care provider about how often you should have regular mammograms. Talk with your health care provider about your test results, treatment options, and if necessary, the need for  more tests. Vaccines  Your health care provider may recommend certain vaccines, such as: Influenza vaccine. This is recommended every year. Tetanus, diphtheria, and  acellular pertussis (Tdap, Td) vaccine. You may need a Td booster every 10 years. Zoster vaccine. You may need this after age 36. Pneumococcal 13-valent conjugate (PCV13) vaccine. One dose is recommended after age 25. Pneumococcal polysaccharide (PPSV23) vaccine. One dose is recommended after age 62. Talk to your health care provider about which screenings and vaccines you need and how often you need them. This information is not intended to replace advice given to you by your health care provider. Make sure you discuss any questions you have with your health care provider. Document Released: 09/03/2015 Document Revised: 04/26/2016 Document Reviewed: 06/08/2015 Elsevier Interactive Patient Education  2017 [place] Prevention in the Home Falls can cause injuries. They can happen to people of all ages. There are many things you can do to make your home safe and to help prevent falls. What can I do on the outside of my home? Regularly fix the edges of walkways and driveways and fix any cracks. Remove anything that might make you trip as you walk through a door, such as a raised step or threshold. Trim any bushes or trees on the path to your home. Use bright outdoor lighting. Clear any walking paths of anything that might make someone trip, such as rocks or tools. Regularly check to see if handrails are loose or broken. Make sure that both sides of any steps have handrails. Any raised decks and porches should have guardrails on the edges. Have any leaves, snow, or ice cleared regularly. Use sand or salt on walking paths during winter. Clean up any spills in your garage right away. This includes oil or grease spills. What can I do in the bathroom? Use night lights. Install grab bars by the toilet and in the tub and shower. Do not use towel bars as grab bars. Use non-skid mats or decals in the tub or shower. If you need to sit down in the shower, use a plastic, non-slip stool. Keep  the floor dry. Clean up any water that spills on the floor as soon as it happens. Remove soap buildup in the tub or shower regularly. Attach bath mats securely with double-sided non-slip rug tape. Do not have throw rugs and other things on the floor that can make you trip. What can I do in the bedroom? Use night lights. Make sure that you have a light by your bed that is easy to reach. Do not use any sheets or blankets that are too big for your bed. They should not hang down onto the floor. Have a firm chair that has side arms. You can use this for support while you get dressed. Do not have throw rugs and other things on the floor that can make you trip. What can I do in the kitchen? Clean up any spills right away. Avoid walking on wet floors. Keep items that you use a lot in easy-to-reach places. If you need to reach something above you, use a strong step stool that has a grab bar. Keep electrical cords out of the way. Do not use floor polish or wax that makes floors slippery. If you must use wax, use non-skid floor wax. Do not have throw rugs and other things on the floor that can make you trip. What can I do with my stairs? Do not leave any items on the stairs. Make  sure that there are handrails on both sides of the stairs and use them. Fix handrails that are broken or loose. Make sure that handrails are as long as the stairways. Check any carpeting to make sure that it is firmly attached to the stairs. Fix any carpet that is loose or worn. Avoid having throw rugs at the top or bottom of the stairs. If you do have throw rugs, attach them to the floor with carpet tape. Make sure that you have a light switch at the top of the stairs and the bottom of the stairs. If you do not have them, ask someone to add them for you. What else can I do to help prevent falls? Wear shoes that: Do not have high heels. Have rubber bottoms. Are comfortable and fit you well. Are closed at the toe. Do not  wear sandals. If you use a stepladder: Make sure that it is fully opened. Do not climb a closed stepladder. Make sure that both sides of the stepladder are locked into place. Ask someone to hold it for you, if possible. Clearly mark and make sure that you can see: Any grab bars or handrails. First and last steps. Where the edge of each step is. Use tools that help you move around (mobility aids) if they are needed. These include: Canes. Walkers. Scooters. Crutches. Turn on the lights when you go into a dark area. Replace any light bulbs as soon as they burn out. Set up your furniture so you have a clear path. Avoid moving your furniture around. If any of your floors are uneven, fix them. If there are any pets around you, be aware of where they are. Review your medicines with your doctor. Some medicines can make you feel dizzy. This can increase your chance of falling. Ask your doctor what other things that you can do to help prevent falls. This information is not intended to replace advice given to you by your health care provider. Make sure you discuss any questions you have with your health care provider. Document Released: 06/03/2009 Document Revised: 01/13/2016 Document Reviewed: 09/11/2014 Elsevier Interactive Patient Education  2017 [employer].
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
## 2021-06-07 ENCOUNTER — Other Ambulatory Visit: Payer: Self-pay

## 2021-06-07 MED ORDER — BLOOD GLUCOSE METER KIT: PACK | 0 refills | Status: DC

## 2021-06-07 NOTE — Telephone Encounter (Signed)
This has been ordered 

## 2021-06-22 DIAGNOSIS — H40013 Open angle with borderline findings, low risk, bilateral: Secondary | ICD-10-CM | POA: Diagnosis not present

## 2021-06-22 DIAGNOSIS — E119 Type 2 diabetes mellitus without complications: Secondary | ICD-10-CM | POA: Diagnosis not present

## 2021-06-22 DIAGNOSIS — H26493 Other secondary cataract, bilateral: Secondary | ICD-10-CM | POA: Diagnosis not present

## 2021-06-22 DIAGNOSIS — H43812 Vitreous degeneration, left eye: Secondary | ICD-10-CM | POA: Diagnosis not present

## 2021-06-22 DIAGNOSIS — H02422 Myogenic ptosis of left eyelid: Secondary | ICD-10-CM | POA: Diagnosis not present

## 2021-06-22 DIAGNOSIS — H16223 Keratoconjunctivitis sicca, not specified as Sjogren's, bilateral: Secondary | ICD-10-CM | POA: Diagnosis not present

## 2021-06-22 DIAGNOSIS — Z961 Presence of intraocular lens: Secondary | ICD-10-CM | POA: Diagnosis not present

## 2021-07-20 ENCOUNTER — Telehealth: Payer: Self-pay | Admitting: Cardiovascular Disease

## 2021-07-20 NOTE — Telephone Encounter (Signed)
Called patient, she states that she is having pain in her right arm. It will stop but will come back. She does mention having shoulder blade pain on the left side that also came and went away. She denies shortness of breath, and chest pains. She has no redness or raised area on her arm, and the pain is the same when she is moving it or keeping it still. She states the pain is in her elbow up to her shoulder but can shoot down to her wrist. When asked about her blood pressure she states she does not check it everyday and the last time she checked it it was good but that was a few weeks ago. She requested an appointment to be seen tomorrow with Dr.C, but I advised I did not see anything open, she then requested to have an MRI completed because she feels that something is wrong with her arm, and she was always told that if something is wrong with arms it was heart related. I did continue to speak with patient but she requested this all be sent to Dr.C for him to decide what to do. She was unhappy about not having an appointment open. I offered APP visit, but she is requesting MD.

## 2021-07-20 NOTE — Telephone Encounter (Signed)
Patient has a nagging  pain in her R arm and shoulder that started about 3:30 pm yesterday. The pain is moving to her collar bone on the left side by her neck. She is not having any other symptoms. She wants to come in and be seen by Dr. C to be checked o

## 2021-07-22 NOTE — Telephone Encounter (Signed)
Called patient, advised of message from MD.  Patient verbalized understanding.   

## 2021-08-10 ENCOUNTER — Other Ambulatory Visit: Payer: Self-pay | Admitting: Family Medicine

## 2021-08-19 ENCOUNTER — Telehealth: Payer: Self-pay

## 2021-08-19 NOTE — Telephone Encounter (Signed)
noted 

## 2021-08-19 NOTE — Telephone Encounter (Signed)
Patient called in this morning stating she had sinus pressure and pain in her face.  States she needed an antibiotic.  Did not want an appointment to see a provider.  Wanted Dr. Jonni Sanger to call an antibiotic in.  Patient is unable to complete a virtual visit.  I have advised patient to seek treatment at [facility] today due to no appointments available today at any of our clinics and Dr. Jonni Sanger being out today.

## 2021-08-23 ENCOUNTER — Other Ambulatory Visit: Payer: Self-pay

## 2021-08-23 ENCOUNTER — Ambulatory Visit (INDEPENDENT_AMBULATORY_CARE_PROVIDER_SITE_OTHER): Payer: Medicare PPO | Admitting: Family Medicine

## 2021-08-23 ENCOUNTER — Encounter: Payer: Self-pay | Admitting: Family Medicine

## 2021-08-23 DIAGNOSIS — U071 COVID-19: Secondary | ICD-10-CM | POA: Diagnosis not present

## 2021-08-23 DIAGNOSIS — J0100 Acute maxillary sinusitis, unspecified: Secondary | ICD-10-CM

## 2021-08-23 MED ORDER — BENZONATATE 200 MG PO CAPS: 200.0000 mg | ORAL_CAPSULE | Freq: Two times a day (BID) | ORAL | 0 refills | Status: DC | PRN

## 2021-08-23 MED ORDER — AZITHROMYCIN 250 MG PO TABS: ORAL_TABLET | ORAL | 0 refills | Status: DC

## 2021-08-23 NOTE — Progress Notes (Signed)
TELEPHONE ENCOUNTER   Patient verbally agreed to telephone visit and is aware that copayment and coinsurance may apply. Patient was treated using telemedicine according to accepted telemedicine protocols.  Location of the patient: home Location of provider:  [facility], [place] of all persons participating in the telemedicine service and role in the encounter: [name], MD D. Young CMA   Subjective  CC:  Chief Complaint  Patient presents with   Nasal Congestion    Present since 12/28, Covid positive 08/22/21    Generalized Body Aches   Fatigue    Present since 12/28   Facial Pain    Present since 12/28    Cough    Present since 12/28     HPI: Sabrina Aguirre is a 81 y.o. female who was telephoned today to address the problems listed above in the chief complaint. 81 year old female with hypertension and diabetes presents with 6 to 7 days history of URI symptoms including body aches nasal congestion and sinus pressure lack of appetite and nonproductive cough.  Home testing with COVID test was positive yesterday.  Daughter had same.  No dyspnea.  No pleuritic chest pain.  No nausea vomiting or diarrhea.  She is taking fluids okay.  Feels very fatigued.  Over the last 2 to 3 days she has increased sinus pain and feels she has a secondary sinus infection.  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  She requesting antibiotics and cough medications.  Her symptoms are to wait to start antivirals.  She denies symptoms of hyper glycemia.  ASSESSMENT: 1. COVID-19   2. Acute non-recurrent maxillary sinusitis     COVID: Day #6 or 7 infection.  Not feeling well but fortunately has not progressed to severe disease.  Continue supportive care.  Tessalon Perles called in.  Discussed signs symptoms mostly related to COVID infection but patient felt strongly she has a sinus infection that is bacterial.  Requesting Z-Pak.  Recommended.  The recommended.   Follow-up if not improving. Time spent with the patient (non face-to-face time during this virtual encounter): 22 minutes, spent in obtaining information about her symptoms, reviewing her previous labs, evaluations, and treatments, counseling her about her condition (please see the discussed topics above), and developing a plan to further investigate it; the patient was provided an opportunity to ask questions and all were answered. The patient agreed with the plan and demonstrated an understanding of the instructions.   The patient was advised to call back or seek an in-person evaluation if the symptoms worsen or if the condition fails to improve as anticipated.  Follow up: Diabetes follow-up visit due.  Patient to schedule Visit date not found  No orders of the defined types were placed in this encounter.  Meds ordered this encounter  Medications   benzonatate (TESSALON) 200 MG capsule    Sig: Take 1 capsule (200 mg total) by mouth 2 (two) times daily as needed for cough.    Dispense:  20 capsule    Refill:  0   azithromycin (ZITHROMAX) 250 MG tablet    Sig: Take 2 tabs today, then 1 tab daily for 4 days    Dispense:  1 each    Refill:  0     I reviewed the patients updated PMH, FH, and SocHx.    Patient Active Problem List   Diagnosis Date Noted   Type 2 diabetes mellitus without complication, without long-term current use of insulin ([place])  11/13/2011    Priority: High   Essential (primary) hypertension 02/03/2011    Priority: High   Adrenal adenoma     Priority: Medium    Osteopenia 04/04/2011    Priority: Medium    Gastro-esophageal reflux disease without esophagitis 02/03/2011    Priority: Medium    Colon polyps 02/03/2011    Priority: Medium    Migraine without status migrainosus, not intractable 02/03/2011    Priority: Medium    Primary osteoarthritis of right hand 10/12/2016    Priority: Low   Persistent cough 03/19/2015    Priority: Low   Chronic allergic  rhinitis 02/03/2011    Priority: Low   Palpitations 05/18/2021   Current Meds  Medication Sig   Ascorbic Acid (VITAMIN C) 1000 MG tablet Take 1,000 mg by mouth daily.   aspirin 81 MG tablet Take 81 mg by mouth daily.   azithromycin (ZITHROMAX) 250 MG tablet Take 2 tabs today, then 1 tab daily for 4 days   benzonatate (TESSALON) 200 MG capsule Take 1 capsule (200 mg total) by mouth 2 (two) times daily as needed for cough.   blood glucose meter kit and supplies Dispense based on patient and insurance preference. Use up to two times daily as directed. (FOR ICD-10 E10.9, E11.9).   Calcium Carb-Cholecalciferol (CALCIUM 600/VITAMIN D3 PO) Take 1 tablet by mouth daily.    Cholecalciferol (VITAMIN D3) 1000 units CAPS Take 1,000 Units by mouth daily.   Cinnamon 500 MG TABS Take 1 tablet by mouth daily.   COLLAGEN PO Take 1 packet by mouth daily. power   docusate sodium (COLACE) 100 MG capsule Take 200 mg by mouth 2 (two) times daily.   fish oil-omega-3 fatty acids 1000 MG capsule Take 1 g by mouth daily.    fluticasone (FLONASE) 50 MCG/ACT nasal spray Place 1 spray into both nostrils daily.   Levocetirizine Dihydrochloride (XYZAL PO) Take by mouth.   metFORMIN (GLUCOPHAGE) 500 MG tablet Take 1 tablet (500 mg total) by mouth 2 (two) times daily with a meal.   metoprolol succinate (TOPROL-XL) 50 MG 24 hr tablet Take 1 tablet (50 mg total) by mouth daily. Take with or immediately following a meal.   Multiple Vitamin (MULTI-VITAMINS) TABS Take 1 tablet by mouth daily.   omeprazole (PRILOSEC) 20 MG capsule Take 1 capsule by mouth once daily   PSYLLIUM PO Take 1 packet by mouth 3 (three) times daily. Pt takes two times daily.   SUMAtriptan (IMITREX) 100 MG tablet TAKE 1/2 (ONE-HALF) TABLET BY MOUTH EVERY 2 HOURS AS NEEDED   telmisartan-hydrochlorothiazide (MICARDIS HCT) 80-12.5 MG tablet Take 1 tablet by mouth daily.   vitamin B-12 (CYANOCOBALAMIN) 1000 MCG tablet Take 1,000 mcg by mouth daily.    [DISCONTINUED] benzonatate (TESSALON) 100 MG capsule Take 1 capsule (100 mg total) by mouth 2 (two) times daily as needed for cough.    Allergies: Patient has No Known Allergies. Family History: Patient family history includes Diabetes in her mother; Hypertension in her mother; Stroke in her mother. Social History:  Patient  reports that she has quit smoking. Her smoking use included cigarettes. She has never used smokeless tobacco. She reports that she does not drink alcohol and does not use drugs.  Review of Systems: Constitutional: Negative for fever malaise or anorexia Cardiovascular: negative for chest pain Respiratory: negative for SOB or persistent cough Gastrointestinal: negative for abdominal pain

## 2021-08-24 ENCOUNTER — Ambulatory Visit: Payer: Medicare PPO | Admitting: Family Medicine

## 2021-09-13 ENCOUNTER — Other Ambulatory Visit: Payer: Self-pay

## 2021-09-13 ENCOUNTER — Encounter: Payer: Self-pay | Admitting: Family Medicine

## 2021-09-13 ENCOUNTER — Ambulatory Visit: Payer: Medicare PPO | Admitting: Family Medicine

## 2021-09-13 VITALS — BP 134/72 | HR 88 | Temp 98.1°F | Ht 64.0 in | Wt 179.2 lb

## 2021-09-13 DIAGNOSIS — E119 Type 2 diabetes mellitus without complications: Secondary | ICD-10-CM | POA: Diagnosis not present

## 2021-09-13 DIAGNOSIS — I152 Hypertension secondary to endocrine disorders: Secondary | ICD-10-CM

## 2021-09-13 DIAGNOSIS — E1159 Type 2 diabetes mellitus with other circulatory complications: Secondary | ICD-10-CM

## 2021-09-13 DIAGNOSIS — Z8616 Personal history of COVID-19: Secondary | ICD-10-CM

## 2021-09-13 DIAGNOSIS — R002 Palpitations: Secondary | ICD-10-CM | POA: Diagnosis not present

## 2021-09-13 DIAGNOSIS — E876 Hypokalemia: Secondary | ICD-10-CM

## 2021-09-13 LAB — BASIC METABOLIC PANEL
BUN: 10 mg/dL (ref 6–23)
CO2: 29 mEq/L (ref 19–32)
Calcium: 10.2 mg/dL (ref 8.4–10.5)
Chloride: 96 mEq/L (ref 96–112)
Creatinine, Ser: 0.75 mg/dL (ref 0.40–1.20)
GFR: 74.38 mL/min (ref >60.00)
Glucose, Bld: 115 mg/dL — ABNORMAL HIGH (ref 70–99)
Potassium: 4 mEq/L (ref 3.5–5.1)
Sodium: 135 mEq/L (ref 135–145)

## 2021-09-13 LAB — MAGNESIUM: Magnesium: 1.6 mg/dL (ref 1.5–2.5)

## 2021-09-13 LAB — POCT GLYCOSYLATED HEMOGLOBIN (HGB A1C): Hemoglobin A1C: 6 % — AB (ref 4.0–5.6)

## 2021-09-13 NOTE — Patient Instructions (Signed)
Please return in June for your annual complete physical; please come fasting.   I will release your lab results to you on your MyChart account with further instructions. Please reply with any questions.    Keep monitoring your blood pressures at home and write down the numbers. If it remains borderline elevated, we an increase the dose of the Toprol XL.   Please set up an appointment for a diabetic eye exam and have the results sent to me.   If you have any questions or concerns, please don't hesitate to send me a message via MyChart or call the office at [phone]. Thank you for visiting with [place] today! It's our pleasure caring for you.

## 2021-09-13 NOTE — Progress Notes (Signed)
Subjective  CC:  Chief Complaint  Patient presents with   Diabetes   Hypertension   Hyperlipidemia    HPI: Sabrina Aguirre is a 81 y.o. female who presents to the office today for follow up of diabetes and problems listed above in the chief complaint.  Diabetes follow up: Her diabetic control is reported as Improved. Tolerating metformin 500 bid which was restarted at last visit in October.  She denies exertional CP or SOB or symptomatic hypoglycemia. She denies foot sores or paresthesias. She is due for an eye exam.  HTN: reports home bp is better: 130s/70s. Taking toprol xl 50 and tolerating well. Has decreased palpitations as well.  HLD On statin and at goal.  Had covid earlier this month. Has recovered: still with some fatigue. Also completed a zpak for secondary sinusitis. Hypokalmia and mild hyponatremia: due to bp meds which were adjusted. Due for recheck.   Wt Readings from Last 3 Encounters:  09/13/21 179 lb 3.2 oz (81.3 kg)  05/18/21 180 lb 9.6 oz (81.9 kg)  04/27/21 180 lb 3.2 oz (81.7 kg)    BP Readings from Last 3 Encounters:  09/13/21 134/72  05/18/21 130/80  04/27/21 132/86    Assessment  1. Type 2 diabetes mellitus without complication, without long-term current use of insulin ([place])   2. Hypertension associated with diabetes ([place])   3. Palpitations   4. History of COVID-19   5. Hypokalemia      Plan  Diabetes is currently very well controlled. Continue met 500 bid and diabetic diet. Needs to schedule an eye exam.  HTN: fairly good control on toprol xl 50 and micardis. High in office today. Continue home monitoring. Has f/u with cards in a few month. Depending on home log readings and bp in cards office, will decide to adjust up toprol xl  then Palpitations are now well controlled on BB Recheck potassium, sodium and magnesium on low dose hctz.  Recovered from covid; sinus infection has cleared.   Follow up: June 2023 for cpe. Orders Placed This Encounter   Procedures   Basic metabolic panel   Magnesium   POCT HgB A1C   No orders of the defined types were placed in this encounter.     Immunization History  Administered Date(s) Administered   Fluad Quad(high Dose 65+) 04/27/2020, 05/18/2021   Influenza Split 05/06/2012   Influenza Whole 11/02/2008, 05/10/2013   Influenza, High Dose Seasonal PF 05/12/2014, 05/26/2015, 05/22/2016, 04/25/2019   PFIZER(Purple Top)SARS-COV-2 Vaccination 09/11/2019, 10/02/2019, 05/17/2020, 11/21/2020, 05/25/2021   Pneumococcal Conjugate-13 05/10/2015   Pneumococcal Polysaccharide-23 05/19/2013, 04/25/2019   Tdap 11/13/2011   Zoster Recombinat (Shingrix) 09/01/2017, 01/28/2018   Zoster, Live 05/07/2012    Diabetes Related Lab Review: Lab Results  Component Value Date   HGBA1C 6.0 (A) 09/13/2021   HGBA1C 7.0 (A) 02/02/2021   HGBA1C 6.4 (A) 10/06/2020    No results found for: [place]l [place] Lab Results  Component Value Date   CREATININE 0.78 05/27/2021   BUN 11 05/27/2021   NA 135 05/27/2021   K 4.7 05/27/2021   CL 95 (L) 05/27/2021   CO2 24 05/27/2021   Lab Results  Component Value Date   CHOL 86 02/02/2021   CHOL 94 01/23/2020   CHOL 98 07/22/2017   Lab Results  Component Value Date   HDL 51.00 02/02/2021   HDL 48.50 01/23/2020   HDL 44 07/22/2017   Lab Results  Component Value Date   LDLCALC 13 02/02/2021   [place]  23 01/23/2020   LDLCALC 28 07/22/2017   Lab Results  Component Value Date   TRIG 112.0 02/02/2021   TRIG 113.0 01/23/2020   TRIG 128 07/22/2017   Lab Results  Component Value Date   CHOLHDL 2 02/02/2021   CHOLHDL 2 01/23/2020   CHOLHDL 2.2 07/22/2017   No results found for: LDLDIRECT The ASCVD Risk score (Arnett DK, et al., 2019) failed to calculate for the following reasons:   The 2019 ASCVD risk score is only valid for ages 75 to 60 I have reviewed the [place] history. Patient Active Problem List   Diagnosis Date Noted   Type 2 diabetes  mellitus without complication, without long-term current use of insulin ([place]) 11/13/2011    Priority: High   Hypertension associated with diabetes ([place]) 02/03/2011    Priority: High   Adrenal adenoma     Priority: Medium    Osteopenia 04/04/2011    Priority: Medium     Dexa 03/2020 lowest T = - 1.7; recheck 2 years.     Gastro-esophageal reflux disease without esophagitis 02/03/2011    Priority: Medium    Colon polyps 02/03/2011    Priority: Medium    Migraine without status migrainosus, not intractable 02/03/2011    Priority: Medium    Primary osteoarthritis of right hand 10/12/2016    Priority: Low   Persistent cough 03/19/2015    Priority: Low   Chronic allergic rhinitis 02/03/2011    Priority: Low   Palpitations 05/18/2021    Social History: Patient  reports that she has quit smoking. Her smoking use included cigarettes. She has never used smokeless tobacco. She reports that she does not drink alcohol and does not use drugs.  Review of Systems: Ophthalmic: negative for eye pain, loss of vision or double vision Cardiovascular: negative for chest pain Respiratory: negative for SOB or persistent cough Gastrointestinal: negative for abdominal pain Genitourinary: negative for dysuria or gross hematuria MSK: negative for foot lesions Neurologic: negative for weakness or gait disturbance  Objective  Vitals: BP 134/72 Comment: by home readings   Pulse 88    Temp 98.1 F (36.7 C) (Temporal)    Ht _0  (1.626 m)    Wt 179 lb 3.2 oz (81.3 kg)    SpO2 97%    BMI 30.76 kg/m  General: well appearing, no acute distress  Psych:  Alert and oriented, normal mood and affect HEENT:  Normocephalic, atraumatic, moist mucous membranes, supple neck  Cardiovascular:  Nl S1 and S2, RRR without murmur, gallop or rub. no edema Respiratory:  Good breath sounds bilaterally, CTAB with normal effort, no rales  Diabetic education: ongoing education regarding chronic disease management for diabetes  was given today. We continue to reinforce the ABC's of diabetic management: A1c (<7 or 8 dependent upon patient), tight blood pressure control, and cholesterol management with goal LDL < 100 minimally. We discuss diet strategies, exercise recommendations, medication options and possible side effects. At each visit, we review recommended immunizations and preventive care recommendations for diabetics and stress that good diabetic control can prevent other problems. See below for this patient's data.   Commons side effects, risks, benefits, and alternatives for medications and treatment plan prescribed today were discussed, and the patient expressed understanding of the given instructions. Patient is instructed to call or message via MyChart if he/she has any questions or concerns regarding our treatment plan. No barriers to understanding were identified. We discussed Red Flag symptoms and signs in detail. Patient expressed  understanding regarding what to do in case of urgent or emergency type symptoms.  Medication list was reconciled, printed and provided to the patient in AVS. Patient instructions and summary information was reviewed with the patient as documented in the AVS. This note was prepared with assistance of Dragon voice recognition software. Occasional wrong-word or sound-a-like substitutions may have occurred due to the inherent limitations of voice recognition software  This visit occurred during the SARS-CoV-2 public health emergency.  Safety protocols were in place, including screening questions prior to the visit, additional usage of staff PPE, and extensive cleaning of exam room while observing appropriate contact time as indicated for disinfecting solutions.

## 2021-09-14 NOTE — Progress Notes (Signed)
Please call patient: I have reviewed his/her lab results. Her labs have normalized. She can stop the magnesium now. No other changes are needed at this time. Monitor blood pressure at home and bring log to your [facility] appt in march

## 2021-09-29 NOTE — Progress Notes (Signed)
Spoke with patient, gave verbalized understanding.

## 2021-10-27 DIAGNOSIS — K625 Hemorrhage of anus and rectum: Secondary | ICD-10-CM | POA: Diagnosis not present

## 2021-10-27 DIAGNOSIS — K5900 Constipation, unspecified: Secondary | ICD-10-CM | POA: Diagnosis not present

## 2021-10-27 DIAGNOSIS — K219 Gastro-esophageal reflux disease without esophagitis: Secondary | ICD-10-CM | POA: Diagnosis not present

## 2021-10-27 DIAGNOSIS — K439 Ventral hernia without obstruction or gangrene: Secondary | ICD-10-CM | POA: Diagnosis not present
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
## 2021-11-11 ENCOUNTER — Ambulatory Visit: Payer: Medicare PPO | Admitting: Cardiovascular Disease

## 2021-11-11 ENCOUNTER — Encounter: Payer: Self-pay | Admitting: Cardiovascular Disease

## 2021-11-11 ENCOUNTER — Other Ambulatory Visit: Payer: Self-pay

## 2021-11-11 VITALS — BP 138/82 | HR 82 | Ht 64.0 in | Wt 180.0 lb

## 2021-11-11 DIAGNOSIS — E119 Type 2 diabetes mellitus without complications: Secondary | ICD-10-CM

## 2021-11-11 DIAGNOSIS — I10 Essential (primary) hypertension: Secondary | ICD-10-CM | POA: Diagnosis not present

## 2021-11-11 DIAGNOSIS — I471 Supraventricular tachycardia: Secondary | ICD-10-CM | POA: Diagnosis not present

## 2021-11-11 NOTE — Patient Instructions (Signed)

## 2021-11-21 ENCOUNTER — Other Ambulatory Visit: Payer: Self-pay | Admitting: Family Medicine

## 2021-12-01 DIAGNOSIS — K219 Gastro-esophageal reflux disease without esophagitis: Secondary | ICD-10-CM | POA: Diagnosis not present

## 2021-12-01 DIAGNOSIS — M549 Dorsalgia, unspecified: Secondary | ICD-10-CM | POA: Diagnosis not present

## 2021-12-01 DIAGNOSIS — K5900 Constipation, unspecified: Secondary | ICD-10-CM | POA: Diagnosis not present
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
## 2021-12-14 ENCOUNTER — Ambulatory Visit (INDEPENDENT_AMBULATORY_CARE_PROVIDER_SITE_OTHER): Payer: Medicare PPO

## 2021-12-14 ENCOUNTER — Ambulatory Visit: Payer: Medicare PPO | Admitting: Rheumatology

## 2021-12-14 ENCOUNTER — Encounter: Payer: Self-pay | Admitting: Rheumatology

## 2021-12-14 VITALS — BP 167/94 | HR 83 | Ht 64.0 in | Wt 181.0 lb

## 2021-12-14 DIAGNOSIS — G8929 Other chronic pain: Secondary | ICD-10-CM

## 2021-12-14 DIAGNOSIS — R002 Palpitations: Secondary | ICD-10-CM

## 2021-12-14 DIAGNOSIS — M545 Low back pain, unspecified: Secondary | ICD-10-CM

## 2021-12-14 DIAGNOSIS — E1159 Type 2 diabetes mellitus with other circulatory complications: Secondary | ICD-10-CM

## 2021-12-14 DIAGNOSIS — M8589 Other specified disorders of bone density and structure, multiple sites: Secondary | ICD-10-CM | POA: Diagnosis not present

## 2021-12-14 DIAGNOSIS — M5459 Other low back pain: Secondary | ICD-10-CM | POA: Diagnosis not present

## 2021-12-14 DIAGNOSIS — M25561 Pain in right knee: Secondary | ICD-10-CM

## 2021-12-14 DIAGNOSIS — E119 Type 2 diabetes mellitus without complications: Secondary | ICD-10-CM | POA: Diagnosis not present

## 2021-12-14 DIAGNOSIS — G43009 Migraine without aura, not intractable, without status migrainosus: Secondary | ICD-10-CM

## 2021-12-14 DIAGNOSIS — M19042 Primary osteoarthritis, left hand: Secondary | ICD-10-CM

## 2021-12-14 DIAGNOSIS — M25562 Pain in left knee: Secondary | ICD-10-CM | POA: Diagnosis not present

## 2021-12-14 DIAGNOSIS — D3500 Benign neoplasm of unspecified adrenal gland: Secondary | ICD-10-CM

## 2021-12-14 DIAGNOSIS — M4126 Other idiopathic scoliosis, lumbar region: Secondary | ICD-10-CM | POA: Diagnosis not present

## 2021-12-14 DIAGNOSIS — M19041 Primary osteoarthritis, right hand: Secondary | ICD-10-CM | POA: Diagnosis not present

## 2021-12-14 DIAGNOSIS — J309 Allergic rhinitis, unspecified: Secondary | ICD-10-CM

## 2021-12-14 DIAGNOSIS — Z860100 Personal history of colon polyps, unspecified: Secondary | ICD-10-CM

## 2021-12-14 DIAGNOSIS — K219 Gastro-esophageal reflux disease without esophagitis: Secondary | ICD-10-CM

## 2021-12-14 DIAGNOSIS — I152 Hypertension secondary to endocrine disorders: Secondary | ICD-10-CM

## 2021-12-14 DIAGNOSIS — N29 Other disorders of kidney and ureter in diseases classified elsewhere: Secondary | ICD-10-CM

## 2021-12-14 DIAGNOSIS — D3502 Benign neoplasm of left adrenal gland: Secondary | ICD-10-CM

## 2021-12-14 NOTE — Patient Instructions (Signed)
Back Exercises ?The following exercises strengthen the muscles that help to support the trunk (torso) and back. They also help to keep the lower back flexible. Doing these exercises can help to prevent or lessen existing low back pain. ?If you have back pain or discomfort, try doing these exercises 2-3 times each day or as told by your health care provider. ?As your pain improves, do them once each day, but increase the number of times that you repeat the steps for each exercise (do more repetitions). ?To prevent the recurrence of back pain, continue to do these exercises once each day or as told by your health care provider. ?Do exercises exactly as told by your health care provider and adjust them as directed. It is normal to feel mild stretching, pulling, tightness, or discomfort as you do these exercises, but you should stop right away if you feel sudden pain or your pain gets worse. ?Exercises ?Single knee to chest ?Repeat these steps 3-5 times for each leg: ?Lie on your back on a firm bed or the floor with your legs extended. ?Bring one knee to your chest. Your other leg should stay extended and in contact with the floor. ?Hold your knee in place by grabbing your knee or thigh with both hands and hold. ?Pull on your knee until you feel a gentle stretch in your lower back or buttocks. ?Hold the stretch for 10-30 seconds. ?Slowly release and straighten your leg. ? ?Pelvic tilt ?Repeat these steps 5-10 times: ?Lie on your back on a firm bed or the floor with your legs extended. ?Bend your knees so they are pointing toward the ceiling and your feet are flat on the floor. ?Tighten your lower abdominal muscles to press your lower back against the floor. This motion will tilt your pelvis so your tailbone points up toward the ceiling instead of pointing to your feet or the floor. ?With gentle tension and even breathing, hold this position for 5-10 seconds. ? ?Cat-cow ?Repeat these steps until your lower back becomes  more flexible: ?Get into a hands-and-knees position on a firm bed or the floor. Keep your hands under your shoulders, and keep your knees under your hips. You may place padding under your knees for comfort. ?Let your head hang down toward your chest. Contract your abdominal muscles and point your tailbone toward the floor so your lower back becomes rounded like the back of a cat. ?Hold this position for 5 seconds. ?Slowly lift your head, let your abdominal muscles relax, and point your tailbone up toward the ceiling so your back forms a sagging arch like the back of a cow. ?Hold this position for 5 seconds. ? ?Press-ups ?Repeat these steps 5-10 times: ?Lie on your abdomen (face-down) on a firm bed or the floor. ?Place your palms near your head, about shoulder-width apart. ?Keeping your back as relaxed as possible and keeping your hips on the floor, slowly straighten your arms to raise the top half of your body and lift your shoulders. Do not use your back muscles to raise your upper torso. You may adjust the placement of your hands to make yourself more comfortable. ?Hold this position for 5 seconds while you keep your back relaxed. ?Slowly return to lying flat on the floor. ? ?Bridges ?Repeat these steps 10 times: ?Lie on your back on a firm bed or the floor. ?Bend your knees so they are pointing toward the ceiling and your feet are flat on the floor. Your arms should be flat  at your sides, next to your body. ?Tighten your buttocks muscles and lift your buttocks off the floor until your waist is at almost the same height as your knees. You should feel the muscles working in your buttocks and the back of your thighs. If you do not feel these muscles, slide your feet 1-2 inches (2.5-5 cm) farther away from your buttocks. ?Hold this position for 3-5 seconds. ?Slowly lower your hips to the starting position, and allow your buttocks muscles to relax completely. ?If this exercise is too easy, try doing it with your arms  crossed over your chest. ?Abdominal crunches ?Repeat these steps 5-10 times: ?Lie on your back on a firm bed or the floor with your legs extended. ?Bend your knees so they are pointing toward the ceiling and your feet are flat on the floor. ?Cross your arms over your chest. ?Tip your chin slightly toward your chest without bending your neck. ?Tighten your abdominal muscles and slowly raise your torso high enough to lift your shoulder blades a tiny bit off the floor. Avoid raising your torso higher than that because it can put too much stress on your lower back and does not help to strengthen your abdominal muscles. ?Slowly return to your starting position. ? ?Back lifts ?Repeat these steps 5-10 times: ?Lie on your abdomen (face-down) with your arms at your sides, and rest your forehead on the floor. ?Tighten the muscles in your legs and your buttocks. ?Slowly lift your chest off the floor while you keep your hips pressed to the floor. Keep the back of your head in line with the curve in your back. Your eyes should be looking at the floor. ?Hold this position for 3-5 seconds. ?Slowly return to your starting position. ? ?Contact a health care provider if: ?Your back pain or discomfort gets much worse when you do an exercise. ?Your worsening back pain or discomfort does not lessen within 2 hours after you exercise. ?If you have any of these problems, stop doing these exercises right away. Do not do them again unless your health care provider says that you can. ?Get help right away if: ?You develop sudden, severe back pain. If this happens, stop doing the exercises right away. Do not do them again unless your health care provider says that you can. ?This information is not intended to replace advice given to you by your health care provider. Make sure you discuss any questions you have with your health care provider. ?Document Revised: 02/01/2021 Document Reviewed: 10/20/2020 ?Elsevier Patient Education ? [employer]. ?Knee Exercises ?Ask your health care provider which exercises are safe for you. Do exercises exactly as told by your health care provider and adjust them as directed. It is normal to feel mild stretching, pulling, tightness, or discomfort as you do these exercises. Stop right away if you feel sudden pain or your pain gets worse. Do not begin these exercises until told by your health care provider. ?Stretching and range-of-motion exercises ?These exercises warm up your muscles and joints and improve the movement and flexibility of your knee. These exercises also help to relieve pain and swelling. ?Knee extension, prone ? ?Lie on your abdomen (prone position) on a bed. ?Place your left / right knee just beyond the edge of the surface so your knee is not on the bed. You can put a towel under your left / right thigh just above your kneecap for comfort. ?Relax your leg muscles and allow gravity to straighten your knee (  extension). You should feel a stretch behind your left / right knee. ?Hold this position for __________ seconds. ?Scoot up so your knee is supported between repetitions. ?Repeat __________ times. Complete this exercise __________ times a day. ?Knee flexion, active ? ?Lie on your back with both legs straight. If this causes back discomfort, bend your left / right knee so your foot is flat on the floor. ?Slowly slide your left / right heel back toward your buttocks. Stop when you feel a gentle stretch in the front of your knee or thigh (flexion). ?Hold this position for __________ seconds. ?Slowly slide your left / right heel back to the starting position. ?Repeat __________ times. Complete this exercise __________ times a day. ?Quadriceps stretch, prone ? ?Lie on your abdomen on a firm surface, such as a bed or padded floor. ?Bend your left / right knee and hold your ankle. If you cannot reach your ankle or pant leg, loop a belt around your foot and grab the belt instead. ?Gently pull your heel toward  your buttocks. Your knee should not slide out to the side. You should feel a stretch in the front of your thigh and knee (quadriceps). ?Hold this position for __________ seconds. ?Repeat __________ ti

## 2021-12-28 ENCOUNTER — Ambulatory Visit: Payer: Medicare PPO | Attending: Rheumatology | Admitting: Physical Therapy

## 2021-12-28 ENCOUNTER — Encounter: Payer: Self-pay | Admitting: Physical Therapy

## 2021-12-28 DIAGNOSIS — M545 Low back pain, unspecified: Secondary | ICD-10-CM | POA: Insufficient documentation

## 2021-12-28 DIAGNOSIS — R262 Difficulty in walking, not elsewhere classified: Secondary | ICD-10-CM | POA: Diagnosis not present

## 2021-12-28 DIAGNOSIS — R2681 Unsteadiness on feet: Secondary | ICD-10-CM | POA: Insufficient documentation

## 2021-12-28 DIAGNOSIS — R278 Other lack of coordination: Secondary | ICD-10-CM | POA: Diagnosis not present

## 2021-12-28 DIAGNOSIS — G8929 Other chronic pain: Secondary | ICD-10-CM | POA: Insufficient documentation

## 2021-12-28 DIAGNOSIS — R293 Abnormal posture: Secondary | ICD-10-CM | POA: Insufficient documentation

## 2021-12-28 DIAGNOSIS — M6281 Muscle weakness (generalized): Secondary | ICD-10-CM | POA: Diagnosis not present
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
## 2022-01-04 ENCOUNTER — Ambulatory Visit: Payer: Medicare PPO | Admitting: Physical Therapy

## 2022-01-04 ENCOUNTER — Encounter: Payer: Self-pay | Admitting: Physical Therapy

## 2022-01-04 DIAGNOSIS — R262 Difficulty in walking, not elsewhere classified: Secondary | ICD-10-CM

## 2022-01-04 DIAGNOSIS — R2681 Unsteadiness on feet: Secondary | ICD-10-CM | POA: Diagnosis not present

## 2022-01-04 DIAGNOSIS — G8929 Other chronic pain: Secondary | ICD-10-CM | POA: Diagnosis not present

## 2022-01-04 DIAGNOSIS — R278 Other lack of coordination: Secondary | ICD-10-CM | POA: Diagnosis not present

## 2022-01-04 DIAGNOSIS — R293 Abnormal posture: Secondary | ICD-10-CM | POA: Diagnosis not present

## 2022-01-04 DIAGNOSIS — M6281 Muscle weakness (generalized): Secondary | ICD-10-CM | POA: Diagnosis not present

## 2022-01-04 DIAGNOSIS — M545 Low back pain, unspecified: Secondary | ICD-10-CM | POA: Diagnosis not present
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
## 2022-01-11 ENCOUNTER — Ambulatory Visit: Payer: Medicare PPO | Admitting: Physical Therapy

## 2022-01-18 ENCOUNTER — Encounter: Payer: Self-pay | Admitting: Physical Therapy

## 2022-01-18 ENCOUNTER — Ambulatory Visit: Payer: Medicare PPO | Admitting: Physical Therapy

## 2022-01-18 DIAGNOSIS — M6281 Muscle weakness (generalized): Secondary | ICD-10-CM

## 2022-01-18 DIAGNOSIS — R2681 Unsteadiness on feet: Secondary | ICD-10-CM

## 2022-01-18 DIAGNOSIS — R293 Abnormal posture: Secondary | ICD-10-CM | POA: Diagnosis not present

## 2022-01-18 DIAGNOSIS — G8929 Other chronic pain: Secondary | ICD-10-CM | POA: Diagnosis not present

## 2022-01-18 DIAGNOSIS — R278 Other lack of coordination: Secondary | ICD-10-CM | POA: Diagnosis not present

## 2022-01-18 DIAGNOSIS — R262 Difficulty in walking, not elsewhere classified: Secondary | ICD-10-CM | POA: Diagnosis not present

## 2022-01-18 DIAGNOSIS — M545 Low back pain, unspecified: Secondary | ICD-10-CM | POA: Diagnosis not present
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
## 2022-01-25 ENCOUNTER — Ambulatory Visit: Payer: Medicare PPO | Attending: Rheumatology | Admitting: Physical Therapy

## 2022-01-25 ENCOUNTER — Encounter: Payer: Self-pay | Admitting: Physical Therapy

## 2022-01-25 DIAGNOSIS — M6281 Muscle weakness (generalized): Secondary | ICD-10-CM | POA: Diagnosis not present

## 2022-01-25 DIAGNOSIS — R2681 Unsteadiness on feet: Secondary | ICD-10-CM | POA: Insufficient documentation

## 2022-01-25 DIAGNOSIS — R293 Abnormal posture: Secondary | ICD-10-CM | POA: Insufficient documentation

## 2022-01-25 DIAGNOSIS — R262 Difficulty in walking, not elsewhere classified: Secondary | ICD-10-CM | POA: Diagnosis not present

## 2022-01-25 DIAGNOSIS — R278 Other lack of coordination: Secondary | ICD-10-CM | POA: Insufficient documentation
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
## 2022-02-01 ENCOUNTER — Encounter: Payer: Self-pay | Admitting: Physical Therapy

## 2022-02-01 ENCOUNTER — Ambulatory Visit: Payer: Medicare PPO | Admitting: Physical Therapy

## 2022-02-01 DIAGNOSIS — M6281 Muscle weakness (generalized): Secondary | ICD-10-CM | POA: Diagnosis not present

## 2022-02-01 DIAGNOSIS — R293 Abnormal posture: Secondary | ICD-10-CM

## 2022-02-01 DIAGNOSIS — R2681 Unsteadiness on feet: Secondary | ICD-10-CM | POA: Diagnosis not present

## 2022-02-01 DIAGNOSIS — R278 Other lack of coordination: Secondary | ICD-10-CM | POA: Diagnosis not present

## 2022-02-01 DIAGNOSIS — R262 Difficulty in walking, not elsewhere classified: Secondary | ICD-10-CM | POA: Diagnosis not present
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
## 2022-02-08 ENCOUNTER — Encounter: Payer: Medicare PPO | Admitting: Family Medicine

## 2022-02-08 ENCOUNTER — Ambulatory Visit: Payer: Medicare PPO | Admitting: Physical Therapy

## 2022-02-08 ENCOUNTER — Encounter: Payer: Self-pay | Admitting: Physical Therapy

## 2022-02-08 DIAGNOSIS — R2681 Unsteadiness on feet: Secondary | ICD-10-CM | POA: Diagnosis not present

## 2022-02-08 DIAGNOSIS — M6281 Muscle weakness (generalized): Secondary | ICD-10-CM | POA: Diagnosis not present

## 2022-02-08 DIAGNOSIS — R293 Abnormal posture: Secondary | ICD-10-CM

## 2022-02-08 DIAGNOSIS — R262 Difficulty in walking, not elsewhere classified: Secondary | ICD-10-CM

## 2022-02-08 DIAGNOSIS — R278 Other lack of coordination: Secondary | ICD-10-CM | POA: Diagnosis not present
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
## 2022-02-15 ENCOUNTER — Encounter: Payer: Self-pay | Admitting: Physical Therapy

## 2022-02-15 ENCOUNTER — Ambulatory Visit: Payer: Medicare PPO | Admitting: Physical Therapy

## 2022-02-15 DIAGNOSIS — R2681 Unsteadiness on feet: Secondary | ICD-10-CM

## 2022-02-15 DIAGNOSIS — R262 Difficulty in walking, not elsewhere classified: Secondary | ICD-10-CM | POA: Diagnosis not present

## 2022-02-15 DIAGNOSIS — R278 Other lack of coordination: Secondary | ICD-10-CM

## 2022-02-15 DIAGNOSIS — R293 Abnormal posture: Secondary | ICD-10-CM

## 2022-02-15 DIAGNOSIS — M6281 Muscle weakness (generalized): Secondary | ICD-10-CM | POA: Diagnosis not present
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
## 2022-02-22 ENCOUNTER — Ambulatory Visit: Payer: Medicare PPO | Attending: Rheumatology | Admitting: Physical Therapy

## 2022-02-22 ENCOUNTER — Encounter: Payer: Self-pay | Admitting: Physical Therapy

## 2022-02-22 DIAGNOSIS — R293 Abnormal posture: Secondary | ICD-10-CM | POA: Insufficient documentation

## 2022-02-22 DIAGNOSIS — R2681 Unsteadiness on feet: Secondary | ICD-10-CM | POA: Diagnosis not present

## 2022-02-22 DIAGNOSIS — R262 Difficulty in walking, not elsewhere classified: Secondary | ICD-10-CM | POA: Diagnosis not present

## 2022-02-22 DIAGNOSIS — M6281 Muscle weakness (generalized): Secondary | ICD-10-CM | POA: Insufficient documentation

## 2022-02-22 DIAGNOSIS — R278 Other lack of coordination: Secondary | ICD-10-CM | POA: Insufficient documentation
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
## 2022-03-01 ENCOUNTER — Encounter: Payer: Self-pay | Admitting: Rheumatology

## 2022-03-01 ENCOUNTER — Ambulatory Visit: Payer: Medicare PPO | Admitting: Physical Therapy

## 2022-03-01 ENCOUNTER — Ambulatory Visit: Payer: Medicare PPO | Admitting: Rheumatology

## 2022-03-01 VITALS — BP 162/90 | HR 91 | Ht 64.0 in | Wt 179.6 lb

## 2022-03-01 DIAGNOSIS — M4126 Other idiopathic scoliosis, lumbar region: Secondary | ICD-10-CM

## 2022-03-01 DIAGNOSIS — Z86010 Personal history of colonic polyps: Secondary | ICD-10-CM

## 2022-03-01 DIAGNOSIS — M5136 Other intervertebral disc degeneration, lumbar region: Secondary | ICD-10-CM | POA: Diagnosis not present

## 2022-03-01 DIAGNOSIS — G43009 Migraine without aura, not intractable, without status migrainosus: Secondary | ICD-10-CM

## 2022-03-01 DIAGNOSIS — D3502 Benign neoplasm of left adrenal gland: Secondary | ICD-10-CM

## 2022-03-01 DIAGNOSIS — M170 Bilateral primary osteoarthritis of knee: Secondary | ICD-10-CM | POA: Diagnosis not present

## 2022-03-01 DIAGNOSIS — N29 Other disorders of kidney and ureter in diseases classified elsewhere: Secondary | ICD-10-CM

## 2022-03-01 DIAGNOSIS — E119 Type 2 diabetes mellitus without complications: Secondary | ICD-10-CM

## 2022-03-01 DIAGNOSIS — M19041 Primary osteoarthritis, right hand: Secondary | ICD-10-CM | POA: Diagnosis not present

## 2022-03-01 DIAGNOSIS — M8589 Other specified disorders of bone density and structure, multiple sites: Secondary | ICD-10-CM

## 2022-03-01 DIAGNOSIS — K219 Gastro-esophageal reflux disease without esophagitis: Secondary | ICD-10-CM

## 2022-03-01 DIAGNOSIS — M19042 Primary osteoarthritis, left hand: Secondary | ICD-10-CM

## 2022-03-01 DIAGNOSIS — E1159 Type 2 diabetes mellitus with other circulatory complications: Secondary | ICD-10-CM | POA: Diagnosis not present

## 2022-03-01 DIAGNOSIS — I152 Hypertension secondary to endocrine disorders: Secondary | ICD-10-CM

## 2022-03-01 DIAGNOSIS — J309 Allergic rhinitis, unspecified: Secondary | ICD-10-CM

## 2022-03-01 DIAGNOSIS — R002 Palpitations: Secondary | ICD-10-CM

## 2022-03-07 ENCOUNTER — Other Ambulatory Visit: Payer: Self-pay | Admitting: Family Medicine

## 2022-03-08 ENCOUNTER — Ambulatory Visit: Payer: Medicare PPO | Admitting: Physical Therapy

## 2022-03-08 ENCOUNTER — Encounter: Payer: Self-pay | Admitting: Physical Therapy

## 2022-03-08 DIAGNOSIS — R293 Abnormal posture: Secondary | ICD-10-CM

## 2022-03-08 DIAGNOSIS — R278 Other lack of coordination: Secondary | ICD-10-CM | POA: Diagnosis not present

## 2022-03-08 DIAGNOSIS — R262 Difficulty in walking, not elsewhere classified: Secondary | ICD-10-CM

## 2022-03-08 DIAGNOSIS — R2681 Unsteadiness on feet: Secondary | ICD-10-CM | POA: Diagnosis not present

## 2022-03-08 DIAGNOSIS — M6281 Muscle weakness (generalized): Secondary | ICD-10-CM
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
## 2022-03-15 ENCOUNTER — Encounter: Payer: Self-pay | Admitting: Physical Therapy

## 2022-03-15 ENCOUNTER — Ambulatory Visit: Payer: Medicare PPO | Admitting: Physical Therapy

## 2022-03-15 DIAGNOSIS — R293 Abnormal posture: Secondary | ICD-10-CM | POA: Diagnosis not present

## 2022-03-15 DIAGNOSIS — R262 Difficulty in walking, not elsewhere classified: Secondary | ICD-10-CM | POA: Diagnosis not present

## 2022-03-15 DIAGNOSIS — M6281 Muscle weakness (generalized): Secondary | ICD-10-CM | POA: Diagnosis not present

## 2022-03-15 DIAGNOSIS — R2681 Unsteadiness on feet: Secondary | ICD-10-CM | POA: Diagnosis not present

## 2022-03-15 DIAGNOSIS — R278 Other lack of coordination: Secondary | ICD-10-CM | POA: Diagnosis not present

## 2022-03-15 NOTE — Therapy (Signed)
[place]. [place], [place], [address] Phone: [phone]   Fax:  [phone]  Patient Details  Name: JOSEPHINA MELCHER MRN: [id number] Date of Birth: [birth year] Referring Provider:  [name], MD  Encounter Date: 03/15/2022   Marcelina Morel, PT 03/15/2022, 6:07 PM  [place]. [place], [place], [address] Phone: [phone]   Fax:  [phone]
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
## 2022-03-22 ENCOUNTER — Ambulatory Visit: Payer: Medicare PPO | Attending: Rheumatology | Admitting: Physical Therapy

## 2022-03-29 ENCOUNTER — Ambulatory Visit: Payer: Medicare PPO | Admitting: Physical Therapy

## 2022-04-02 ENCOUNTER — Other Ambulatory Visit: Payer: Self-pay

## 2022-04-02 ENCOUNTER — Encounter (HOSPITAL_BASED_OUTPATIENT_CLINIC_OR_DEPARTMENT_OTHER): Payer: Self-pay | Admitting: Emergency Medicine

## 2022-04-02 ENCOUNTER — Emergency Department (HOSPITAL_BASED_OUTPATIENT_CLINIC_OR_DEPARTMENT_OTHER): Payer: Medicare PPO

## 2022-04-02 ENCOUNTER — Emergency Department (HOSPITAL_BASED_OUTPATIENT_CLINIC_OR_DEPARTMENT_OTHER)
Admission: EM | Admit: 2022-04-02 | Discharge: 2022-04-03 | Disposition: A | Discharge status: Home / Self Care | Payer: Medicare PPO | Attending: Emergency Medicine | Admitting: Emergency Medicine

## 2022-04-02 DIAGNOSIS — Z7982 Long term (current) use of aspirin: Secondary | ICD-10-CM | POA: Insufficient documentation

## 2022-04-02 DIAGNOSIS — H539 Unspecified visual disturbance: Secondary | ICD-10-CM

## 2022-04-02 DIAGNOSIS — H538 Other visual disturbances: Secondary | ICD-10-CM | POA: Insufficient documentation

## 2022-04-02 DIAGNOSIS — I6381 Other cerebral infarction due to occlusion or stenosis of small artery: Secondary | ICD-10-CM | POA: Diagnosis not present

## 2022-04-02 DIAGNOSIS — R519 Headache, unspecified: Secondary | ICD-10-CM | POA: Diagnosis not present

## 2022-04-02 DIAGNOSIS — I10 Essential (primary) hypertension: Secondary | ICD-10-CM | POA: Diagnosis not present

## 2022-04-02 LAB — CBC WITH DIFFERENTIAL/PLATELET
Abs Immature Granulocytes: 0.02 10*3/uL (ref 0.00–0.07)
Basophils Absolute: 0.1 10*3/uL (ref 0.0–0.1)
Basophils Relative: 1 %
Eosinophils Absolute: 0.3 10*3/uL (ref 0.0–0.5)
Eosinophils Relative: 3 %
HCT: 42.4 % (ref 36.0–46.0)
Hemoglobin: 14.5 g/dL (ref 12.0–15.0)
Immature Granulocytes: 0 %
Lymphocytes Relative: 30 %
Lymphs Abs: 2.6 10*3/uL (ref 0.7–4.0)
MCH: 29.4 pg (ref 26.0–34.0)
MCHC: 34.2 g/dL (ref 30.0–36.0)
MCV: 86 fL (ref 80.0–100.0)
Monocytes Absolute: 0.9 10*3/uL (ref 0.1–1.0)
Monocytes Relative: 10 %
Neutro Abs: 4.9 10*3/uL (ref 1.7–7.7)
Neutrophils Relative %: 56 %
Platelets: 225 10*3/uL (ref 150–400)
RBC: 4.93 MIL/uL (ref 3.87–5.11)
RDW: 12.8 % (ref 11.5–15.5)
WBC: 8.7 10*3/uL (ref 4.0–10.5)
nRBC: 0 % (ref 0.0–0.2)

## 2022-04-02 LAB — BASIC METABOLIC PANEL
Anion gap: 10 (ref 5–15)
BUN: 12 mg/dL (ref 8–23)
CO2: 26 mmol/L (ref 22–32)
Calcium: 9.1 mg/dL (ref 8.9–10.3)
Chloride: 99 mmol/L (ref 98–111)
Creatinine, Ser: 0.9 mg/dL (ref 0.44–1.00)
GFR, Estimated: >60 mL/min (ref >60)
Glucose, Bld: 111 mg/dL — ABNORMAL HIGH (ref 70–99)
Potassium: 3.3 mmol/L — ABNORMAL LOW (ref 3.5–5.1)
Sodium: 135 mmol/L (ref 135–145)

## 2022-04-02 NOTE — ED Triage Notes (Signed)
Pt states "everything started flashing, blue green" mainly on left side. Has been sick for past week. Was having issue getting words out before but went away. States has pain in face couple days ago. Was watching tv this evening and the colors started shooting around.

## 2022-04-02 NOTE — ED Provider Notes (Signed)
[facility] EMERGENCY DEPARTMENT  Provider Note  CSN: [id number] Arrival date & time: 04/02/22 2254  History Chief Complaint  Patient presents with   Eye Problem    Sabrina Aguirre is a 81 y.o. female with history of migraines reports onset of seeing spots and flashing in her left vision about an hour ago, no headache and no prior history of scotoma with her migraines. She reports feeling 'sick' for the last few days but unable to further quantify. Denies fever or vomiting. She has had some back pain but finished physical therapy for that. She reports she has felt like her words aren't coming out right for a few days but does not report symptoms consistent with aphasia or dysarthria. She has not had any arm or leg numbness/weakness, no facial droop. She wants to make sure she isn't having a stroke.    Home Medications Prior to Admission medications   Medication Sig Start Date End Date Taking? Authorizing Provider  Ascorbic Acid (VITAMIN C) 1000 MG tablet Take 1,000 mg by mouth daily.    [provider]  aspirin 81 MG tablet Take 81 mg by mouth daily.    [provider]  blood glucose meter kit and supplies Dispense based on patient and insurance preference. Use up to two times daily as directed. (FOR ICD-10 E10.9, E11.9). 06/07/21   [name], MD  Calcium Carb-Cholecalciferol (CALCIUM 600/VITAMIN D3 PO) Take 1 tablet by mouth daily.     [provider]  Cholecalciferol (VITAMIN D3) 1000 units CAPS Take 1,000 Units by mouth daily.    [provider]  Cinnamon 500 MG TABS Take 1 tablet by mouth daily.    [provider]  COLLAGEN PO Take 1 packet by mouth daily. power    [provider]  docusate sodium (COLACE) 100 MG capsule Take 200 mg by mouth 2 (two) times daily.    [provider]  fish oil-omega-3 fatty acids 1000 MG capsule Take 1 g by mouth daily.     [provider]  fluticasone (FLONASE) 50  MCG/ACT nasal spray Place 1 spray into both nostrils daily. Patient not taking: Reported on 12/14/2021 05/18/21   [name], MD  Levocetirizine Dihydrochloride (XYZAL PO) Take by mouth.    [provider]  MAGNESIUM PO Take by mouth daily.    [provider]  metFORMIN (GLUCOPHAGE) 500 MG tablet Take 1 tablet (500 mg total) by mouth 2 (two) times daily with a meal. 05/18/21   [name], MD  metoprolol succinate (TOPROL-XL) 50 MG 24 hr tablet Take 1 tablet (50 mg total) by mouth daily. Take with or immediately following a meal. 05/19/21   [name], MD  Multiple Vitamin (MULTI-VITAMINS) TABS Take 1 tablet by mouth daily.    [provider]  omeprazole (PRILOSEC) 20 MG capsule Take 1 capsule by mouth once daily 03/07/22   [name], MD  PSYLLIUM PO Take 1 packet by mouth 3 (three) times daily. Pt takes two times daily.    [provider]  SUMAtriptan (IMITREX) 100 MG tablet TAKE 1/2 (ONE-HALF) TABLET BY MOUTH EVERY 2 HOURS AS NEEDED 05/30/21   [name], MD  telmisartan-hydrochlorothiazide (MICARDIS HCT) 80-12.5 MG tablet Take 1 tablet by mouth daily. 05/19/21   [name], MD  vitamin B-12 (CYANOCOBALAMIN) 1000 MCG tablet Take 1,000 mcg by mouth daily.    [provider]  nortriptyline (PAMELOR) 10 MG capsule Take 1  capsule (10 mg total) by mouth at bedtime. 03/08/11 11/13/11  Denita Lung, MD  olmesartan (BENICAR) 40 MG tablet Take 40 mg by mouth daily.    11/13/11  [provider]     Allergies    Patient has no known allergies.   Review of Systems   Review of Systems Please see HPI for pertinent positives and negatives  Physical Exam BP (!) 168/88   Pulse 80   Temp 98.6 F (37 C) (Oral)   Resp 18   Ht $R'5\' 4"'ka$  (1.626 m)   Wt 81.2 kg   SpO2 98%   BMI 30.73 kg/m   Physical Exam Vitals and nursing note reviewed.  Constitutional:      Appearance: Normal appearance.  HENT:     Head: Normocephalic  and atraumatic.     Nose: Nose normal.     Mouth/Throat:     Mouth: Mucous membranes are moist.  Eyes:     Extraocular Movements: Extraocular movements intact.     Conjunctiva/sclera: Conjunctivae normal.     Pupils: Pupils are equal, round, and reactive to light.     Comments: Anterior chambers are clear  Cardiovascular:     Rate and Rhythm: Normal rate.  Pulmonary:     Effort: Pulmonary effort is normal.     Breath sounds: Normal breath sounds.  Abdominal:     General: Abdomen is flat.     Palpations: Abdomen is soft.     Tenderness: There is no abdominal tenderness.  Musculoskeletal:        General: No swelling. Normal range of motion.     Cervical back: Neck supple.  Skin:    General: Skin is warm and dry.  Neurological:     General: No focal deficit present.     Mental Status: She is alert and oriented to person, place, and time.     Cranial Nerves: No cranial nerve deficit.     Sensory: No sensory deficit.     Motor: No weakness.     Coordination: Coordination normal.     Comments: Normal visual fields, no dysarthria or aphasia  Psychiatric:        Mood and Affect: Mood normal.     ED Results / Procedures / Treatments   EKG EKG Interpretation  Date/Time:  Monday April 03 2022 00:40:23 EDT Ventricular Rate:  83 PR Interval:  92 QRS Duration: 92 QT Interval:  387 QTC Calculation: 455 R Axis:   26 Text Interpretation: Sinus rhythm Short PR interval Low voltage, precordial leads Since last tracing Rate slower Confirmed by Calvert Cantor [phone]) on 04/03/2022 12:43:29 AM  Procedures Procedures  Medications Ordered in the ED Medications - No data to display  Initial Impression and Plan  Patient here with symptoms most consistent with scotoma,although she has never experienced that before with her migraines and does not have a headache today. She has no focal deficits on exam in triage. Will check labs and CT head, but defer activating Code Stroke as she has  NIHSS of 0 at this time.   ED Course   Clinical Course as of 04/03/22 0124  Sun Apr 02, 2022  2320 CBC is normal.  [CS]  2336 BMP is unremarkable.  [CS]  2338 I personally viewed the images from radiology studies and agree with radiologist interpretation: CT neg for acute process  [CS]  Mon Apr 03, 2022  0122 Patient symptoms improving. Still no focal neuro deficits. Patient is reassured no signs of  stroke. Recommend close outpatient Ophtho follow up tomorrow for thorough exam. RTED for any other concerns.  [CS]    Clinical Course User Index [CS] Truddie Hidden, MD     MDM Rules/Calculators/A&P Medical Decision Making Given presenting complaint, I considered that admission might be necessary. After review of results from ED lab and/or imaging studies, admission to the hospital is not indicated at this time.    Problems Addressed: Visual disturbance: acute illness or injury  Amount and/or Complexity of Data Reviewed Labs: ordered. Decision-making details documented in ED Course. Radiology: ordered and independent interpretation performed. Decision-making details documented in ED Course. ECG/medicine tests: ordered and independent interpretation performed. Decision-making details documented in ED Course.  Risk Decision regarding hospitalization.    Final Clinical Impression(s) / ED Diagnoses Final diagnoses:  Visual disturbance    Rx / DC Orders ED Discharge Orders     None        Truddie Hidden, MD 04/03/22 [phone]

## 2022-04-03 ENCOUNTER — Other Ambulatory Visit: Payer: Self-pay

## 2022-04-03 DIAGNOSIS — H40013 Open angle with borderline findings, low risk, bilateral: Secondary | ICD-10-CM | POA: Diagnosis not present

## 2022-04-03 DIAGNOSIS — H26493 Other secondary cataract, bilateral: Secondary | ICD-10-CM | POA: Diagnosis not present

## 2022-04-03 DIAGNOSIS — G43B0 Ophthalmoplegic migraine, not intractable: Secondary | ICD-10-CM | POA: Diagnosis not present

## 2022-04-03 DIAGNOSIS — H02422 Myogenic ptosis of left eyelid: Secondary | ICD-10-CM | POA: Diagnosis not present

## 2022-04-03 DIAGNOSIS — H43812 Vitreous degeneration, left eye: Secondary | ICD-10-CM | POA: Diagnosis not present

## 2022-04-03 DIAGNOSIS — Z961 Presence of intraocular lens: Secondary | ICD-10-CM | POA: Diagnosis not present

## 2022-04-03 DIAGNOSIS — E119 Type 2 diabetes mellitus without complications: Secondary | ICD-10-CM | POA: Diagnosis not present

## 2022-04-03 LAB — URINALYSIS, ROUTINE W REFLEX MICROSCOPIC
Bilirubin Urine: NEGATIVE
Glucose, UA: NEGATIVE mg/dL
Hgb urine dipstick: NEGATIVE
Ketones, ur: NEGATIVE mg/dL
Leukocytes,Ua: NEGATIVE
Nitrite: NEGATIVE
Protein, ur: 30 mg/dL — AB
Specific Gravity, Urine: 1.015 (ref 1.005–1.030)
pH: 6.5 (ref 5.0–8.0)

## 2022-04-03 LAB — URINALYSIS, MICROSCOPIC (REFLEX)

## 2022-04-04 ENCOUNTER — Ambulatory Visit (INDEPENDENT_AMBULATORY_CARE_PROVIDER_SITE_OTHER): Payer: Medicare PPO | Admitting: Family Medicine

## 2022-04-04 ENCOUNTER — Encounter: Payer: Self-pay | Admitting: Family Medicine

## 2022-04-04 VITALS — BP 138/90 | HR 85 | Temp 97.9°F | Ht 64.0 in | Wt 177.4 lb

## 2022-04-04 DIAGNOSIS — E1159 Type 2 diabetes mellitus with other circulatory complications: Secondary | ICD-10-CM | POA: Diagnosis not present

## 2022-04-04 DIAGNOSIS — E119 Type 2 diabetes mellitus without complications: Secondary | ICD-10-CM

## 2022-04-04 DIAGNOSIS — E876 Hypokalemia: Secondary | ICD-10-CM | POA: Diagnosis not present

## 2022-04-04 DIAGNOSIS — M4125 Other idiopathic scoliosis, thoracolumbar region: Secondary | ICD-10-CM | POA: Diagnosis not present

## 2022-04-04 DIAGNOSIS — I152 Hypertension secondary to endocrine disorders: Secondary | ICD-10-CM | POA: Diagnosis not present

## 2022-04-04 DIAGNOSIS — M545 Low back pain, unspecified: Secondary | ICD-10-CM | POA: Diagnosis not present

## 2022-04-04 DIAGNOSIS — R197 Diarrhea, unspecified: Secondary | ICD-10-CM

## 2022-04-04 DIAGNOSIS — I471 Supraventricular tachycardia: Secondary | ICD-10-CM

## 2022-04-04 DIAGNOSIS — M47816 Spondylosis without myelopathy or radiculopathy, lumbar region: Secondary | ICD-10-CM | POA: Diagnosis not present

## 2022-04-04 DIAGNOSIS — G8929 Other chronic pain: Secondary | ICD-10-CM

## 2022-04-04 LAB — CBC WITH DIFFERENTIAL/PLATELET
Basophils Absolute: 0.1 10*3/uL (ref 0.0–0.1)
Basophils Relative: 0.9 % (ref 0.0–3.0)
Eosinophils Absolute: 0.1 10*3/uL (ref 0.0–0.7)
Eosinophils Relative: 1.9 % (ref 0.0–5.0)
HCT: 40.9 % (ref 36.0–46.0)
Hemoglobin: 13.7 g/dL (ref 12.0–15.0)
Lymphocytes Relative: 22.6 % (ref 12.0–46.0)
Lymphs Abs: 1.4 10*3/uL (ref 0.7–4.0)
MCHC: 33.5 g/dL (ref 30.0–36.0)
MCV: 87.2 fl (ref 78.0–100.0)
Monocytes Absolute: 0.5 10*3/uL (ref 0.1–1.0)
Monocytes Relative: 8.6 % (ref 3.0–12.0)
Neutro Abs: 4.1 10*3/uL (ref 1.4–7.7)
Neutrophils Relative %: 66 % (ref 43.0–77.0)
Platelets: 205 10*3/uL (ref 150.0–400.0)
RBC: 4.69 Mil/uL (ref 3.87–5.11)
RDW: 13.3 % (ref 11.5–15.5)
WBC: 6.2 10*3/uL (ref 4.0–10.5)

## 2022-04-04 LAB — MAGNESIUM: Magnesium: 1.6 mg/dL (ref 1.5–2.5)

## 2022-04-04 LAB — COMPREHENSIVE METABOLIC PANEL
ALT: 37 U/L — ABNORMAL HIGH (ref 0–35)
AST: 33 U/L (ref 0–37)
Albumin: 4.4 g/dL (ref 3.5–5.2)
Alkaline Phosphatase: 65 U/L (ref 39–117)
BUN: 9 mg/dL (ref 6–23)
CO2: 28 mEq/L (ref 19–32)
Calcium: 10.4 mg/dL (ref 8.4–10.5)
Chloride: 96 mEq/L (ref 96–112)
Creatinine, Ser: 0.71 mg/dL (ref 0.40–1.20)
GFR: 79.13 mL/min (ref >60.00)
Glucose, Bld: 113 mg/dL — ABNORMAL HIGH (ref 70–99)
Potassium: 3.6 mEq/L (ref 3.5–5.1)
Sodium: 133 mEq/L — ABNORMAL LOW (ref 135–145)
Total Bilirubin: 0.6 mg/dL (ref 0.2–1.2)
Total Protein: 7.4 g/dL (ref 6.0–8.3)

## 2022-04-04 LAB — POCT GLYCOSYLATED HEMOGLOBIN (HGB A1C): Hemoglobin A1C: 6.4 % — AB (ref 4.0–5.6)

## 2022-04-04 LAB — LIPID PANEL
Cholesterol: 92 mg/dL (ref 0–200)
HDL: 46.9 mg/dL (ref >39.00)
LDL Cholesterol: 19 mg/dL (ref 0–99)
NonHDL: 44.63
Total CHOL/HDL Ratio: 2
Triglycerides: 128 mg/dL (ref 0.0–149.0)
VLDL: 25.6 mg/dL (ref 0.0–40.0)

## 2022-04-04 LAB — MICROALBUMIN / CREATININE URINE RATIO
Creatinine,U: 36.1 mg/dL
Microalb Creat Ratio: 29.3 mg/g (ref 0.0–30.0)
Microalb, Ur: 10.6 mg/dL — ABNORMAL HIGH (ref 0.0–1.9)

## 2022-04-04 LAB — TSH: TSH: 0.82 u[IU]/mL (ref 0.35–5.50)

## 2022-04-04 MED ORDER — DICLOFENAC SODIUM 75 MG PO TBEC: 75.0000 mg | DELAYED_RELEASE_TABLET | Freq: Two times a day (BID) | ORAL | 1 refills | Status: DC | PRN

## 2022-04-04 MED ORDER — CYCLOBENZAPRINE HCL 10 MG PO TABS: 10.0000 mg | ORAL_TABLET | Freq: Every evening | ORAL | 0 refills | Status: DC | PRN

## 2022-04-04 NOTE — Patient Instructions (Addendum)
Please follow up as scheduled for your next visit with me: 05/17/2022   We will call you if your MRI is approved for your back.   Stop the stool softener and metamucil while your stools are loose. You may restart miralax and colace IF your stool becomes hard and small and difficult to pass again.   I will release your lab results to you on your MyChart account with further instructions. You may see the results before I do, but when I review them I will send you a message with my report or have my assistant call you if things need to be discussed. Please reply to my message with any questions. Thank you!   If you have any questions or concerns, please don't hesitate to send me a message via MyChart or call the office at [phone]. Thank you for visiting with [place] today! It's our pleasure caring for you.

## 2022-04-04 NOTE — Progress Notes (Signed)
Subjective  CC:  Chief Complaint  Patient presents with   Back Pain   Diarrhea    Pt stated that she has been having diarrhea from the past week and has not made any changes from getting better. Everything that she seems to eat seems to go straight thru her    Last here in January 2023.  HPI: Sabrina Aguirre is a 81 y.o. female who presents to the office today for follow up of diabetes and problems listed above in the chief complaint.  Had recent ER visit: reviewed.  Pt says she has been sick for awhile now: she is vague though. Can't really qualify further but she does say she has been having some diarrhea. ER with Brain CT w/o acute findings. She has chronic back pain and wants refills on mm relaxer and nsaids.  Diabetes follow up: Her diabetic control is reported as Unchanged. She is on metforming bid but she does not feel this is affecting her diarrhea.  She denies exertional CP or SOB or symptomatic hypoglycemia. She denies foot sores or paresthesias.  HTN on bb and arb/hctz. No cp or sob Due for lipid panel. Non fasting today has low LDL not on statin.  Diarrhea w/o blood or mucous. No abdominal pain.   Wt Readings from Last 3 Encounters:  04/04/22 177 lb 6.4 oz (80.5 kg)  04/02/22 179 lb (81.2 kg)  03/01/22 179 lb 9.6 oz (81.5 kg)    BP Readings from Last 3 Encounters:  04/04/22 (!) 138/90  04/03/22 (!) 143/90  03/01/22 (!) 162/90    Assessment  1. Hypertension associated with diabetes ([place])   2. Type 2 diabetes mellitus without complication, without long-term current use of insulin ([place])   3. PAT (paroxysmal atrial tachycardia) ([place])   4. Hypokalemia   5. Diarrhea, unspecified type   6. Chronic midline low back pain without sciatica   7. Other idiopathic scoliosis, thoracolumbar region   8. Spondylosis of lumbar region without myelopathy or radiculopathy      Plan  Diabetes is currently very well controlled. On met bid. Check renal function HTN: borderline control  today but she doesn't feel well. Back pain Back pain: finishing PT.  Diarrhea: start eval with pathogen panel.   Follow up: 3 mo. Orders Placed This Encounter  Procedures   CBC with Differential/Platelet   Comprehensive metabolic panel   Lipid panel   Magnesium   TSH   Gastrointestinal Pathogen Pnl RT, PCR   Microalbumin / creatinine urine ratio   POCT HgB A1C   Meds ordered this encounter  Medications   cyclobenzaprine (FLEXERIL) 10 MG tablet    Sig: Take 1 tablet (10 mg total) by mouth at bedtime as needed for muscle spasms.    Dispense:  30 tablet    Refill:  0   diclofenac (VOLTAREN) 75 MG EC tablet    Sig: Take 1 tablet (75 mg total) by mouth 2 (two) times daily as needed (back pain).    Dispense:  30 tablet    Refill:  1      Immunization History  Administered Date(s) Administered   Fluad Quad(high Dose 65+) 04/27/2020, 05/18/2021   Influenza Split 05/06/2012   Influenza Whole 11/02/2008, 05/10/2013   Influenza, High Dose Seasonal PF 05/12/2014, 05/26/2015, 05/22/2016, 04/25/2019   PFIZER(Purple Top)SARS-COV-2 Vaccination 09/11/2019, 10/02/2019, 05/17/2020, 11/21/2020, 05/25/2021   Pneumococcal Conjugate-13 05/10/2015   Pneumococcal Polysaccharide-23 05/19/2013, 04/25/2019   Tdap 11/13/2011   Zoster Recombinat (Shingrix) 09/01/2017, 01/28/2018  Zoster, Live 05/07/2012    Diabetes Related Lab Review: Lab Results  Component Value Date   HGBA1C 6.4 (A) 04/04/2022   HGBA1C 6.0 (A) 09/13/2021   HGBA1C 7.0 (A) 02/02/2021    Lab Results  Component Value Date   MICROALBUR 10.6 (H) 04/04/2022   Lab Results  Component Value Date   CREATININE 0.71 04/04/2022   BUN 9 04/04/2022   NA 133 (L) 04/04/2022   K 3.6 04/04/2022   CL 96 04/04/2022   CO2 28 04/04/2022   Lab Results  Component Value Date   CHOL 92 04/04/2022   CHOL 86 02/02/2021   CHOL 94 01/23/2020   Lab Results  Component Value Date   HDL 46.90 04/04/2022   HDL 51.00 02/02/2021   HDL 48.50  01/23/2020   Lab Results  Component Value Date   LDLCALC 19 04/04/2022   LDLCALC 13 02/02/2021   LDLCALC 23 01/23/2020   Lab Results  Component Value Date   TRIG 128.0 04/04/2022   TRIG 112.0 02/02/2021   TRIG 113.0 01/23/2020   Lab Results  Component Value Date   CHOLHDL 2 04/04/2022   CHOLHDL 2 02/02/2021   CHOLHDL 2 01/23/2020   No results found for: "LDLDIRECT" The ASCVD Risk score (Arnett DK, et al., 2019) failed to calculate for the following reasons:   The 2019 ASCVD risk score is only valid for ages 50 to 35 I have reviewed the [place], Fam and Soc history. Patient Active Problem List   Diagnosis Date Noted   Type 2 diabetes mellitus without complication, without long-term current use of insulin ([place]) 11/13/2011    Priority: High   Hypertension associated with diabetes ([place]) 02/03/2011    Priority: High   Adrenal adenoma     Priority: Medium    Osteopenia 04/04/2011    Priority: Medium     Dexa 03/2020 lowest T = - 1.7; recheck 2 years.     Gastro-esophageal reflux disease without esophagitis 02/03/2011    Priority: Medium    Colon polyps 02/03/2011    Priority: Medium    Migraine without status migrainosus, not intractable 02/03/2011    Priority: Medium    Primary osteoarthritis of right hand 10/12/2016    Priority: Low   Persistent cough 03/19/2015    Priority: Low   Chronic allergic rhinitis 02/03/2011    Priority: Low   PAT (paroxysmal atrial tachycardia) ([place]) 04/04/2022   Palpitations 05/18/2021    Social History: Patient  reports that she has quit smoking. Her smoking use included cigarettes. She has never been exposed to tobacco smoke. She has never used smokeless tobacco. She reports that she does not drink alcohol and does not use drugs.  Review of Systems: Ophthalmic: negative for eye pain, loss of vision or double vision Cardiovascular: negative for chest pain Respiratory: negative for SOB or persistent cough Gastrointestinal: negative for  abdominal pain Genitourinary: negative for dysuria or gross hematuria MSK: negative for foot lesions Neurologic: negative for weakness or gait disturbance  Objective  Vitals: BP (!) 138/90   Pulse 85   Temp 97.9 F (36.6 C)   Ht $R'5\' 4"'aP$  (1.626 m)   Wt 177 lb 6.4 oz (80.5 kg)   SpO2 98%   BMI 30.45 kg/m  General: well appearing, no acute distress  Psych:  Alert and oriented, normal mood and affect HEENT:  Normocephalic, atraumatic, moist mucous membranes, supple neck  Cardiovascular:  Nl S1 and S2, RRR without murmur, gallop or rub. no edema Respiratory:  Good  breath sounds bilaterally, CTAB with normal effort, no rales Gastrointestinal: normal BS, soft, nontender Skin:  Warm, no rashes Neurologic:   Mental status is normal. normal gait Foot exam: no erythema, pallor, or cyanosis visible nl proprioception and sensation to monofilament testing bilaterally, +2 distal pulses bilaterally    Diabetic education: ongoing education regarding chronic disease management for diabetes was given today. We continue to reinforce the ABC's of diabetic management: A1c (<7 or 8 dependent upon patient), tight blood pressure control, and cholesterol management with goal LDL < 100 minimally. We discuss diet strategies, exercise recommendations, medication options and possible side effects. At each visit, we review recommended immunizations and preventive care recommendations for diabetics and stress that good diabetic control can prevent other problems. See below for this patient's data.   Commons side effects, risks, benefits, and alternatives for medications and treatment plan prescribed today were discussed, and the patient expressed understanding of the given instructions. Patient is instructed to call or message via MyChart if he/she has any questions or concerns regarding our treatment plan. No barriers to understanding were identified. We discussed Red Flag symptoms and signs in detail. Patient  expressed understanding regarding what to do in case of urgent or emergency type symptoms.  Medication list was reconciled, printed and provided to the patient in AVS. Patient instructions and summary information was reviewed with the patient as documented in the AVS. This note was prepared with assistance of Dragon voice recognition software. Occasional wrong-word or sound-a-like substitutions may have occurred due to the inherent limitations of voice recognition software  This visit occurred during the SARS-CoV-2 public health emergency.  Safety protocols were in place, including screening questions prior to the visit, additional usage of staff PPE, and extensive cleaning of exam room while observing appropriate contact time as indicated for disinfecting solutions.

## 2022-05-04 ENCOUNTER — Other Ambulatory Visit: Payer: Self-pay | Admitting: Family Medicine

## 2022-05-15 ENCOUNTER — Encounter: Payer: Self-pay | Admitting: *Deleted

## 2022-05-17 ENCOUNTER — Ambulatory Visit (INDEPENDENT_AMBULATORY_CARE_PROVIDER_SITE_OTHER): Payer: Medicare PPO | Admitting: Family Medicine

## 2022-05-17 ENCOUNTER — Encounter: Payer: Self-pay | Admitting: Family Medicine

## 2022-05-17 VITALS — BP 158/90 | HR 93 | Temp 97.8°F | Ht 64.0 in | Wt 179.8 lb

## 2022-05-17 DIAGNOSIS — M8589 Other specified disorders of bone density and structure, multiple sites: Secondary | ICD-10-CM | POA: Diagnosis not present

## 2022-05-17 DIAGNOSIS — I471 Supraventricular tachycardia: Secondary | ICD-10-CM

## 2022-05-17 DIAGNOSIS — E1159 Type 2 diabetes mellitus with other circulatory complications: Secondary | ICD-10-CM | POA: Diagnosis not present

## 2022-05-17 DIAGNOSIS — Z0000 Encounter for general adult medical examination without abnormal findings: Secondary | ICD-10-CM

## 2022-05-17 DIAGNOSIS — E119 Type 2 diabetes mellitus without complications: Secondary | ICD-10-CM | POA: Diagnosis not present

## 2022-05-17 DIAGNOSIS — D1724 Benign lipomatous neoplasm of skin and subcutaneous tissue of left leg: Secondary | ICD-10-CM | POA: Diagnosis not present

## 2022-05-17 DIAGNOSIS — I152 Hypertension secondary to endocrine disorders: Secondary | ICD-10-CM

## 2022-05-17 DIAGNOSIS — J4520 Mild intermittent asthma, uncomplicated: Secondary | ICD-10-CM | POA: Diagnosis not present

## 2022-05-17 DIAGNOSIS — I4719 Other supraventricular tachycardia: Secondary | ICD-10-CM

## 2022-05-17 DIAGNOSIS — K219 Gastro-esophageal reflux disease without esophagitis: Secondary | ICD-10-CM

## 2022-05-17 DIAGNOSIS — J45909 Unspecified asthma, uncomplicated: Secondary | ICD-10-CM | POA: Insufficient documentation

## 2022-05-17 MED ORDER — TELMISARTAN-HCTZ 80-25 MG PO TABS: 1.0000 | ORAL_TABLET | Freq: Every day | ORAL | 3 refills | Status: DC

## 2022-05-17 MED ORDER — MONTELUKAST SODIUM 10 MG PO TABS: 10.0000 mg | ORAL_TABLET | Freq: Every day | ORAL | 3 refills | Status: DC
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
## 2022-05-17 NOTE — Patient Instructions (Signed)
Please return in 3 months to recheck your blood pressure and diabetes.  I have ordered a larger dose of your Micardis for your blood pressure.  Start the montelukast to see if it helps your chest tightness.   If you have any questions or concerns, please don't hesitate to send me a message via MyChart or call the office at [phone]. Thank you for visiting with Sabrina Aguirre today! It's our pleasure caring for you.

## 2022-05-23 ENCOUNTER — Other Ambulatory Visit: Payer: Self-pay | Admitting: Family Medicine

## 2022-05-23 DIAGNOSIS — M8589 Other specified disorders of bone density and structure, multiple sites: Secondary | ICD-10-CM

## 2022-05-23 DIAGNOSIS — Z1231 Encounter for screening mammogram for malignant neoplasm of breast: Secondary | ICD-10-CM

## 2022-05-25 ENCOUNTER — Emergency Department (HOSPITAL_BASED_OUTPATIENT_CLINIC_OR_DEPARTMENT_OTHER)
Admission: EM | Admit: 2022-05-25 | Discharge: 2022-05-25 | Disposition: A | Discharge status: Home / Self Care | Payer: Medicare PPO | Attending: Emergency Medicine | Admitting: Emergency Medicine

## 2022-05-25 ENCOUNTER — Emergency Department (HOSPITAL_BASED_OUTPATIENT_CLINIC_OR_DEPARTMENT_OTHER): Payer: Medicare PPO

## 2022-05-25 ENCOUNTER — Encounter (HOSPITAL_BASED_OUTPATIENT_CLINIC_OR_DEPARTMENT_OTHER): Payer: Self-pay

## 2022-05-25 ENCOUNTER — Telehealth: Payer: Self-pay | Admitting: Cardiovascular Disease

## 2022-05-25 ENCOUNTER — Other Ambulatory Visit: Payer: Self-pay

## 2022-05-25 DIAGNOSIS — R079 Chest pain, unspecified: Secondary | ICD-10-CM | POA: Insufficient documentation

## 2022-05-25 DIAGNOSIS — M79602 Pain in left arm: Secondary | ICD-10-CM | POA: Insufficient documentation

## 2022-05-25 DIAGNOSIS — R0789 Other chest pain: Secondary | ICD-10-CM | POA: Diagnosis not present

## 2022-05-25 LAB — CBC
HCT: 42.6 % (ref 36.0–46.0)
Hemoglobin: 14.3 g/dL (ref 12.0–15.0)
MCH: 29 pg (ref 26.0–34.0)
MCHC: 33.6 g/dL (ref 30.0–36.0)
MCV: 86.4 fL (ref 80.0–100.0)
Platelets: 240 10*3/uL (ref 150–400)
RBC: 4.93 MIL/uL (ref 3.87–5.11)
RDW: 12.9 % (ref 11.5–15.5)
WBC: 6.8 10*3/uL (ref 4.0–10.5)
nRBC: 0 % (ref 0.0–0.2)

## 2022-05-25 LAB — BASIC METABOLIC PANEL
Anion gap: 9 (ref 5–15)
BUN: 12 mg/dL (ref 8–23)
CO2: 28 mmol/L (ref 22–32)
Calcium: 9.9 mg/dL (ref 8.9–10.3)
Chloride: 97 mmol/L — ABNORMAL LOW (ref 98–111)
Creatinine, Ser: 0.75 mg/dL (ref 0.44–1.00)
GFR, Estimated: >60 mL/min (ref >60)
Glucose, Bld: 158 mg/dL — ABNORMAL HIGH (ref 70–99)
Potassium: 3.4 mmol/L — ABNORMAL LOW (ref 3.5–5.1)
Sodium: 134 mmol/L — ABNORMAL LOW (ref 135–145)

## 2022-05-25 LAB — TROPONIN I (HIGH SENSITIVITY)
Troponin I (High Sensitivity): 3 ng/L (ref <18)
Troponin I (High Sensitivity): 3 ng/L (ref <18)

## 2022-05-25 LAB — GLUCOSE, CAPILLARY: Glucose-Capillary: 115 mg/dL — ABNORMAL HIGH (ref 70–99)

## 2022-05-25 NOTE — ED Notes (Signed)
ED Provider at bedside. 

## 2022-05-25 NOTE — ED Notes (Signed)
CBG done due to pt stating she has not had a meal this entire day, only coffee this morning. Offered to give pt a meal per triage RN approval. Pt refused anything to eat or drink. She states "Eating is causing me to have to go run to the bathroom. I want to wait until I am at home." Triage RN aware.

## 2022-05-25 NOTE — Telephone Encounter (Signed)
Pt c/o of Chest Pain: STAT if CP now or developed within 24 hours  1. Are you having CP right now? Left arm pain now   2. Are you experiencing any other symptoms (ex. SOB, nausea, vomiting, sweating)? Swollen veins   3. How long have you been experiencing CP? Started last night when she got in bed.  4. Is your CP continuous or coming and going? Continuous   5. Have you taken Nitroglycerin? No.  ?

## 2022-05-25 NOTE — ED Provider Notes (Signed)
[facility] Provider Note   CSN: [id number] Arrival date & time: 05/25/22  1252     History Chief Complaint  Patient presents with   Arm Pain    HPI Sabrina Aguirre is a 81 y.o. female presenting for left arm pain.  Patient states that she has been having left-sided shoulder pain radiating down her arm throughout the day yesterday.  It was persistent when she woke up this morning though it has not resolved.  She denies fevers or chills, nausea vomiting, syncope shortness of breath. She is otherwise ambulatory tolerating p.o. intake at this time.  No known sick contacts.  She has a history of hypertension, diabetes, obesity.  Patient's recorded medical, surgical, social, medication list and allergies were reviewed in the Snapshot window as part of the initial history.   Review of Systems   Review of Systems  Constitutional:  Negative for chills and fever.  HENT:  Negative for ear pain and sore throat.   Eyes:  Negative for pain and visual disturbance.  Respiratory:  Negative for cough and shortness of breath.   Cardiovascular:  Negative for chest pain and palpitations.  Gastrointestinal:  Negative for abdominal pain and vomiting.  Genitourinary:  Negative for dysuria and hematuria.  Musculoskeletal:  Positive for arthralgias. Negative for back pain.  Skin:  Negative for color change and rash.  Neurological:  Negative for seizures and syncope.  All other systems reviewed and are negative.   Physical Exam Updated Vital Signs BP (!) 169/100 (BP Location: Right Arm)   Pulse 87   Temp 98.1 F (36.7 C) (Oral)   Resp 18   Ht '5\' 4"'$  (1.626 m)   Wt 81.6 kg   SpO2 98%   BMI 30.88 kg/m  Physical Exam Vitals and nursing note reviewed.  Constitutional:      General: She is not in acute distress.    Appearance: She is well-developed.  HENT:     Head: Normocephalic and atraumatic.  Eyes:     Conjunctiva/sclera: Conjunctivae normal.   Cardiovascular:     Rate and Rhythm: Normal rate and regular rhythm.     Heart sounds: No murmur heard. Pulmonary:     Effort: Pulmonary effort is normal. No respiratory distress.     Breath sounds: Normal breath sounds.  Abdominal:     General: There is no distension.     Palpations: Abdomen is soft.     Tenderness: There is no abdominal tenderness. There is no right CVA tenderness or left CVA tenderness.  Musculoskeletal:        General: No swelling or tenderness. Normal range of motion.     Cervical back: Neck supple.  Skin:    General: Skin is warm and dry.  Neurological:     General: No focal deficit present.     Mental Status: She is alert and oriented to person, place, and time. Mental status is at baseline.     Cranial Nerves: No cranial nerve deficit.      ED Course/ Medical Decision Making/ A&P    Procedures Procedures   Medications Ordered in ED Medications - No data to display Medical Decision Making: [name] is a 81 y.o. female who presented to the ED today with left shoulder  pain, detailed above.  Based on patient's comorbidities, patient has a heart score of 4.    Patient's presentation is complicated by their history of advanced age, multiple medical comorbidities.  Patient placed on continuous  vitals and telemetry monitoring while in ED which was reviewed periodically.  Complete initial physical exam performed, notably the patient was hemodynamically stable in no acute distress.  Patient ambulatory tolerating p.o. intake on arrival.  She endorses complete resolution of all symptoms..   Reviewed and confirmed nursing documentation for past medical history, family history, social history.    Initial Assessment: With the patient's presentation of left-sided chest pain, most likely diagnosis is musculoskeletal chest pain versus GERD, although ACS remains on the differential. Other diagnoses were considered including (but not limited to) pulmonary  embolism, community-acquired pneumonia, aortic dissection, pneumothorax, underlying bony abnormality, anemia. These are considered less likely due to history of present illness and physical exam findings.    In particular, concerning pulmonary embolism: they deny malignancy, recent surgery, history of DVT, or calf tenderness leading to a low risk Wells score. Aortic Dissection also reconsidered but seems less likely based on the location, quality, onset, and severity of symptoms in this case. Also has a lack of underlying history of AD or TAA.  This is most consistent with an acute life/limb threatening illness complicated by underlying chronic conditions.   Initial Plan: Evaluate for ACS with delta troponin and EKG evaluated as below  Evaluate for dissection, bony abnormality, or pneumonia with chest x-ray and screening laboratory evaluation including CBC, BMP  Further evaluation for pulmonary embolism not indicated at this time based on patient's PERC and Wells score.  Further evaluation for Thoracic Aortic Dissection not indicated at this time based on patient's clinical history and PE findings.   Initial Study Results: EKG was reviewed independently. Rate, rhythm, axis, intervals all examined and without medically relevant abnormality. ST segments without concerns for elevations.    Laboratory  Delta  troponin demonstrated no acute abnormality  CBC and BMP without obvious metabolic or inflammatory abnormalities requiring further evaluation   Radiology  DG Chest 2 View  Result Date: 05/25/2022 CLINICAL DATA:  Shoulder pain. EXAM: CHEST - 2 VIEW COMPARISON:  July 24, 2018. FINDINGS: The heart size and mediastinal contours are within normal limits. Both lungs are clear. Elevated right hemidiaphragm is noted. The visualized skeletal structures are unremarkable. IMPRESSION: No active cardiopulmonary disease. Electronically Signed   By: Marijo Conception M.D.   On: 05/25/2022 14:06    Final  Assessment and Plan: Shared medical decision making with patient.  She has an elevated hear score raising her risk for cardiac disease within the next month.  However her pain is atypical radiating to left shoulder, completely resolved and her evaluation is reassuring today.  Her symptoms were present for 16 hours prior to arrival and have now resolved over 4 hours while here.  Offered patient transfer for further evaluation to main campus and immediate cardiac consultation.  However patient has already called her primary cardiologist today and is requesting discharge to follow-up with her cardiologist in the outpatient setting.  Given her elevated risk factors at baseline and resolution of symptoms, I do believe that this is reasonable.  Strict return precautions regarding development of cardiac symptoms reinforced to the patient who expressed understanding.  Patient discharged with no further acute events.  Clinical Impression:  1. Left arm pain   2. Chest pain, unspecified type      Data Unavailable   Final Clinical Impression(s) / ED Diagnoses Final diagnoses:  Left arm pain  Chest pain, unspecified type    Rx / DC Orders ED Discharge Orders     None  Tretha Sciara, MD 05/25/22 1816

## 2022-05-25 NOTE — Telephone Encounter (Signed)
Spoke to  patient . Patient states she has left arm pain  since last evening  around 6 or 7 pm .  Patient states she goes to bed around this time each day.    She states it feels like a heaviness  swelling  from shoulder to her finger tips.  The swelling is not present today.   No change in temperature with  arm. No chest discomfort,no shortness of breath , -  no radiation of pain except  twinge left side of neck and shoulder.  Some nausea  this morning but patient states she has that daily in the morning - trying to clear  up her phlegm.  Patient has not way of checking  heart rate or blood pressure.   No appetite RN recommended patient contacting primary doctor- Dr Jonni Sanger.  The symptoms does not have the  classic cardiac symptoms. Patient states she does not want see her primary . If it is dealing with my heart I want the heart doctor to take care of it. RN informed patient if she is having active discomfort she will needed to get it evaluated at  ER or Urgent care. Patient verbalized understanding.  Appointment schedule for next week with Dr  Sallyanne Kuster 05/30/22

## 2022-05-25 NOTE — ED Triage Notes (Signed)
Left arm pain - radiating from shoulder down to wrist x 1 month - worse today. No weakness or neuro deficits noted or reported.

## 2022-05-30 ENCOUNTER — Encounter: Payer: Self-pay | Admitting: Cardiovascular Disease

## 2022-05-30 ENCOUNTER — Ambulatory Visit: Payer: Medicare PPO | Attending: Cardiovascular Disease | Admitting: Cardiovascular Disease

## 2022-05-30 VITALS — BP 138/94 | HR 90 | Ht 64.0 in | Wt 175.2 lb

## 2022-05-30 DIAGNOSIS — M79602 Pain in left arm: Secondary | ICD-10-CM | POA: Diagnosis not present

## 2022-05-30 DIAGNOSIS — R002 Palpitations: Secondary | ICD-10-CM

## 2022-05-30 DIAGNOSIS — I10 Essential (primary) hypertension: Secondary | ICD-10-CM | POA: Diagnosis not present

## 2022-05-30 DIAGNOSIS — E119 Type 2 diabetes mellitus without complications: Secondary | ICD-10-CM | POA: Diagnosis not present

## 2022-05-30 MED ORDER — METOPROLOL SUCCINATE ER 50 MG PO TB24: 75.0000 mg | ORAL_TABLET | Freq: Every day | ORAL | 3 refills | Status: DC
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
## 2022-05-30 NOTE — Patient Instructions (Signed)
Medication Instructions:   STOP DICLOFENAC TABLETS   START: METOPROLOL SUCCINATE '75mg'$  ONCE DAILY- (1.5 TABLETS)  *If you need a refill on your cardiac medications before your next appointment, please call your pharmacy*  Lab Work: None Ordered At This Time.  If you have labs (blood work) drawn today and your tests are completely normal, you will receive your results only by: [place] (if you have MyChart) OR A paper copy in the mail If you have any lab test that is abnormal or we need to change your treatment, we will call you to review the results.  Testing/Procedures: None Ordered At This Time.   Follow-Up: At [facility], you and your health needs are our priority.  As part of our continuing mission to provide you with exceptional heart care, we have created designated Provider Care Teams.  These Care Teams include your primary Cardiologist (physician) and Advanced Practice Providers (APPs -  Physician Assistants and Nurse Practitioners) who all work together to provide you with the care you need, when you need it.  Your next appointment:   1 year(s)  The format for your next appointment:   In Person  Provider:   Sanda Klein, MD     Please check your blood pressure at home daily, write it down.  Call the office or send message via Mychart with the readings in 1 week for Dr. Sallyanne Kuster to review.

## 2022-06-05 ENCOUNTER — Other Ambulatory Visit: Payer: Self-pay | Admitting: Family Medicine

## 2022-06-19 NOTE — Progress Notes (Signed)
This encounter was created in error - please disregard. Pt never answered. Daughter thought she would have. Message left to reschedule

## 2022-06-21 ENCOUNTER — Ambulatory Visit
Admission: RE | Admit: 2022-06-21 | Discharge: 2022-06-21 | Disposition: A | Discharge status: Home / Self Care | Payer: Medicare PPO | Source: Ambulatory Visit | Attending: Family Medicine | Admitting: Family Medicine

## 2022-06-21 DIAGNOSIS — Z1231 Encounter for screening mammogram for malignant neoplasm of breast: Secondary | ICD-10-CM

## 2022-06-27 DIAGNOSIS — E119 Type 2 diabetes mellitus without complications: Secondary | ICD-10-CM | POA: Diagnosis not present

## 2022-06-27 DIAGNOSIS — Z961 Presence of intraocular lens: Secondary | ICD-10-CM | POA: Diagnosis not present

## 2022-06-27 DIAGNOSIS — G43B0 Ophthalmoplegic migraine, not intractable: Secondary | ICD-10-CM | POA: Diagnosis not present

## 2022-06-27 DIAGNOSIS — H43812 Vitreous degeneration, left eye: Secondary | ICD-10-CM | POA: Diagnosis not present

## 2022-06-27 DIAGNOSIS — H02422 Myogenic ptosis of left eyelid: Secondary | ICD-10-CM | POA: Diagnosis not present

## 2022-06-27 DIAGNOSIS — H26493 Other secondary cataract, bilateral: Secondary | ICD-10-CM | POA: Diagnosis not present

## 2022-06-27 DIAGNOSIS — H401131 Primary open-angle glaucoma, bilateral, mild stage: Secondary | ICD-10-CM | POA: Diagnosis not present

## 2022-06-27 LAB — HM DIABETES EYE EXAM

## 2022-07-03 ENCOUNTER — Encounter: Payer: Self-pay | Admitting: Family Medicine

## 2022-08-09 DIAGNOSIS — H26493 Other secondary cataract, bilateral: Secondary | ICD-10-CM | POA: Diagnosis not present

## 2022-08-09 DIAGNOSIS — H43812 Vitreous degeneration, left eye: Secondary | ICD-10-CM | POA: Diagnosis not present

## 2022-08-09 DIAGNOSIS — H401131 Primary open-angle glaucoma, bilateral, mild stage: Secondary | ICD-10-CM | POA: Diagnosis not present

## 2022-08-09 DIAGNOSIS — Z961 Presence of intraocular lens: Secondary | ICD-10-CM | POA: Diagnosis not present

## 2022-08-09 DIAGNOSIS — E119 Type 2 diabetes mellitus without complications: Secondary | ICD-10-CM | POA: Diagnosis not present

## 2022-08-09 DIAGNOSIS — H02422 Myogenic ptosis of left eyelid: Secondary | ICD-10-CM | POA: Diagnosis not present

## 2022-08-09 DIAGNOSIS — G43B0 Ophthalmoplegic migraine, not intractable: Secondary | ICD-10-CM | POA: Diagnosis not present

## 2022-08-23 ENCOUNTER — Ambulatory Visit: Payer: Medicare PPO | Admitting: Family Medicine

## 2022-08-28 ENCOUNTER — Other Ambulatory Visit: Payer: Self-pay | Admitting: Family Medicine

## 2022-08-31 ENCOUNTER — Other Ambulatory Visit: Payer: Self-pay | Admitting: Family Medicine

## 2022-09-14 ENCOUNTER — Other Ambulatory Visit: Payer: Self-pay | Admitting: Family Medicine

## 2022-09-25 ENCOUNTER — Other Ambulatory Visit: Payer: Self-pay | Admitting: Family Medicine

## 2022-10-18 ENCOUNTER — Ambulatory Visit: Payer: Medicare PPO | Admitting: Family

## 2022-10-18 ENCOUNTER — Encounter: Payer: Self-pay | Admitting: Family

## 2022-10-18 VITALS — BP 132/86 | HR 85 | Temp 98.0°F | Ht 64.0 in | Wt 177.4 lb

## 2022-10-18 DIAGNOSIS — J0110 Acute frontal sinusitis, unspecified: Secondary | ICD-10-CM | POA: Diagnosis not present

## 2022-10-18 MED ORDER — AZITHROMYCIN 250 MG PO TABS: ORAL_TABLET | ORAL | 0 refills | Status: AC
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
## 2022-10-19 ENCOUNTER — Other Ambulatory Visit: Payer: Self-pay | Admitting: Family Medicine

## 2022-11-01 ENCOUNTER — Ambulatory Visit
Admission: RE | Admit: 2022-11-01 | Discharge: 2022-11-01 | Disposition: A | Discharge status: Home / Self Care | Payer: Medicare PPO | Source: Ambulatory Visit | Attending: Family Medicine | Admitting: Family Medicine

## 2022-11-01 DIAGNOSIS — Z780 Asymptomatic menopausal state: Secondary | ICD-10-CM | POA: Diagnosis not present

## 2022-11-01 DIAGNOSIS — M8589 Other specified disorders of bone density and structure, multiple sites: Secondary | ICD-10-CM

## 2022-11-08 ENCOUNTER — Ambulatory Visit: Payer: Medicare PPO | Admitting: Family Medicine

## 2022-11-08 ENCOUNTER — Encounter: Payer: Self-pay | Admitting: Family Medicine

## 2022-11-08 VITALS — BP 150/90 | HR 85 | Temp 97.6°F | Ht 64.0 in | Wt 176.6 lb

## 2022-11-08 DIAGNOSIS — I4719 Other supraventricular tachycardia: Secondary | ICD-10-CM | POA: Diagnosis not present

## 2022-11-08 DIAGNOSIS — M47812 Spondylosis without myelopathy or radiculopathy, cervical region: Secondary | ICD-10-CM | POA: Insufficient documentation

## 2022-11-08 DIAGNOSIS — E1159 Type 2 diabetes mellitus with other circulatory complications: Secondary | ICD-10-CM

## 2022-11-08 DIAGNOSIS — M19041 Primary osteoarthritis, right hand: Secondary | ICD-10-CM

## 2022-11-08 DIAGNOSIS — M4722 Other spondylosis with radiculopathy, cervical region: Secondary | ICD-10-CM

## 2022-11-08 DIAGNOSIS — J309 Allergic rhinitis, unspecified: Secondary | ICD-10-CM

## 2022-11-08 DIAGNOSIS — I152 Hypertension secondary to endocrine disorders: Secondary | ICD-10-CM | POA: Diagnosis not present

## 2022-11-08 DIAGNOSIS — E119 Type 2 diabetes mellitus without complications: Secondary | ICD-10-CM

## 2022-11-08 LAB — POCT GLYCOSYLATED HEMOGLOBIN (HGB A1C): Hemoglobin A1C: 6.5 % — AB (ref 4.0–5.6)

## 2022-11-08 MED ORDER — AZELASTINE HCL 0.1 % NA SOLN: 1.0000 | Freq: Two times a day (BID) | NASAL | 11 refills | Status: AC

## 2022-11-08 MED ORDER — METOPROLOL SUCCINATE ER 100 MG PO TB24: 100.0000 mg | ORAL_TABLET | Freq: Every day | ORAL | 3 refills | Status: DC

## 2022-11-08 NOTE — Patient Instructions (Signed)
Please return in 3 months to recheck your blood pressure and diabetes.  Start the astelin nasal spray to see if this helps your allergies  I've increased the metoprolol xl to 100mg  daily. You may take two 50mg  tabs together or pick up the 100mg  tablet at the pharmacy. This should help bring down your blood pressure. Please bring in your home readings for me to your next visit.   If you have any questions or concerns, please don't hesitate to send me a message via MyChart or call the office at [phone]. Thank you for visiting with Sabrina Aguirre today! It's our pleasure caring for you.

## 2022-11-08 NOTE — Progress Notes (Signed)
Subjective  CC:  Chief Complaint  Patient presents with   Sinus Problem    Pt stated that she was advised that she needed to be put on a steriod but she was nosure if that was ok with you and just wanted to F/U before she is put on medication     HPI: Sabrina Aguirre is a 82 y.o. female who presents to the office today for follow up of diabetes and problems listed above in the chief complaint.  Diabetes follow up: Her diabetic control is reported as Unchanged.  Doing well on metformin twice daily.  She denies exertional CP or SOB or symptomatic hypoglycemia. She denies foot sores or paresthesias.  Acute sinusitis follow-up: Thick drainage and painful sinuses have improved with Z-Pak.  However has sneezing, clear rhinorrhea and PND.  He has severe chronic allergies.  On multiple medications. Hypertension: I reviewed notes from cardiology.  Change in blood pressure control thought to be related to anti-inflammatories.  She has stopped this.  However blood pressure is running mildly elevated today was mildly elevated a few weeks ago.  Currently on Toprol-XL 75 mg daily and Micardis HCT 80/25 daily.  Feels well without chest pain.  Compliant with medication. Malaise: Resolved. Shoulder pain: Has resolved.  She does have osteoarthritis in multiple areas Wt Readings from Last 3 Encounters:  11/08/22 176 lb 9.6 oz (80.1 kg)  10/18/22 177 lb 6 oz (80.5 kg)  05/30/22 175 lb 3.2 oz (79.5 kg)    BP Readings from Last 3 Encounters:  11/08/22 (!) 150/90  10/18/22 132/86  05/30/22 (!) 138/94    Assessment  1. Type 2 diabetes mellitus without complication, without long-term current use of insulin ([place])   2. Hypertension associated with diabetes ([place])   3. PAT (paroxysmal atrial tachycardia)   4. Osteoarthritis of spine with radiculopathy, cervical region   5. Chronic allergic rhinitis   6. Primary osteoarthritis of right hand      Plan  Diabetes is currently very well controlled.  Continue  metformin 500 twice daily.  Continue diet. Hypertension with elevated readings: Increase Toprol-XL to 100 mg daily, monitor heart rate.  Continue Micardis HCT.  Patient reports home readings are typically 130s over 80s.  Would like to get to 120s over 70s. Sinusitis: Resolved Chronic allergies: Add Astelin to Flonase and oral antihistamine and Singulair.  Avoid steroids if possible  Follow up: 6 months for complete physical. Orders Placed This Encounter  Procedures   POCT HgB A1C   Meds ordered this encounter  Medications   metoprolol succinate (TOPROL-XL) 100 MG 24 hr tablet    Sig: Take 1 tablet (100 mg total) by mouth daily. Take with or immediately following a meal.    Dispense:  90 tablet    Refill:  3   azelastine (ASTELIN) 0.1 % nasal spray    Sig: Place 1 spray into both nostrils 2 (two) times daily.    Dispense:  30 mL    Refill:  11      Immunization History  Administered Date(s) Administered   Fluad Quad(high Dose 65+) 04/27/2020, 05/18/2021, 05/06/2022   Influenza Split 05/06/2012   Influenza Whole 11/02/2008, 05/10/2013   Influenza, High Dose Seasonal PF 05/12/2014, 05/26/2015, 05/22/2016, 04/25/2019   PFIZER(Purple Top)SARS-COV-2 Vaccination 09/11/2019, 10/02/2019, 05/17/2020, 11/21/2020, 05/25/2021   Pfizer Covid-19 Vaccine Bivalent Booster 77yrs & up 05/06/2022   Pneumococcal Conjugate-13 05/10/2015   Pneumococcal Polysaccharide-23 05/19/2013, 04/25/2019   Tdap 11/13/2011   Zoster Recombinat (Shingrix)  09/01/2017, 01/28/2018   Zoster, Live 05/07/2012    Diabetes Related Lab Review: Lab Results  Component Value Date   HGBA1C 6.5 (A) 11/08/2022   HGBA1C 6.4 (A) 04/04/2022   HGBA1C 6.0 (A) 09/13/2021    Lab Results  Component Value Date   MICROALBUR 10.6 (H) 04/04/2022   Lab Results  Component Value Date   CREATININE 0.75 05/25/2022   BUN 12 05/25/2022   NA 134 (L) 05/25/2022   K 3.4 (L) 05/25/2022   CL 97 (L) 05/25/2022   CO2 28 05/25/2022   Lab  Results  Component Value Date   CHOL 92 04/04/2022   CHOL 86 02/02/2021   CHOL 94 01/23/2020   Lab Results  Component Value Date   HDL 46.90 04/04/2022   HDL 51.00 02/02/2021   HDL 48.50 01/23/2020   Lab Results  Component Value Date   LDLCALC 19 04/04/2022   LDLCALC 13 02/02/2021   LDLCALC 23 01/23/2020   Lab Results  Component Value Date   TRIG 128.0 04/04/2022   TRIG 112.0 02/02/2021   TRIG 113.0 01/23/2020   Lab Results  Component Value Date   CHOLHDL 2 04/04/2022   CHOLHDL 2 02/02/2021   CHOLHDL 2 01/23/2020   No results found for: "LDLDIRECT" The ASCVD Risk score (Arnett DK, et al., 2019) failed to calculate for the following reasons:   The 2019 ASCVD risk score is only valid for ages 9 to 81 I have reviewed the [place], Fam and Soc history. Patient Active Problem List   Diagnosis Date Noted   PAT (paroxysmal atrial tachycardia) 04/04/2022    Priority: High   Type 2 diabetes mellitus without complication, without long-term current use of insulin ([place]) 11/13/2011    Priority: High   Hypertension associated with diabetes ([place]) 02/03/2011    Priority: High   DJD (degenerative joint disease) of cervical spine 11/08/2022    Priority: Medium    Adrenal adenoma     Priority: Medium    Osteopenia 04/04/2011    Priority: Medium     Dexa 03/2020 lowest T = - 1.7; recheck 2 years.  Dexa 10/2022 lowest T = -2.3 at left femur neck from -1.7, change is significant. Osteopenic. Recheck 2 years. Rec exercise and cont D and Ca.     Gastro-esophageal reflux disease without esophagitis 02/03/2011    Priority: Medium    Colon polyps 02/03/2011    Priority: Medium    Migraine without status migrainosus, not intractable 02/03/2011    Priority: Medium    Lipoma of left lower extremity 05/17/2022    Priority: Low   Primary osteoarthritis of right hand 10/12/2016    Priority: Low   Persistent cough 03/19/2015    Priority: Low   Chronic allergic rhinitis 02/03/2011     Priority: Low   Extrinsic asthma 05/17/2022    Social History: Patient  reports that she has quit smoking. Her smoking use included cigarettes. She has never been exposed to tobacco smoke. She has never used smokeless tobacco. She reports that she does not drink alcohol and does not use drugs.  Review of Systems: Ophthalmic: negative for eye pain, loss of vision or double vision Cardiovascular: negative for chest pain Respiratory: negative for SOB or persistent cough Gastrointestinal: negative for abdominal pain Genitourinary: negative for dysuria or gross hematuria MSK: negative for foot lesions Neurologic: negative for weakness or gait disturbance  Objective  Vitals: BP (!) 150/90   Pulse 85   Temp 97.6 F (36.4 C)  Ht 5\' 4"  (1.626 m)   Wt 176 lb 9.6 oz (80.1 kg)   SpO2 97%   BMI 30.31 kg/m  General: well appearing, no acute distress  Psych:  Alert and oriented, normal mood and affect HEENT:  Normocephalic, atraumatic, moist mucous membranes, supple neck  Cardiovascular:  Nl S1 and S2, RRR without murmur, gallop or rub. no edema Respiratory:  Good breath sounds bilaterally, CTAB with normal effort, no rales     Diabetic education: ongoing education regarding chronic disease management for diabetes was given today. We continue to reinforce the ABC's of diabetic management: A1c (<7 or 8 dependent upon patient), tight blood pressure control, and cholesterol management with goal LDL < 100 minimally. We discuss diet strategies, exercise recommendations, medication options and possible side effects. At each visit, we review recommended immunizations and preventive care recommendations for diabetics and stress that good diabetic control can prevent other problems. See below for this patient's data.   Commons side effects, risks, benefits, and alternatives for medications and treatment plan prescribed today were discussed, and the patient expressed understanding of the given  instructions. Patient is instructed to call or message via MyChart if he/she has any questions or concerns regarding our treatment plan. No barriers to understanding were identified. We discussed Red Flag symptoms and signs in detail. Patient expressed understanding regarding what to do in case of urgent or emergency type symptoms.  Medication list was reconciled, printed and provided to the patient in AVS. Patient instructions and summary information was reviewed with the patient as documented in the AVS. This note was prepared with assistance of Dragon voice recognition software. Occasional wrong-word or sound-a-like substitutions may have occurred due to the inherent limitations of voice recognition software

## 2022-11-15 ENCOUNTER — Other Ambulatory Visit: Payer: Self-pay | Admitting: Family Medicine

## 2022-11-16 ENCOUNTER — Telehealth: Payer: Self-pay | Admitting: Family Medicine

## 2022-11-16 ENCOUNTER — Other Ambulatory Visit: Payer: Self-pay

## 2022-11-16 MED ORDER — BENZONATATE 100 MG PO CAPS: 100.0000 mg | ORAL_CAPSULE | Freq: Two times a day (BID) | ORAL | 0 refills | Status: DC | PRN

## 2022-11-16 NOTE — Telephone Encounter (Signed)
  Encourage patient to contact the pharmacy for refills or they can request refills through [facility]:  Please schedule appointment if longer than 1 year  NEXT APPOINTMENT DATE:  MEDICATION:  benzonatate (TESSALON) 100 MG capsule    Is the patient out of medication? YES  PHARMACY: [facility] [address]  [place] [place] [address]   Let patient know to contact pharmacy at the end of the day to make sure medication is ready.  Please notify patient to allow 48-72 hours to process

## 2022-11-16 NOTE — Telephone Encounter (Signed)
Rx sent 

## 2022-11-23 ENCOUNTER — Other Ambulatory Visit: Payer: Self-pay | Admitting: Family Medicine

## 2022-11-30 ENCOUNTER — Telehealth: Payer: Self-pay | Admitting: Family Medicine

## 2022-11-30 NOTE — Telephone Encounter (Deleted)
Called patient to schedule Medicare Annual Wellness Visit (AWV). {SWAWVAPPOINTMENT:[id number]}  Last date of AWV: ***  Please schedule an appointment at any time with ***.  If any questions, please contact me at ***.  Thank you ,  ***

## 2022-11-30 NOTE — Telephone Encounter (Signed)
Called patient to schedule Medicare Annual Wellness Visit (AWV). Left message for patient to call back and schedule Medicare Annual Wellness Visit (AWV). Spoke with the patients daughter, she is going to have her mother call back to schedule AWV.  Last date of AWV: 06/06/2021  Please schedule an appointment at any time with Inetta Fermo, [facility]. Please schedule AWVS with Inetta Fermo, [facility].  If any questions, please contact me at [phone].  Thank you ,  Gabriel Cirri [facility] AWV TEAM Direct Dial [phone]

## 2023-01-02 ENCOUNTER — Telehealth: Payer: Self-pay | Admitting: Family Medicine

## 2023-01-02 NOTE — Telephone Encounter (Signed)
Contacted Sabrina Aguirre to schedule their annual wellness visit. Appointment made for 01/16/2023.  Gabriel Cirri [facility] AWV TEAM Direct Dial [phone]

## 2023-01-10 ENCOUNTER — Encounter: Payer: Self-pay | Admitting: Family Medicine

## 2023-01-10 ENCOUNTER — Ambulatory Visit: Payer: Medicare PPO | Admitting: Family Medicine

## 2023-01-10 VITALS — BP 130/78 | HR 83 | Temp 97.7°F | Ht 64.0 in | Wt 163.6 lb

## 2023-01-10 DIAGNOSIS — Z7984 Long term (current) use of oral hypoglycemic drugs: Secondary | ICD-10-CM | POA: Diagnosis not present

## 2023-01-10 DIAGNOSIS — I4719 Other supraventricular tachycardia: Secondary | ICD-10-CM

## 2023-01-10 DIAGNOSIS — I152 Hypertension secondary to endocrine disorders: Secondary | ICD-10-CM | POA: Diagnosis not present

## 2023-01-10 DIAGNOSIS — J309 Allergic rhinitis, unspecified: Secondary | ICD-10-CM

## 2023-01-10 DIAGNOSIS — E1159 Type 2 diabetes mellitus with other circulatory complications: Secondary | ICD-10-CM

## 2023-01-10 DIAGNOSIS — J0101 Acute recurrent maxillary sinusitis: Secondary | ICD-10-CM | POA: Diagnosis not present

## 2023-01-10 MED ORDER — AMOXICILLIN-POT CLAVULANATE 875-125 MG PO TABS: 1.0000 | ORAL_TABLET | Freq: Two times a day (BID) | ORAL | 0 refills | Status: AC

## 2023-01-10 MED ORDER — MONTELUKAST SODIUM 10 MG PO TABS: 10.0000 mg | ORAL_TABLET | Freq: Every day | ORAL | 3 refills | Status: DC

## 2023-01-10 NOTE — Patient Instructions (Signed)
Please follow up as scheduled for your next visit with me: 02/14/2023   If you have any questions or concerns, please don't hesitate to send me a message via MyChart or call the office at [phone]. Thank you for visiting with [place] today! It's our pleasure caring for you.   Take the antibiotics and singulair. Add mucinex twice a day to thin the mucous. You may use tylenol for the sinus pain.

## 2023-01-10 NOTE — Progress Notes (Signed)
Subjective   CC:  Chief Complaint  Patient presents with   body pain    No appetite, inner Lt thigh swollen/itching along with some burning, neck pain, and hard to swallow    HPI: Sabrina Aguirre is a 82 y.o. female who presents to the office today to address the problems listed above in the chief complaint. Patient reports sinus congestion and pressure with thick drainage, mild nonproductive cough, ear pressure without pain, and malaise.  Symptoms have been present for several weeks.Shedenies high fevers, GI symptoms, shortness of breath. Shehas had sinus infections in the past and this feels similar.  Last was February. Hard to swallow. No appetite.  Patient is a non-smoker.  No history of asthma or COPD. Blood pressure has been controlled.  But does not feel well today.  Mildly elevated.  No chest pain.  No tachycardia. Chronic allergies but ran out of her Singulair several weeks ago.  Has had more sneezing and drainage as noted above. I reviewed the patients updated PMH, FH, and SocHx.    Patient Active Problem List   Diagnosis Date Noted   PAT (paroxysmal atrial tachycardia) 04/04/2022    Priority: High   Type 2 diabetes mellitus without complication, without long-term current use of insulin (HCC) 11/13/2011    Priority: High   Hypertension associated with diabetes (HCC) 02/03/2011    Priority: High   DJD (degenerative joint disease) of cervical spine 11/08/2022    Priority: Medium    Adrenal adenoma     Priority: Medium    Osteopenia 04/04/2011    Priority: Medium    Gastro-esophageal reflux disease without esophagitis 02/03/2011    Priority: Medium    Colon polyps 02/03/2011    Priority: Medium    Migraine without status migrainosus, not intractable 02/03/2011    Priority: Medium    Lipoma of left lower extremity 05/17/2022    Priority: Low   Primary osteoarthritis of right hand 10/12/2016    Priority: Low   Persistent cough 03/19/2015    Priority: Low   Chronic  allergic rhinitis 02/03/2011    Priority: Low   Extrinsic asthma 05/17/2022   Current Meds  Medication Sig   amoxicillin-clavulanate (AUGMENTIN) 875-125 MG tablet Take 1 tablet by mouth 2 (two) times daily for 10 days.   Ascorbic Acid (VITAMIN C) 1000 MG tablet Take 1,000 mg by mouth daily.   aspirin 81 MG tablet Take 81 mg by mouth daily.   azelastine (ASTELIN) 0.1 % nasal spray Place 1 spray into both nostrils 2 (two) times daily.   benzonatate (TESSALON) 100 MG capsule Take 1 capsule (100 mg total) by mouth 2 (two) times daily as needed for cough.   blood glucose meter kit and supplies Dispense based on patient and insurance preference. Use up to two times daily as directed. (FOR ICD-10 E10.9, E11.9).   Calcium Carb-Cholecalciferol (CALCIUM 600/VITAMIN D3 PO) Take 1 tablet by mouth daily.    Cholecalciferol (VITAMIN D3) 1000 units CAPS Take 1,000 Units by mouth daily.   Cinnamon 500 MG TABS Take 1 tablet by mouth daily.   COLLAGEN PO Take 1 packet by mouth daily. power   cyclobenzaprine (FLEXERIL) 10 MG tablet TAKE 1 TABLET BY MOUTH AT BEDTIME AS NEEDED FOR MUSCLE SPASM   docusate sodium (COLACE) 100 MG capsule Take 200 mg by mouth 2 (two) times daily.   fish oil-omega-3 fatty acids 1000 MG capsule Take 1 g by mouth daily.    fluticasone (FLONASE) 50 MCG/ACT nasal  spray Use 1 spray(s) in each nostril once daily   Levocetirizine Dihydrochloride (XYZAL PO) Take by mouth.   MAGNESIUM PO Take by mouth daily.   metFORMIN (GLUCOPHAGE) 500 MG tablet TAKE 1 TABLET BY MOUTH TWICE DAILY WITH A MEAL   metoprolol succinate (TOPROL-XL) 100 MG 24 hr tablet Take 1 tablet (100 mg total) by mouth daily. Take with or immediately following a meal.   Multiple Vitamin (MULTI-VITAMINS) TABS Take 1 tablet by mouth daily.   omeprazole (PRILOSEC) 20 MG capsule Take 1 capsule by mouth once daily   PSYLLIUM PO Take 1 packet by mouth 3 (three) times daily. Pt takes two times daily.   SUMAtriptan (IMITREX) 100 MG  tablet TAKE 1/2 (ONE-HALF) TABLET BY MOUTH EVERY 2 HOURS AS NEEDED   telmisartan-hydrochlorothiazide (MICARDIS HCT) 80-25 MG tablet Take 1 tablet by mouth daily.   vitamin B-12 (CYANOCOBALAMIN) 1000 MCG tablet Take 1,000 mcg by mouth daily.   [DISCONTINUED] montelukast (SINGULAIR) 10 MG tablet Take 1 tablet (10 mg total) by mouth at bedtime.    Review of Systems: Cardiovascular: negative for chest pain Respiratory: negative for SOB or persistent cough Gastrointestinal: negative for abdominal pain Genitourinary: negative for dysuria or gross hematuria  Objective  Vitals: BP 130/78 Comment: at home  Pulse 83   Temp 97.7 F (36.5 C)   Ht 5\' 4"  (1.626 m)   Wt 163 lb 9.6 oz (74.2 kg)   SpO2 97%   BMI 28.08 kg/m  General: no acute distress  Psych:  Alert and oriented, normal mood and affect HEENT:  Normocephalic, atraumatic, TMs with serous effusions or retraction w/o erythema, nasal mucosa is red with purulent drainage, tender maxillary sinus present, OP mild erythematous w/o eudate, supple neck along without lad Cardiovascular:  RRR without murmur or gallop. no peripheral edema Respiratory:  Good breath sounds bilaterally, CTAB with normal respiratory effort Skin:  Warm, no rashes Neurologic:   Mental status is normal. normal gait  Assessment  1. Acute recurrent maxillary sinusitis   2. Chronic allergic rhinitis   3. Hypertension associated with diabetes (HCC)   4. PAT (paroxysmal atrial tachycardia)      Plan   Recurrent sinusitis: History and exam is most consistent with bacterial sinus infection.  Etiology and prognosis discussed with patient.  Recommend antibiotics as ordered below.  Patient to complete course of antibiotics, use supportive medications like mucolytics and decongestants as needed.  May use Tylenol or Advil if needed.  Symptoms should improve over the next 2 weeks.  Patient will return or call if symptoms persist or worsen. Restart Singulair for chronic  allergies in addition to other allergy medicines as listed on medication list Blood pressure has been well-controlled.  Continue current medications including ARB, diuretic and, beta-blocker.  Recheck in June at her follow-up visit to ensure good control. Avoid decongestants and NSAIDs  Follow up: As scheduled 02/14/2023   Commons side effects, risks, benefits, and alternatives for medications and treatment plan prescribed today were discussed, and the patient expressed understanding of the given instructions. Patient is instructed to call or message via MyChart if he/she has any questions or concerns regarding our treatment plan. No barriers to understanding were identified. We discussed Red Flag symptoms and signs in detail. Patient expressed understanding regarding what to do in case of urgent or emergency type symptoms.  Medication list was reconciled, printed and provided to the patient in AVS. Patient instructions and summary information was reviewed with the patient as documented in the AVS. This  note was prepared with assistance of Conservation officer, historic buildings. Occasional wrong-word or sound-a-like substitutions may have occurred due to the inherent limitations of voice recognition software  No orders of the defined types were placed in this encounter.  Meds ordered this encounter  Medications   amoxicillin-clavulanate (AUGMENTIN) 875-125 MG tablet    Sig: Take 1 tablet by mouth 2 (two) times daily for 10 days.    Dispense:  20 tablet    Refill:  0   montelukast (SINGULAIR) 10 MG tablet    Sig: Take 1 tablet (10 mg total) by mouth at bedtime.    Dispense:  90 tablet    Refill:  3

## 2023-01-16 ENCOUNTER — Ambulatory Visit (INDEPENDENT_AMBULATORY_CARE_PROVIDER_SITE_OTHER): Payer: Medicare PPO

## 2023-01-16 VITALS — Wt 163.0 lb

## 2023-01-16 DIAGNOSIS — Z0000 Encounter for general adult medical examination without abnormal findings: Secondary | ICD-10-CM
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
## 2023-01-16 NOTE — Patient Instructions (Signed)
Sabrina Aguirre , Thank you for taking time to come for your Medicare Wellness Visit. I appreciate your ongoing commitment to your health goals. Please review the following plan we discussed and let me know if I can assist you in the future.   These are the goals we discussed:  Goals      Patient Stated     Stay healthy and care for her dog     Patient Stated     Stay healthy and live long         This is a list of the screening recommended for you and due dates:  Health Maintenance  Topic Date Due   Medicare Annual Wellness Visit  06/06/2022   COVID-19 Vaccine (7 - 2023-24 season) 01/26/2023*   Flu Shot  03/22/2023   Yearly kidney health urinalysis for diabetes  04/05/2023   Complete foot exam   04/05/2023   Hemoglobin A1C  05/11/2023   Yearly kidney function blood test for diabetes  05/26/2023   Eye exam for diabetics  06/28/2023   DEXA scan (bone density measurement)  10/31/2024   Pneumonia Vaccine  Completed   Zoster (Shingles) Vaccine  Completed   HPV Vaccine  Aged Out   DTaP/Tdap/Td vaccine  Discontinued  *Topic was postponed. The date shown is not the original due date.    Advanced directives: Advance directive discussed with you today. Even though you declined this today please call our office should you change your mind and we can give you the proper paperwork for you to fill out.  Conditions/risks identified: feel better  Next appointment: Follow up in one year for your annual wellness visit    Preventive Care 65 Years and Older, Female Preventive care refers to lifestyle choices and visits with your health care provider that can promote health and wellness. What does preventive care include? A yearly physical exam. This is also called an annual well check. Dental exams once or twice a year. Routine eye exams. Ask your health care provider how often you should have your eyes checked. Personal lifestyle choices, including: Daily care of your teeth and gums. Regular  physical activity. Eating a healthy diet. Avoiding tobacco and drug use. Limiting alcohol use. Practicing safe sex. Taking low-dose aspirin every day. Taking vitamin and mineral supplements as recommended by your health care provider. What happens during an annual well check? The services and screenings done by your health care provider during your annual well check will depend on your age, overall health, lifestyle risk factors, and family history of disease. Counseling  Your health care provider may ask you questions about your: Alcohol use. Tobacco use. Drug use. Emotional well-being. Home and relationship well-being. Sexual activity. Eating habits. History of falls. Memory and ability to understand (cognition). Work and work Astronomer. Reproductive health. Screening  You may have the following tests or measurements: Height, weight, and BMI. Blood pressure. Lipid and cholesterol levels. These may be checked every 5 years, or more frequently if you are over 74 years old. Skin check. Lung cancer screening. You may have this screening every year starting at age 30 if you have a 30-pack-year history of smoking and currently smoke or have quit within the past 15 years. Fecal occult blood test (FOBT) of the stool. You may have this test every year starting at age 20. Flexible sigmoidoscopy or colonoscopy. You may have a sigmoidoscopy every 5 years or a colonoscopy every 10 years starting at age 2. Hepatitis C blood test. Hepatitis B  blood test. Sexually transmitted disease (STD) testing. Diabetes screening. This is done by checking your blood sugar (glucose) after you have not eaten for a while (fasting). You may have this done every 1-3 years. Bone density scan. This is done to screen for osteoporosis. You may have this done starting at age 88. Mammogram. This may be done every 1-2 years. Talk to your health care provider about how often you should have regular mammograms. Talk  with your health care provider about your test results, treatment options, and if necessary, the need for more tests. Vaccines  Your health care provider may recommend certain vaccines, such as: Influenza vaccine. This is recommended every year. Tetanus, diphtheria, and acellular pertussis (Tdap, Td) vaccine. You may need a Td booster every 10 years. Zoster vaccine. You may need this after age 66. Pneumococcal 13-valent conjugate (PCV13) vaccine. One dose is recommended after age 58. Pneumococcal polysaccharide (PPSV23) vaccine. One dose is recommended after age 63. Talk to your health care provider about which screenings and vaccines you need and how often you need them. This information is not intended to replace advice given to you by your health care provider. Make sure you discuss any questions you have with your health care provider. Document Released: 09/03/2015 Document Revised: 04/26/2016 Document Reviewed: 06/08/2015 Elsevier Interactive Patient Education  2017 [employer].  Fall Prevention in the Home Falls can cause injuries. They can happen to people of all ages. There are many things you can do to make your home safe and to help prevent falls. What can I do on the outside of my home? Regularly fix the edges of walkways and driveways and fix any cracks. Remove anything that might make you trip as you walk through a door, such as a raised step or threshold. Trim any bushes or trees on the path to your home. Use bright outdoor lighting. Clear any walking paths of anything that might make someone trip, such as rocks or tools. Regularly check to see if handrails are loose or broken. Make sure that both sides of any steps have handrails. Any raised decks and porches should have guardrails on the edges. Have any leaves, snow, or ice cleared regularly. Use sand or salt on walking paths during winter. Clean up any spills in your garage right away. This includes oil or grease  spills. What can I do in the bathroom? Use night lights. Install grab bars by the toilet and in the tub and shower. Do not use towel bars as grab bars. Use non-skid mats or decals in the tub or shower. If you need to sit down in the shower, use a plastic, non-slip stool. Keep the floor dry. Clean up any water that spills on the floor as soon as it happens. Remove soap buildup in the tub or shower regularly. Attach bath mats securely with double-sided non-slip rug tape. Do not have throw rugs and other things on the floor that can make you trip. What can I do in the bedroom? Use night lights. Make sure that you have a light by your bed that is easy to reach. Do not use any sheets or blankets that are too big for your bed. They should not hang down onto the floor. Have a firm chair that has side arms. You can use this for support while you get dressed. Do not have throw rugs and other things on the floor that can make you trip. What can I do in the kitchen? Clean up any spills  right away. Avoid walking on wet floors. Keep items that you use a lot in easy-to-reach places. If you need to reach something above you, use a strong step stool that has a grab bar. Keep electrical cords out of the way. Do not use floor polish or wax that makes floors slippery. If you must use wax, use non-skid floor wax. Do not have throw rugs and other things on the floor that can make you trip. What can I do with my stairs? Do not leave any items on the stairs. Make sure that there are handrails on both sides of the stairs and use them. Fix handrails that are broken or loose. Make sure that handrails are as long as the stairways. Check any carpeting to make sure that it is firmly attached to the stairs. Fix any carpet that is loose or worn. Avoid having throw rugs at the top or bottom of the stairs. If you do have throw rugs, attach them to the floor with carpet tape. Make sure that you have a light switch at the  top of the stairs and the bottom of the stairs. If you do not have them, ask someone to add them for you. What else can I do to help prevent falls? Wear shoes that: Do not have high heels. Have rubber bottoms. Are comfortable and fit you well. Are closed at the toe. Do not wear sandals. If you use a stepladder: Make sure that it is fully opened. Do not climb a closed stepladder. Make sure that both sides of the stepladder are locked into place. Ask someone to hold it for you, if possible. Clearly mark and make sure that you can see: Any grab bars or handrails. First and last steps. Where the edge of each step is. Use tools that help you move around (mobility aids) if they are needed. These include: Canes. Walkers. Scooters. Crutches. Turn on the lights when you go into a dark area. Replace any light bulbs as soon as they burn out. Set up your furniture so you have a clear path. Avoid moving your furniture around. If any of your floors are uneven, fix them. If there are any pets around you, be aware of where they are. Review your medicines with your doctor. Some medicines can make you feel dizzy. This can increase your chance of falling. Ask your doctor what other things that you can do to help prevent falls. This information is not intended to replace advice given to you by your health care provider. Make sure you discuss any questions you have with your health care provider. Document Released: 06/03/2009 Document Revised: 01/13/2016 Document Reviewed: 09/11/2014 Elsevier Interactive Patient Education  2017 [employer].

## 2023-01-22 ENCOUNTER — Emergency Department (HOSPITAL_BASED_OUTPATIENT_CLINIC_OR_DEPARTMENT_OTHER): Payer: Medicare PPO

## 2023-01-22 ENCOUNTER — Emergency Department (HOSPITAL_COMMUNITY): Payer: Medicare PPO

## 2023-01-22 ENCOUNTER — Other Ambulatory Visit (HOSPITAL_BASED_OUTPATIENT_CLINIC_OR_DEPARTMENT_OTHER): Payer: Self-pay

## 2023-01-22 ENCOUNTER — Emergency Department (HOSPITAL_BASED_OUTPATIENT_CLINIC_OR_DEPARTMENT_OTHER): Payer: Medicare PPO | Admitting: Radiology

## 2023-01-22 ENCOUNTER — Encounter: Payer: Self-pay | Admitting: Internal Medicine

## 2023-01-22 ENCOUNTER — Emergency Department (HOSPITAL_BASED_OUTPATIENT_CLINIC_OR_DEPARTMENT_OTHER)
Admission: EM | Admit: 2023-01-22 | Discharge: 2023-01-22 | Disposition: A | Discharge status: Home / Self Care | Payer: Medicare PPO | Attending: Emergency Medicine | Admitting: Emergency Medicine

## 2023-01-22 ENCOUNTER — Encounter (HOSPITAL_BASED_OUTPATIENT_CLINIC_OR_DEPARTMENT_OTHER): Payer: Self-pay | Admitting: Emergency Medicine

## 2023-01-22 ENCOUNTER — Other Ambulatory Visit: Payer: Self-pay

## 2023-01-22 ENCOUNTER — Telehealth: Payer: Self-pay | Admitting: Family Medicine

## 2023-01-22 ENCOUNTER — Ambulatory Visit (INDEPENDENT_AMBULATORY_CARE_PROVIDER_SITE_OTHER): Payer: Medicare PPO | Admitting: Internal Medicine

## 2023-01-22 VITALS — BP 111/75 | HR 77 | Temp 97.8°F | Ht 64.0 in | Wt 158.0 lb

## 2023-01-22 DIAGNOSIS — E860 Dehydration: Secondary | ICD-10-CM | POA: Insufficient documentation

## 2023-01-22 DIAGNOSIS — R4182 Altered mental status, unspecified: Secondary | ICD-10-CM

## 2023-01-22 DIAGNOSIS — R42 Dizziness and giddiness: Secondary | ICD-10-CM

## 2023-01-22 DIAGNOSIS — R410 Disorientation, unspecified: Secondary | ICD-10-CM

## 2023-01-22 DIAGNOSIS — Z7982 Long term (current) use of aspirin: Secondary | ICD-10-CM | POA: Diagnosis not present

## 2023-01-22 DIAGNOSIS — H7091 Unspecified mastoiditis, right ear: Secondary | ICD-10-CM

## 2023-01-22 DIAGNOSIS — Z79899 Other long term (current) drug therapy: Secondary | ICD-10-CM | POA: Diagnosis not present

## 2023-01-22 DIAGNOSIS — E1165 Type 2 diabetes mellitus with hyperglycemia: Secondary | ICD-10-CM | POA: Insufficient documentation

## 2023-01-22 DIAGNOSIS — I10 Essential (primary) hypertension: Secondary | ICD-10-CM | POA: Insufficient documentation

## 2023-01-22 DIAGNOSIS — N200 Calculus of kidney: Secondary | ICD-10-CM | POA: Diagnosis not present

## 2023-01-22 DIAGNOSIS — R270 Ataxia, unspecified: Secondary | ICD-10-CM | POA: Diagnosis not present

## 2023-01-22 DIAGNOSIS — Z7984 Long term (current) use of oral hypoglycemic drugs: Secondary | ICD-10-CM | POA: Diagnosis not present

## 2023-01-22 DIAGNOSIS — K5730 Diverticulosis of large intestine without perforation or abscess without bleeding: Secondary | ICD-10-CM | POA: Diagnosis not present

## 2023-01-22 DIAGNOSIS — R944 Abnormal results of kidney function studies: Secondary | ICD-10-CM | POA: Diagnosis not present

## 2023-01-22 DIAGNOSIS — R739 Hyperglycemia, unspecified: Secondary | ICD-10-CM

## 2023-01-22 DIAGNOSIS — H9209 Otalgia, unspecified ear: Secondary | ICD-10-CM | POA: Diagnosis not present

## 2023-01-22 DIAGNOSIS — R059 Cough, unspecified: Secondary | ICD-10-CM | POA: Diagnosis not present

## 2023-01-22 LAB — COMPREHENSIVE METABOLIC PANEL
ALT: 20 U/L (ref 0–44)
AST: 22 U/L (ref 15–41)
Albumin: 4.4 g/dL (ref 3.5–5.0)
Alkaline Phosphatase: 75 U/L (ref 38–126)
Anion gap: 19 — ABNORMAL HIGH (ref 5–15)
BUN: 21 mg/dL (ref 8–23)
CO2: 24 mmol/L (ref 22–32)
Calcium: 10.5 mg/dL — ABNORMAL HIGH (ref 8.9–10.3)
Chloride: 88 mmol/L — ABNORMAL LOW (ref 98–111)
Creatinine, Ser: 1.07 mg/dL — ABNORMAL HIGH (ref 0.44–1.00)
GFR, Estimated: 52 mL/min — ABNORMAL LOW (ref >60)
Glucose, Bld: 472 mg/dL — ABNORMAL HIGH (ref 70–99)
Potassium: 3.6 mmol/L (ref 3.5–5.1)
Sodium: 131 mmol/L — ABNORMAL LOW (ref 135–145)
Total Bilirubin: 1 mg/dL (ref 0.3–1.2)
Total Protein: 7.6 g/dL (ref 6.5–8.1)

## 2023-01-22 LAB — CBC WITH DIFFERENTIAL/PLATELET
Abs Immature Granulocytes: 0.04 10*3/uL (ref 0.00–0.07)
Basophils Absolute: 0 10*3/uL (ref 0.0–0.1)
Basophils Relative: 1 %
Eosinophils Absolute: 0.1 10*3/uL (ref 0.0–0.5)
Eosinophils Relative: 1 %
HCT: 42 % (ref 36.0–46.0)
Hemoglobin: 14.6 g/dL (ref 12.0–15.0)
Immature Granulocytes: 1 %
Lymphocytes Relative: 24 %
Lymphs Abs: 2 10*3/uL (ref 0.7–4.0)
MCH: 29 pg (ref 26.0–34.0)
MCHC: 34.8 g/dL (ref 30.0–36.0)
MCV: 83.3 fL (ref 80.0–100.0)
Monocytes Absolute: 0.6 10*3/uL (ref 0.1–1.0)
Monocytes Relative: 8 %
Neutro Abs: 5.7 10*3/uL (ref 1.7–7.7)
Neutrophils Relative %: 65 %
Platelets: 231 10*3/uL (ref 150–400)
RBC: 5.04 MIL/uL (ref 3.87–5.11)
RDW: 12.1 % (ref 11.5–15.5)
WBC: 8.5 10*3/uL (ref 4.0–10.5)
nRBC: 0 % (ref 0.0–0.2)

## 2023-01-22 LAB — URINALYSIS, ROUTINE W REFLEX MICROSCOPIC
Bacteria, UA: NONE SEEN
Bilirubin Urine: NEGATIVE
Glucose, UA: >1000 mg/dL — AB
Hgb urine dipstick: NEGATIVE
Ketones, ur: >80 mg/dL — AB
Leukocytes,Ua: NEGATIVE
Nitrite: NEGATIVE
Protein, ur: NEGATIVE mg/dL
Specific Gravity, Urine: 1.023 (ref 1.005–1.030)
pH: 5.5 (ref 5.0–8.0)

## 2023-01-22 LAB — TROPONIN I (HIGH SENSITIVITY)
Troponin I (High Sensitivity): 4 ng/L (ref <18)
Troponin I (High Sensitivity): 5 ng/L (ref <18)

## 2023-01-22 LAB — CBG MONITORING, ED
Glucose-Capillary: 481 mg/dL — ABNORMAL HIGH (ref 70–99)
Glucose-Capillary: 370 mg/dL — ABNORMAL HIGH (ref 70–99)
Glucose-Capillary: 228 mg/dL — ABNORMAL HIGH (ref 70–99)

## 2023-01-22 MED ORDER — INSULIN ASPART 100 UNIT/ML IJ SOLN
5.0000 [IU] | INTRAMUSCULAR | Status: AC
  Administered 2023-01-22: 5 [IU] via SUBCUTANEOUS

## 2023-01-22 MED ORDER — MECLIZINE HCL 25 MG PO TABS
25.0000 mg | ORAL_TABLET | Freq: Three times a day (TID) | ORAL | 0 refills | Status: DC | PRN
  Filled 2023-01-22: qty 30, 10d supply, fill #0

## 2023-01-22 MED ORDER — INSULIN ASPART 100 UNIT/ML IJ SOLN
4.0000 [IU] | INTRAMUSCULAR | Status: AC
  Administered 2023-01-22: 4 [IU] via SUBCUTANEOUS

## 2023-01-22 MED ORDER — LACTATED RINGERS IV BOLUS
1000.0000 mL | Freq: Once | INTRAVENOUS | Status: AC
  Administered 2023-01-22: 1000 mL via INTRAVENOUS

## 2023-01-22 MED ORDER — AMOXICILLIN 500 MG PO CAPS
500.0000 mg | ORAL_CAPSULE | Freq: Three times a day (TID) | ORAL | 0 refills | Status: DC
  Filled 2023-01-22: qty 21, 7d supply, fill #0
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
## 2023-01-22 NOTE — Telephone Encounter (Signed)
Caller states: - Since last OV pt has been having trouble walking, talking, and appearing "loopy" - States believes symptoms caused by recent medication given to pt  - Symptom onset x 1 week   After consulting Hasna, clinical head, pt has been scheduled to see Dr. Jon Billings on 6/3 @ 11am for eval of symptoms.

## 2023-01-22 NOTE — Telephone Encounter (Signed)
Noted  

## 2023-01-22 NOTE — ED Provider Notes (Signed)
[place] EMERGENCY DEPARTMENT AT [facility] Provider Note   CSN: [id number] Arrival date & time: 01/22/23  1211     History  Chief Complaint  Patient presents with   Altered Mental Status    Sabrina Aguirre is a 82 y.o. female.  82 year old female with history of DM2, hypertension, and migraines who presents to the emergency department with multiple complaints.  Over the past month patient reports that she has been having a headache, nasal drip, dry cough, and dizziness.  Says that the dizziness is both a lightheaded sensation and occasionally a spinning sensation that is worse when she stands.  Says she has been walking around less than usual.  Her daughter says that she has had some difficulty speaking and difficulty collecting her thoughts recently as well.  Patient says that she feels dehydrated.  Says that she has not had any fevers recently.  Was treated with Augmentin for sinusitis with her last dose on 01/20/2023.  Saw her primary doctor today who noticed that she had left greater than right ear effusions and was concerned for possible right-sided mastitis.  Only takes metformin for her diabetes.       Home Medications Prior to Admission medications   Medication Sig Start Date End Date Taking? Authorizing Provider  Ascorbic Acid (VITAMIN C) 1000 MG tablet Take 1,000 mg by mouth daily.    [provider]  aspirin 81 MG tablet Take 81 mg by mouth daily.    [provider]  azelastine (ASTELIN) 0.1 % nasal spray Place 1 spray into both nostrils 2 (two) times daily. 11/08/22   Willow Ora, MD  benzonatate (TESSALON) 100 MG capsule Take 1 capsule (100 mg total) by mouth 2 (two) times daily as needed for cough. 11/16/22   Willow Ora, MD  blood glucose meter kit and supplies Dispense based on patient and insurance preference. Use up to two times daily as directed. (FOR ICD-10 E10.9, E11.9). 06/07/21   Willow Ora, MD  Calcium Carb-Cholecalciferol  (CALCIUM 600/VITAMIN D3 PO) Take 1 tablet by mouth daily.     [provider]  Cholecalciferol (VITAMIN D3) 1000 units CAPS Take 1,000 Units by mouth daily.    [provider]  Cinnamon 500 MG TABS Take 1 tablet by mouth daily.    [provider]  COLLAGEN PO Take 1 packet by mouth daily. power    [provider]  cyclobenzaprine (FLEXERIL) 10 MG tablet TAKE 1 TABLET BY MOUTH AT BEDTIME AS NEEDED FOR MUSCLE SPASM 09/14/22   Willow Ora, MD  docusate sodium (COLACE) 100 MG capsule Take 200 mg by mouth 2 (two) times daily.    [provider]  fish oil-omega-3 fatty acids 1000 MG capsule Take 1 g by mouth daily.     [provider]  fluticasone Aleda Grana) 50 MCG/ACT nasal spray Use 1 spray(s) in each nostril once daily 10/19/22   Willow Ora, MD  latanoprost (XALATAN) 0.005 % ophthalmic solution SMARTSIG:In Eye(s) 11/27/22   [provider]  Levocetirizine Dihydrochloride (XYZAL PO) Take by mouth.    [provider]  MAGNESIUM PO Take by mouth daily.    [provider]  metFORMIN (GLUCOPHAGE) 500 MG tablet TAKE 1 TABLET BY MOUTH TWICE DAILY WITH A MEAL 05/04/22   Willow Ora, MD  metoprolol succinate (TOPROL-XL) 100 MG 24 hr tablet Take 1 tablet (100 mg total) by mouth daily. Take with or immediately following a meal. 11/08/22  Willow Ora, MD  montelukast (SINGULAIR) 10 MG tablet Take 1 tablet (10 mg total) by mouth at bedtime. 01/10/23   Willow Ora, MD  Multiple Vitamin (MULTI-VITAMINS) TABS Take 1 tablet by mouth daily.    [provider]  omeprazole (PRILOSEC) 20 MG capsule Take 1 capsule by mouth once daily 11/23/22   Willow Ora, MD  PSYLLIUM PO Take 1 packet by mouth 3 (three) times daily. Pt takes two times daily.    [provider]  SUMAtriptan (IMITREX) 100 MG tablet TAKE 1/2 (ONE-HALF) TABLET BY MOUTH EVERY 2 HOURS AS NEEDED 09/25/22   Willow Ora, MD   telmisartan-hydrochlorothiazide (MICARDIS HCT) 80-25 MG tablet Take 1 tablet by mouth daily. 05/17/22   Willow Ora, MD  vitamin B-12 (CYANOCOBALAMIN) 1000 MCG tablet Take 1,000 mcg by mouth daily.    [provider]  nortriptyline (PAMELOR) 10 MG capsule Take 1 capsule (10 mg total) by mouth at bedtime. 03/08/11 11/13/11  Ronnald Nian, MD  olmesartan (BENICAR) 40 MG tablet Take 40 mg by mouth daily.    11/13/11  [provider]      Allergies    Pollen extract    Review of Systems   Review of Systems  Physical Exam Updated Vital Signs BP 106/71   Pulse 72   Temp 98.1 F (36.7 C) (Oral)   Resp 18   SpO2 96%  Physical Exam Vitals and nursing note reviewed.  Constitutional:      General: She is not in acute distress.    Appearance: She is well-developed.  HENT:     Head: Normocephalic and atraumatic.     Comments: No significant tenderness to palpation of either mastoid process.  No erythema.  No anterior rotation of the ears.    Right Ear: Tympanic membrane, ear canal and external ear normal.     Left Ear: Tympanic membrane, ear canal and external ear normal.     Nose: Nose normal.  Eyes:     Extraocular Movements: Extraocular movements intact.     Conjunctiva/sclera: Conjunctivae normal.     Pupils: Pupils are equal, round, and reactive to light.  Cardiovascular:     Rate and Rhythm: Normal rate and regular rhythm.     Heart sounds: No murmur heard. Pulmonary:     Effort: Pulmonary effort is normal. No respiratory distress.     Breath sounds: Normal breath sounds.  Musculoskeletal:     Cervical back: Normal range of motion and neck supple.     Right lower leg: No edema.     Left lower leg: No edema.  Skin:    General: Skin is warm and dry.  Neurological:     Mental Status: She is alert.     Comments: MENTAL STATUS: AAOx3 CRANIAL NERVES: II: Pupils equal and reactive 4 mm BL, no RAPD, no VF deficits III, IV, VI: EOM intact, no gaze preference  or deviation, no nystagmus. V: normal sensation to light touch in V1, V2, and V3 segments bilaterally VII: no facial weakness or asymmetry, no nasolabial fold flattening VIII: normal hearing to speech and finger friction IX, X: normal palatal elevation, no uvular deviation XI: 5/5 head turn and 5/5 shoulder shrug bilaterally XII: midline tongue protrusion MOTOR: 5/5 strength in R shoulder flexion, elbow flexion and extension, and grip strength. 5/5 strength in L shoulder flexion, elbow flexion and extension, and grip strength.  5/5 strength in R hip and knee flexion, knee extension, ankle plantar  and dorsiflexion. 5/5 strength in L hip and knee flexion, knee extension, ankle plantar and dorsiflexion. SENSORY: Normal sensation to light touch in all extremities COORD: Normal finger to nose and heel to shin, no tremor, no dysmetria STATION: no truncal ataxia Shuffling gait that occasionally requires assistance. Pt walks with a cane sometimes at home so unclear if this is baseline  Psychiatric:        Mood and Affect: Mood normal.     ED Results / Procedures / Treatments   Labs (all labs ordered are listed, but only abnormal results are displayed) Labs Reviewed  COMPREHENSIVE METABOLIC PANEL - Abnormal; Notable for the following components:      Result Value   Sodium 131 (*)    Chloride 88 (*)    Glucose, Bld 472 (*)    Creatinine, Ser 1.07 (*)    Calcium 10.5 (*)    GFR, Estimated 52 (*)    Anion gap 19 (*)    All other components within normal limits  URINALYSIS, ROUTINE W REFLEX MICROSCOPIC - Abnormal; Notable for the following components:   Glucose, UA >1,000 (*)    Ketones, ur >80 (*)    All other components within normal limits  CBG MONITORING, ED - Abnormal; Notable for the following components:   Glucose-Capillary 481 (*)    All other components within normal limits  CBG MONITORING, ED - Abnormal; Notable for the following components:   Glucose-Capillary 370 (*)    All  other components within normal limits  CBC WITH DIFFERENTIAL/PLATELET  TROPONIN I (HIGH SENSITIVITY)  TROPONIN I (HIGH SENSITIVITY)    EKG EKG Interpretation  Date/Time:  Monday January 22 2023 14:34:38 EDT Ventricular Rate:  80 PR Interval:  195 QRS Duration: 88 QT Interval:  417 QTC Calculation: 482 R Axis:   7 Text Interpretation: Sinus rhythm Atrial premature complexes Low voltage, precordial leads Borderline repolarization abnormality Confirmed by Ernie Avena ([id number]) on 01/22/2023 4:51:46 PM  Radiology DG Chest 2 View  Result Date: 01/22/2023 CLINICAL DATA:  Cough for 1 month EXAM: CHEST - 2 VIEW COMPARISON:  Chest x-ray 05/25/2022 FINDINGS: There are findings concerning for air under the right hemidiaphragm. Mild elevation of the right hemidiaphragm is unchanged. The lungs are clear. There is no pleural effusion or pneumothorax. Cardiomediastinal silhouette is within normal limits. No acute fractures are seen. IMPRESSION: 1. Findings concerning for free air under the right hemidiaphragm. Please correlate clinically. This can be further evaluated with CT of the abdomen or pelvis or decubitus view of the abdomen with abdominal series. 2. No acute cardiopulmonary process. Electronically Signed   By: Darliss Cheney M.D.   On: 01/22/2023 15:47   CT Head Wo Contrast  Result Date: 01/22/2023 CLINICAL DATA:  dizziness, ear pain; ear pain, effusion on exam, dizziness. Concern for right-sided mastoiditis. EXAM: CT HEAD AND TEMPORAL BONES WITHOUT CONTRAST TECHNIQUE: Contiguous axial images were obtained from the base of the skull through the vertex without contrast. Multidetector CT imaging of the temporal bones was performed using the standard protocol without intravenous contrast. RADIATION DOSE REDUCTION: This exam was performed according to the departmental dose-optimization program which includes automated exposure control, adjustment of the mA and/or kV according to patient size and/or use of  iterative reconstruction technique. COMPARISON:  Head CT 04/02/2022. FINDINGS: CT HEAD FINDINGS Brain: No acute intracranial hemorrhage. Gray-white differentiation is preserved. No hydrocephalus or extra-axial collection. No mass effect or midline shift. Vascular: No hyperdense vessel or unexpected calcification. Skull: No calvarial fracture or  suspicious bone lesion. Skull base is unremarkable. Sinuses/Orbits: Unremarkable. Other: None. CT TEMPORAL BONES FINDINGS External auditory canals are unremarkable. Tympanic membranes are thin. Scutum is sharp bilaterally. Mastoids and middle ear cavities are well aerated. Ossicles are intact. Cochlea, vestibule and semicircular canals are normal bilaterally. Vestibular aqueducts are not enlarged. IAC's are unremarkable. The facial nerves follow a normal course and are well covered with bone. Carotid canals, sigmoid plates and jugular bulbs are unremarkable. IMPRESSION: 1. No acute intracranial abnormality. 2. Unremarkable appearance of the temporal bones. No evidence of mastoiditis. Electronically Signed   By: Orvan Falconer M.D.   On: 01/22/2023 15:46   CT Temporal Bones Wo Contrast  Result Date: 01/22/2023 CLINICAL DATA:  dizziness, ear pain; ear pain, effusion on exam, dizziness. Concern for right-sided mastoiditis. EXAM: CT HEAD AND TEMPORAL BONES WITHOUT CONTRAST TECHNIQUE: Contiguous axial images were obtained from the base of the skull through the vertex without contrast. Multidetector CT imaging of the temporal bones was performed using the standard protocol without intravenous contrast. RADIATION DOSE REDUCTION: This exam was performed according to the departmental dose-optimization program which includes automated exposure control, adjustment of the mA and/or kV according to patient size and/or use of iterative reconstruction technique. COMPARISON:  Head CT 04/02/2022. FINDINGS: CT HEAD FINDINGS Brain: No acute intracranial hemorrhage. Gray-white  differentiation is preserved. No hydrocephalus or extra-axial collection. No mass effect or midline shift. Vascular: No hyperdense vessel or unexpected calcification. Skull: No calvarial fracture or suspicious bone lesion. Skull base is unremarkable. Sinuses/Orbits: Unremarkable. Other: None. CT TEMPORAL BONES FINDINGS External auditory canals are unremarkable. Tympanic membranes are thin. Scutum is sharp bilaterally. Mastoids and middle ear cavities are well aerated. Ossicles are intact. Cochlea, vestibule and semicircular canals are normal bilaterally. Vestibular aqueducts are not enlarged. IAC's are unremarkable. The facial nerves follow a normal course and are well covered with bone. Carotid canals, sigmoid plates and jugular bulbs are unremarkable. IMPRESSION: 1. No acute intracranial abnormality. 2. Unremarkable appearance of the temporal bones. No evidence of mastoiditis. Electronically Signed   By: Orvan Falconer M.D.   On: 01/22/2023 15:46    Procedures Procedures    Medications Ordered in ED Medications  lactated ringers bolus 1,000 mL (0 mLs Intravenous Stopped 01/22/23 1551)  insulin aspart (novoLOG) injection 5 Units (5 Units Subcutaneous Given 01/22/23 1435)  insulin aspart (novoLOG) injection 4 Units (4 Units Subcutaneous Given 01/22/23 1644)    ED Course/ Medical Decision Making/ A&P Clinical Course as of 01/22/23 1759  Mon Jan 22, 2023  1415 Creatinine(!): 1.07 Baseline 0.7 [RP]  1658 Signed out to Dr Karene Fry. [RP]    Clinical Course User Index [RP] Rondel Baton, MD                             Medical Decision Making Amount and/or Complexity of Data Reviewed Labs: ordered. Decision-making details documented in ED Course. Radiology: ordered.  Risk Prescription drug management.   Sabrina Aguirre is a 81 y.o. female with comorbidities that complicate the patient evaluation including DM2, hypertension, and migraines who presents to the emergency department with  dizziness, nasal drip, dry cough, and headache  Initial Ddx:  URI, sinusitis, pneumonia, otitis media, vestibular neuritis, labyrinthitis, dehydration, DKA, MI, UTI  MDM:  Patient family reports that she is confused appears to be alert and oriented at this time.  Patient symptoms not clearly consistent with vertigo or presyncope.  The patient may likely be recovering from  a URI or even sinusitis causing her headache and other constellation of symptoms.  I do not appreciate any significant effusion or signs of otitis on exam so we will hold off on empirically treating with antibiotics.  Since her primary doctor was concerned about this we will obtain a CT of the head and temporal bones to further evaluate.  The patient is likely dehydrated from her poorly managed diabetes as her blood sugar was in the 400s here in the emergency department.  Will check for diabetic emergency such as DKA.  Will also give her fluids and insulin.  Considered stroke given the patient's difficulty walking but does not currently have any other cerebellar signs.  With her persistent cough we will check a chest x-ray to evaluate for pneumonia and with her age will also obtain EKG and troponins to assess for possible cardiac cause.  Plan:  Labs Troponin Urinalysis CT head CT temporal bones Chest x-ray IV fluids Insulin  ED Summary/Re-evaluation:  Patient underwent the above workup which showed possible free air under the right hemidiaphragm.  I believe that this is likely due to a rib shadow and patient has not had any recent surgeries does not have any significant tenderness to palpation on her exam but when disclosed to the daughter she requested that a CT be performed.  CT is pending at this time.  The remainder of her workup was unremarkable and she was not found to be in DKA and no signs of mastoiditis on her CT scan.  Patient signed out to the oncoming doctor awaiting a CT of the abdomen and pelvis and has been  accepted to [facility] for MRI by Dr. Suezanne Jacquet. If negative patient can likely go home and if positive for signs of stroke will need to touch base with neurology.  This patient presents to the ED for concern of complaints listed in HPI, this involves an extensive number of treatment options, and is a complaint that carries with it a high risk of complications and morbidity. Disposition including potential need for admission considered.   Dispo: DC Home. Return precautions discussed including, but not limited to, those listed in the AVS. Allowed pt time to ask questions which were answered fully prior to dc.  Additional history obtained from daughter Records reviewed Outpatient Clinic Notes The following labs were independently interpreted:  Chemistry and urinalysis  and show  hyperglycemia without DKA I independently reviewed the following imaging with scope of interpretation limited to determining acute life threatening conditions related to emergency care: CT Head and agree with the radiologist interpretation with the following exceptions: none I personally reviewed and interpreted cardiac monitoring: normal sinus rhythm  I personally reviewed and interpreted the pt's EKG: see above for interpretation  I have reviewed the patients home medications and made adjustments as needed Social Determinants of health:  Elderly       Final Clinical Impression(s) / ED Diagnoses Final diagnoses:  Dehydration  Hyperglycemia  Dizziness  Transient disorientation    Rx / DC Orders ED Discharge Orders     None         Rondel Baton, MD 01/22/23 1800

## 2023-01-22 NOTE — ED Notes (Signed)
Patient transported to CT 

## 2023-01-22 NOTE — ED Triage Notes (Signed)
Pt arrives to ED from PCP for c/o AMS and maxillary sinusitis witn concern for right sided mastoiditis. Pt notes being treated for acute maxillary sinusitis with Augmentin and her symptoms have not improved. Family notes pt has been confused over the past week since infection. She notes URI/sinusitis symptoms x3 weeks.

## 2023-01-22 NOTE — Discharge Instructions (Addendum)
Take the medications prescribed for your infection and vertigo.  Continue to take allergy medication such as Zyrtec or loratadine.  MRI of your brain did not reveal any evidence of stroke.

## 2023-01-22 NOTE — ED Provider Notes (Signed)
  Physical Exam  BP 106/71   Pulse 72   Temp 98.1 F (36.7 C) (Oral)   Resp 18   SpO2 96%     Procedures  Procedures  ED Course / MDM   Clinical Course as of 01/22/23 1812  Mon Jan 22, 2023  1415 Creatinine(!): 1.07 Baseline 0.7 [RP]  1658 Signed out to Dr Karene Fry. [RP]    Clinical Course User Index [RP] Rondel Baton, MD   Medical Decision Making Amount and/or Complexity of Data Reviewed Labs: ordered. Decision-making details documented in ED Course. Radiology: ordered.  Risk Prescription drug management.   31F, presenting with concern for mastoiditis, has been pre-syncopal and having sx of vertigo. Has had cough, congestion, ear pain.   CT temporal bones normal. Hyperglycemic without DKA. CT Head normal. CXR for a cough for one month with evidence of intraabdominal free air.   CT Abdomen pelvis ordered for evaluation. Pt not peritonitic, without abdominal pain.   MRI Brain ordered for vertigo sx. Plan for POV to [facility]. Labs overall reassuring.   CT Abdomen Pelvis: IMPRESSION:  1. No acute abnormality in the abdomen or pelvis. Specifically, no  pneumoperitoneum. Queried finding on chest radiograph is likely due  to superimposition of the elevated right hemidiaphragm on normally  aerated lung.  2. Tree-in-bud nodules in the lingula, likely infectious or  inflammatory.  3. Nonobstructing left renal stones.  4. Colonic diverticulosis without acute diverticulitis.  5. Aortic Atherosclerosis (ICD10-I70.0). Coronary artery  calcifications. Assessment for potential risk factor modification,  dietary therapy or pharmacologic therapy may be warranted, if  clinically indicated.   Dr Rhoderick Moody at [facility] accepted the pt in ER to ER POV transfer for MRI brain. Family updated on the plan of care.         Ernie Avena, MD 01/22/23 [phone]

## 2023-01-22 NOTE — ED Provider Notes (Signed)
  Physical Exam  BP 128/88 (BP Location: Right Arm)   Pulse 78   Temp 98.5 F (36.9 C) (Oral)   Resp 16   SpO2 99%   Physical Exam  Procedures  Procedures  ED Course / MDM   Clinical Course as of 01/22/23 2159  Mon Jan 22, 2023  1415 Creatinine(!): 1.07 Baseline 0.7 [RP]  1658 Signed out to Dr Karene Fry. [RP]    Clinical Course User Index [RP] Rondel Baton, MD   Medical Decision Making Amount and/or Complexity of Data Reviewed Labs: ordered. Decision-making details documented in ED Course. Radiology: ordered.  Risk Prescription drug management.   MRI is negative.  On my exam, patient has a right sided erythematous ear canal with dull TM.  There is likely fluid behind the TM.  Left ear looks fine.  We will put her on amoxicillin and also Antivert for her vertiginous symptoms.  The patient appears reasonably screened and/or stabilized for discharge and I doubt any other medical condition or other [facility] requiring further screening, evaluation, or treatment in the ED at this time prior to discharge.   Results from the ER workup discussed with the patient face to face and all questions answered to the best of my ability. The patient is safe for discharge with strict return precautions.        Derwood Kaplan, MD 01/22/23 2200

## 2023-01-23 ENCOUNTER — Other Ambulatory Visit (HOSPITAL_BASED_OUTPATIENT_CLINIC_OR_DEPARTMENT_OTHER): Payer: Self-pay

## 2023-01-24 ENCOUNTER — Telehealth: Payer: Self-pay | Admitting: Family Medicine

## 2023-01-24 NOTE — Telephone Encounter (Signed)
Pt would like to switch from Dr Mardelle Matte to Dr Jon Billings. Please advise.

## 2023-02-07 ENCOUNTER — Ambulatory Visit: Payer: Medicare PPO | Admitting: Internal Medicine

## 2023-02-08 ENCOUNTER — Encounter: Payer: Self-pay | Admitting: Internal Medicine

## 2023-02-08 ENCOUNTER — Ambulatory Visit (INDEPENDENT_AMBULATORY_CARE_PROVIDER_SITE_OTHER): Payer: Medicare PPO | Admitting: Internal Medicine

## 2023-02-08 VITALS — BP 104/70 | HR 81 | Temp 97.3°F | Ht 64.0 in | Wt 152.2 lb

## 2023-02-08 DIAGNOSIS — R42 Dizziness and giddiness: Secondary | ICD-10-CM

## 2023-02-08 DIAGNOSIS — E1159 Type 2 diabetes mellitus with other circulatory complications: Secondary | ICD-10-CM

## 2023-02-08 DIAGNOSIS — I152 Hypertension secondary to endocrine disorders: Secondary | ICD-10-CM

## 2023-02-08 DIAGNOSIS — E119 Type 2 diabetes mellitus without complications: Secondary | ICD-10-CM

## 2023-02-08 DIAGNOSIS — H6690 Otitis media, unspecified, unspecified ear: Secondary | ICD-10-CM

## 2023-02-08 DIAGNOSIS — Z7984 Long term (current) use of oral hypoglycemic drugs: Secondary | ICD-10-CM | POA: Diagnosis not present

## 2023-02-08 DIAGNOSIS — Z9181 History of falling: Secondary | ICD-10-CM | POA: Diagnosis not present

## 2023-02-08 DIAGNOSIS — R270 Ataxia, unspecified: Secondary | ICD-10-CM

## 2023-02-08 DIAGNOSIS — Z0000 Encounter for general adult medical examination without abnormal findings: Secondary | ICD-10-CM

## 2023-02-08 MED ORDER — LANCET DEVICE MISC
1.0000 | Freq: Three times a day (TID) | 0 refills | Status: DC
Start: 2023-02-08 — End: 2023-02-16

## 2023-02-08 MED ORDER — AMOXICILLIN 500 MG PO CAPS: 500.0000 mg | ORAL_CAPSULE | Freq: Three times a day (TID) | ORAL | 0 refills | Status: DC

## 2023-02-08 MED ORDER — BLOOD GLUCOSE MONITORING SUPPL DEVI
1.0000 | Freq: Three times a day (TID) | 0 refills | Status: DC
Start: 2023-02-08 — End: 2023-02-16

## 2023-02-08 MED ORDER — ONETOUCH ULTRASOFT LANCETS MISC
12 refills | Status: DC
Start: 2023-02-08 — End: 2023-02-09

## 2023-02-08 MED ORDER — LANCETS MISC. MISC
1.0000 | Freq: Three times a day (TID) | 0 refills | Status: DC
Start: 2023-02-08 — End: 2023-02-17

## 2023-02-08 MED ORDER — BLOOD GLUCOSE TEST VI STRP
1.0000 | ORAL_STRIP | Freq: Three times a day (TID) | 0 refills | Status: DC
Start: 2023-02-08 — End: 2023-03-15

## 2023-02-08 MED ORDER — AMOXICILLIN 500 MG PO CAPS
500.0000 mg | ORAL_CAPSULE | Freq: Three times a day (TID) | ORAL | 0 refills | Status: DC
Start: 2023-02-08 — End: 2023-02-17

## 2023-02-08 NOTE — Progress Notes (Signed)
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 renal stones. 4. Colonic diverticulosis without acute diverticulitis. 5. Aortic Atherosclerosis (ICD10-I70.0). Coronary artery calcifications. Assessment for potential risk factor modification, dietary therapy or pharmacologic therapy may be warranted, if clinically indicated. Electronically Signed   By: Agustin Cree M.D.   On: 01/22/2023 18:02   CT Head Wo Contrast  Result Date: 01/22/2023 CLINICAL DATA:  dizziness, ear pain; ear pain, effusion on exam, dizziness. Concern for right-sided mastoiditis. EXAM: CT HEAD AND TEMPORAL BONES WITHOUT CONTRAST TECHNIQUE: Contiguous axial images were obtained from the base of the skull through the vertex without contrast. Multidetector CT imaging of the temporal bones was performed using the standard protocol without intravenous contrast. RADIATION DOSE REDUCTION: This exam was performed according to the departmental dose-optimization  program which includes automated exposure control, adjustment of the mA and/or kV according to patient size and/or use of iterative reconstruction technique. COMPARISON:  Head CT 04/02/2022. FINDINGS: CT HEAD FINDINGS Brain: No acute intracranial hemorrhage. Gray-white differentiation is preserved. No hydrocephalus or extra-axial collection. No mass effect or midline shift. Vascular: No hyperdense vessel or unexpected calcification. Skull: No calvarial fracture or suspicious bone lesion. Skull base is unremarkable. Sinuses/Orbits: Unremarkable. Other: None. CT TEMPORAL BONES FINDINGS External auditory canals are unremarkable. Tympanic membranes are thin. Scutum is sharp bilaterally. Mastoids and middle ear cavities are well aerated. Ossicles are intact. Cochlea, vestibule and semicircular canals are normal bilaterally. Vestibular aqueducts are not enlarged. IAC's are unremarkable. The facial nerves follow a normal course and are well covered with bone. Carotid canals, sigmoid plates and jugular bulbs are unremarkable. IMPRESSION: 1. No acute intracranial abnormality. 2. Unremarkable appearance of the temporal bones. No evidence of mastoiditis. Electronically Signed   By: Orvan Falconer M.D.   On: 01/22/2023 15:46   CT Temporal Bones Wo Contrast  Result Date: 01/22/2023 CLINICAL DATA:  dizziness, ear pain; ear pain, effusion on exam, dizziness. Concern for right-sided mastoiditis. EXAM: CT HEAD AND TEMPORAL BONES WITHOUT CONTRAST TECHNIQUE: Contiguous axial images were obtained from the base of the skull through the vertex without contrast. Multidetector CT imaging of the temporal bones was performed using the standard protocol without intravenous contrast. RADIATION DOSE REDUCTION: This  exam was performed according to the departmental dose-optimization program which includes automated exposure control, adjustment of the mA and/or kV according to patient size and/or use of iterative reconstruction technique.  COMPARISON:  Head CT 04/02/2022. FINDINGS: CT HEAD FINDINGS Brain: No acute intracranial hemorrhage. Gray-white differentiation is preserved. No hydrocephalus or extra-axial collection. No mass effect or midline shift. Vascular: No hyperdense vessel or unexpected calcification. Skull: No calvarial fracture or suspicious bone lesion. Skull base is unremarkable. Sinuses/Orbits: Unremarkable. Other: None. CT TEMPORAL BONES FINDINGS External auditory canals are unremarkable. Tympanic membranes are thin. Scutum is sharp bilaterally. Mastoids and middle ear cavities are well aerated. Ossicles are intact. Cochlea, vestibule and semicircular canals are normal bilaterally. Vestibular aqueducts are not enlarged. IAC's are unremarkable. The facial nerves follow a normal course and are well covered with bone. Carotid canals, sigmoid plates and jugular bulbs are unremarkable. IMPRESSION: 1. No acute intracranial abnormality. 2. Unremarkable appearance of the temporal bones. No evidence of mastoiditis. Electronically Signed   By: Orvan Falconer M.D.   On: 01/22/2023 15:46       This encounter employed real-time, collaborative documentation. The patient actively reviewed and updated their medical record on a shared screen, ensuring transparency and facilitating joint problem-solving for the problem list, overview, and plan. This approach promotes accurate, informed care. The treatment plan was discussed and reviewed in detail, including medication safety, potential side effects, and all patient questions. We confirmed understanding and comfort with the plan. Follow-up instructions were established, including contacting the office for any concerns, returning if symptoms worsen, persist, or new symptoms develop, and precautions for potential emergency department visits. ----------------------------------------------------- Lula Olszewski, MD  02/10/2023 8:43 AM  [facility]:  [phone]

## 2023-02-09 ENCOUNTER — Telehealth: Payer: Self-pay | Admitting: Internal Medicine

## 2023-02-09 ENCOUNTER — Other Ambulatory Visit: Payer: Self-pay | Admitting: *Deleted

## 2023-02-09 DIAGNOSIS — E119 Type 2 diabetes mellitus without complications: Secondary | ICD-10-CM

## 2023-02-09 DIAGNOSIS — Z0000 Encounter for general adult medical examination without abnormal findings: Secondary | ICD-10-CM | POA: Insufficient documentation

## 2023-02-09 DIAGNOSIS — Z9181 History of falling: Secondary | ICD-10-CM | POA: Insufficient documentation

## 2023-02-09 DIAGNOSIS — R42 Dizziness and giddiness: Secondary | ICD-10-CM | POA: Insufficient documentation

## 2023-02-09 MED ORDER — MECLIZINE HCL 25 MG PO TABS
25.0000 mg | ORAL_TABLET | Freq: Three times a day (TID) | ORAL | 0 refills | Status: DC | PRN
Start: 2023-02-09 — End: 2023-02-13

## 2023-02-09 MED ORDER — ONETOUCH ULTRASOFT LANCETS MISC: 12 refills | Status: DC

## 2023-02-09 NOTE — Assessment & Plan Note (Signed)
Hypertension: Her hypertension is managed with Telmisartan-Hydrochlorothiazide 80-25mg  daily and Metoprolol (1.5 pills daily). We will continue the current regimen.

## 2023-02-09 NOTE — Assessment & Plan Note (Addendum)
Vertigo: Despite treatment with Meclizine, her vertigo persists, though there has been some improvement, it has not met her satisfaction. MRI has ruled out possible cerebellar involvement. We will discontinue Meclizine if it proves ineffective and resume it if falls occur. She is referred to vestibular rehab for balance training. Possibly this is benign paroxysmal positional vertigo (BPPV) or menieres.  Suspected Mastoiditis: She has shown improvement with Amoxicillin treatment, currently experiencing no ear pain or visible pus, yet continued balance issues suggest a possible residual infection. We will extend the Amoxicillin treatment for another 7 days.   Admittedly MRI and CT found no evidence for mastoiditis.

## 2023-02-09 NOTE — Telephone Encounter (Signed)
New rx with directions sent to the pharmacy.

## 2023-02-09 NOTE — Assessment & Plan Note (Signed)
Hyperglycemia: Previously managed with Metformin 500mg  twice daily, she has experienced recent elevated blood sugar levels requiring insulin administration during a hospital visit. We will resume Metformin 500mg  twice daily and monitor blood sugar with a OneTouch device, aiming for readings between 75-150.

## 2023-02-09 NOTE — Telephone Encounter (Signed)
Patient has been scheduled for TOC with Jon Billings.

## 2023-02-09 NOTE — Telephone Encounter (Signed)
[facility] on [address] states RX for lancets are needing to be specified how to use for the insurance to cover. Please call [phone].  RX:  Lancets (ONETOUCH ULTRASOFT) lancets

## 2023-02-09 NOTE — Patient Instructions (Signed)
It was a pleasure seeing you today! Your health and satisfaction are our top priorities.   Sabrina Hew, MD  VISIT SUMMARY:  During your visit, we discussed your ongoing dizziness and balance issues, suspected ear infection, high blood pressure, high blood sugar, and risk of falls. We also talked about your general health and the medications you are currently taking.  YOUR PLAN:  -VERTIGO: Vertigo is a sensation of feeling off balance, often described as a spinning sensation. We will continue to monitor your vertigo. If the medication Meclizine is not helping, we may stop it. We are also referring you to a specialist for balance training.  -SUSPECTED MASTOIDITIS: Mastoiditis is an infection of the mastoid bone of the skull, which can cause dizziness. Your symptoms have improved with the antibiotic Amoxicillin, but we will extend the treatment for another week to ensure the infection is completely cleared.  -HYPERTENSION: Hypertension, or high blood pressure, is a condition that can lead to serious health problems if not managed. We will continue your current medication regimen to control your blood pressure.  -HYPERGLYCEMIA: Hyperglycemia is a condition where your blood sugar levels are consistently too high. We will resume your Metformin medication and monitor your blood sugar levels closely.  -FALL RISK: Due to your balance issues, you are at a high risk of falling. We will provide prescriptions for a walker and a shower chair to help prevent falls.  -GENERAL HEALTH MAINTENANCE: We will discontinue your daily vitamin intake and recommend occasional use instead. Regular blood pressure checks are also advised.  INSTRUCTIONS:  Please continue taking your prescribed medications as directed. Use the walker and shower chair to prevent falls. Monitor your blood sugar levels regularly and aim for readings between 75-150. If you have any concerns or if your symptoms worsen, please contact the clinic  immediately.  Next Steps:  [x]  Early Intervention: Schedule sooner appointment, call our on-call services, or go to emergency room if there is Increase in pain or discomfort New or worsening symptoms Sudden or severe changes in your health [x]  Flexible Follow-Up: We recommend a No follow-ups on file. for optimal routine care. This allows for progress monitoring and treatment adjustments. [x]  Preventive Care: Schedule your annual preventive care visit! It's typically covered by insurance and helps identify potential health issues early. [x]  Lab & X-ray Appointments: Incomplete tests scheduled today, or call to schedule. X-rays: [facility] (M-F, 8:30am-noon or 1pm-5pm). [x]  Medical Information Release: Sign a release form at front desk to obtain relevant medical information we don't have.  Making the Most of Our Focused (20 minute) Appointments:  [x]   Clearly state your top concerns at the beginning of the visit to focus our discussion [x]   If you anticipate you will need more time, please inform the front desk during scheduling - we can book multiple appointments in the same week. [x]   If you have transportation problems- use our convenient video appointments or ask about transportation support. [x]   We can get down to business faster if you use MyChart to update information before the visit and submit non-urgent questions before your visit. Thank you for taking the time to provide details through MyChart.  Let our nurse know and she can import this information into your encounter documents.  Arrival and Wait Times: [x]   Arriving on time ensures that everyone receives prompt attention. [x]   Early morning (8a) and afternoon (1p) appointments tend to have shortest wait times. [x]   Unfortunately, we cannot delay appointments for  late arrivals or hold slots during phone calls.  Getting Answers and Following Up  [x]   Simple Questions & Concerns: For quick questions or basic  follow-up after your visit, reach [place] at [phone] or MyChart messaging. [x]   Complex Concerns: If your concern is more complex, scheduling an appointment might be best. Discuss this with the staff to find the most suitable option. [x]   Lab & Imaging Results: We'll contact you directly if results are abnormal or you don't use MyChart. Most normal results will be on MyChart within 2-3 business days, with a review message from Dr. Jon Billings. Haven't heard back in 2 weeks? Need results sooner? Contact us at [phone]. [x]   Referrals: Our referral coordinator will manage specialist referrals. The specialist's office should contact you within 2 weeks to schedule an appointment. Call us if you haven't heard from them after 2 weeks.  Staying Connected  [x]   MyChart: Activate your MyChart for the fastest way to access results and message [place]. See the last page of this paperwork for instructions on how to activate.  Bring to Your Next Appointment  [x]   Medications: Please bring all your medication bottles to your next appointment to ensure we have an accurate record of your prescriptions. [x]   Health Diaries: If you're monitoring any health conditions at home, keeping a diary of your readings can be very helpful for discussions at your next appointment.  Billing  [x]   X-ray & Lab Orders: These are billed by separate companies. Contact the invoicing company directly for questions or concerns. [x]   Visit Charges: Discuss any billing inquiries with our administrative services team.  Your Satisfaction Matters  [x]   Share Your Experience: We strive for your satisfaction! If you have any complaints, or preferably compliments, please let Dr. Jon Billings know directly or contact our Practice Administrators, Edwena Felty or [employer], by asking at the front desk.   Reviewing Your Records  [x]   Review this early draft of your clinical encounter notes below and the final encounter summary tomorrow on  MyChart after its been completed.   Vertigo Assessment & Plan: Vertigo: Despite treatment with Meclizine, her vertigo persists, though there has been some improvement, it has not met her satisfaction. MRI has ruled out possible cerebellar involvement. We will discontinue Meclizine if it proves ineffective and resume it if falls occur. She is referred to vestibular rehab for balance training. Possibly this is benign paroxysmal positional vertigo (BPPV) or menieres.  Suspected Mastoiditis: She has shown improvement with Amoxicillin treatment, currently experiencing no ear pain or visible pus, yet continued balance issues suggest a possible residual infection. We will extend the Amoxicillin treatment for another 7 days.   Admittedly MRI and CT found no evidence for mastoiditis.    Orders: -     Walker standard -     Shower chair -     PT Vestibular Evaluation; Future -     Meclizine HCl; Take 1 tablet (25 mg total) by mouth 3 (three) times daily as needed for dizziness.  Dispense: 30 tablet; Refill: 0  Type 2 diabetes mellitus without complication, without long-term current use of insulin (HCC) Assessment & Plan: Hyperglycemia: Previously managed with Metformin 500mg  twice daily, she has experienced recent elevated blood sugar levels requiring insulin administration during a hospital visit. We will resume Metformin 500mg  twice daily and monitor blood sugar with a OneTouch device, aiming for readings between 75-150.  Orders: -     Blood Glucose Monitoring Suppl; 1 each by Does  not apply route in the morning, at noon, and at bedtime. May substitute to any manufacturer covered by patient's insurance.  Dispense: 1 each; Refill: 0 -     Blood Glucose Test; 1 each by In Vitro route in the morning, at noon, and at bedtime. May substitute to any manufacturer covered by patient's insurance.  Dispense: 100 strip; Refill: 0 -     Lancet Device; 1 each by Does not apply route in the morning, at noon, and at  bedtime. May substitute to any manufacturer covered by patient's insurance.  Dispense: 1 each; Refill: 0 -     Lancets Misc.; 1 each by Does not apply route in the morning, at noon, and at bedtime. May substitute to any manufacturer covered by patient's insurance.  Dispense: 100 each; Refill: 0  Ear infection -     Amoxicillin; Take 1 capsule (500 mg total) by mouth 3 (three) times daily.  Dispense: 21 capsule; Refill: 0  Ataxia -     Walker standard -     Shower chair -     PT Vestibular Evaluation; Future  Hypertension associated with diabetes [facility]) Assessment & Plan: Hypertension: Her hypertension is managed with Telmisartan-Hydrochlorothiazide 80-25mg  daily and Metoprolol (1.5 pills daily). We will continue the current regimen.   At high risk for injury related to fall -     Walker standard -     Shower chair -     PT Vestibular Evaluation; Future  Healthcare maintenance  Long term current use of oral hypoglycemic drug -     Blood Glucose Monitoring Suppl; 1 each by Does not apply route in the morning, at noon, and at bedtime. May substitute to any manufacturer covered by patient's insurance.  Dispense: 1 each; Refill: 0 -     Blood Glucose Test; 1 each by In Vitro route in the morning, at noon, and at bedtime. May substitute to any manufacturer covered by patient's insurance.  Dispense: 100 strip; Refill: 0 -     Lancet Device; 1 each by Does not apply route in the morning, at noon, and at bedtime. May substitute to any manufacturer covered by patient's insurance.  Dispense: 1 each; Refill: 0 -     Lancets Misc.; 1 each by Does not apply route in the morning, at noon, and at bedtime. May substitute to any manufacturer covered by patient's insurance.  Dispense: 100 each; Refill: 0

## 2023-02-10 DIAGNOSIS — R270 Ataxia, unspecified: Secondary | ICD-10-CM | POA: Insufficient documentation

## 2023-02-13 ENCOUNTER — Emergency Department (HOSPITAL_COMMUNITY): Payer: Medicare PPO

## 2023-02-13 ENCOUNTER — Inpatient Hospital Stay (HOSPITAL_COMMUNITY)
Admission: EM | Admit: 2023-02-13 | Discharge: 2023-02-17 | DRG: 377 | Disposition: A | Discharge status: Home Health | Payer: Medicare PPO | Attending: Internal Medicine | Admitting: Internal Medicine

## 2023-02-13 DIAGNOSIS — E860 Dehydration: Secondary | ICD-10-CM | POA: Diagnosis present

## 2023-02-13 DIAGNOSIS — Z7982 Long term (current) use of aspirin: Secondary | ICD-10-CM

## 2023-02-13 DIAGNOSIS — L89892 Pressure ulcer of other site, stage 2: Secondary | ICD-10-CM | POA: Diagnosis present

## 2023-02-13 DIAGNOSIS — E1110 Type 2 diabetes mellitus with ketoacidosis without coma: Secondary | ICD-10-CM | POA: Diagnosis present

## 2023-02-13 DIAGNOSIS — K625 Hemorrhage of anus and rectum: Principal | ICD-10-CM

## 2023-02-13 DIAGNOSIS — E278 Other specified disorders of adrenal gland: Secondary | ICD-10-CM | POA: Diagnosis not present

## 2023-02-13 DIAGNOSIS — Z8261 Family history of arthritis: Secondary | ICD-10-CM | POA: Diagnosis not present

## 2023-02-13 DIAGNOSIS — F32A Depression, unspecified: Secondary | ICD-10-CM | POA: Diagnosis present

## 2023-02-13 DIAGNOSIS — D62 Acute posthemorrhagic anemia: Secondary | ICD-10-CM | POA: Diagnosis present

## 2023-02-13 DIAGNOSIS — E876 Hypokalemia: Secondary | ICD-10-CM | POA: Diagnosis present

## 2023-02-13 DIAGNOSIS — Z823 Family history of stroke: Secondary | ICD-10-CM | POA: Diagnosis not present

## 2023-02-13 DIAGNOSIS — I10 Essential (primary) hypertension: Secondary | ICD-10-CM | POA: Diagnosis present

## 2023-02-13 DIAGNOSIS — Z79899 Other long term (current) drug therapy: Secondary | ICD-10-CM | POA: Diagnosis not present

## 2023-02-13 DIAGNOSIS — H6690 Otitis media, unspecified, unspecified ear: Secondary | ICD-10-CM | POA: Diagnosis present

## 2023-02-13 DIAGNOSIS — Z87891 Personal history of nicotine dependence: Secondary | ICD-10-CM

## 2023-02-13 DIAGNOSIS — Z90710 Acquired absence of both cervix and uterus: Secondary | ICD-10-CM

## 2023-02-13 DIAGNOSIS — Z7984 Long term (current) use of oral hypoglycemic drugs: Secondary | ICD-10-CM

## 2023-02-13 DIAGNOSIS — I959 Hypotension, unspecified: Secondary | ICD-10-CM | POA: Diagnosis present

## 2023-02-13 DIAGNOSIS — M8580 Other specified disorders of bone density and structure, unspecified site: Secondary | ICD-10-CM | POA: Diagnosis present

## 2023-02-13 DIAGNOSIS — R634 Abnormal weight loss: Secondary | ICD-10-CM | POA: Diagnosis not present

## 2023-02-13 DIAGNOSIS — E119 Type 2 diabetes mellitus without complications: Secondary | ICD-10-CM

## 2023-02-13 DIAGNOSIS — D120 Benign neoplasm of cecum: Secondary | ICD-10-CM | POA: Diagnosis present

## 2023-02-13 DIAGNOSIS — Z833 Family history of diabetes mellitus: Secondary | ICD-10-CM

## 2023-02-13 DIAGNOSIS — K922 Gastrointestinal hemorrhage, unspecified: Secondary | ICD-10-CM | POA: Diagnosis not present

## 2023-02-13 DIAGNOSIS — Z8249 Family history of ischemic heart disease and other diseases of the circulatory system: Secondary | ICD-10-CM

## 2023-02-13 DIAGNOSIS — K635 Polyp of colon: Secondary | ICD-10-CM | POA: Diagnosis not present

## 2023-02-13 DIAGNOSIS — K5731 Diverticulosis of large intestine without perforation or abscess with bleeding: Secondary | ICD-10-CM | POA: Diagnosis present

## 2023-02-13 DIAGNOSIS — N200 Calculus of kidney: Secondary | ICD-10-CM | POA: Diagnosis not present

## 2023-02-13 DIAGNOSIS — E8729 Other acidosis: Secondary | ICD-10-CM

## 2023-02-13 DIAGNOSIS — Z794 Long term (current) use of insulin: Secondary | ICD-10-CM | POA: Diagnosis not present

## 2023-02-13 DIAGNOSIS — E1165 Type 2 diabetes mellitus with hyperglycemia: Secondary | ICD-10-CM | POA: Diagnosis not present

## 2023-02-13 DIAGNOSIS — D509 Iron deficiency anemia, unspecified: Secondary | ICD-10-CM | POA: Diagnosis not present

## 2023-02-13 DIAGNOSIS — Z8262 Family history of osteoporosis: Secondary | ICD-10-CM | POA: Diagnosis not present

## 2023-02-13 DIAGNOSIS — K641 Second degree hemorrhoids: Secondary | ICD-10-CM | POA: Diagnosis not present

## 2023-02-13 DIAGNOSIS — K219 Gastro-esophageal reflux disease without esophagitis: Secondary | ICD-10-CM | POA: Diagnosis present

## 2023-02-13 DIAGNOSIS — K5730 Diverticulosis of large intestine without perforation or abscess without bleeding: Secondary | ICD-10-CM | POA: Diagnosis not present

## 2023-02-13 DIAGNOSIS — R935 Abnormal findings on diagnostic imaging of other abdominal regions, including retroperitoneum: Secondary | ICD-10-CM | POA: Diagnosis not present

## 2023-02-13 DIAGNOSIS — Z9049 Acquired absence of other specified parts of digestive tract: Secondary | ICD-10-CM

## 2023-02-13 DIAGNOSIS — R9431 Abnormal electrocardiogram [ECG] [EKG]: Secondary | ICD-10-CM | POA: Diagnosis not present

## 2023-02-13 DIAGNOSIS — K921 Melena: Secondary | ICD-10-CM | POA: Diagnosis not present

## 2023-02-13 DIAGNOSIS — Z9109 Other allergy status, other than to drugs and biological substances: Secondary | ICD-10-CM

## 2023-02-13 DIAGNOSIS — F05 Delirium due to known physiological condition: Secondary | ICD-10-CM | POA: Diagnosis present

## 2023-02-13 LAB — CBG MONITORING, ED
Glucose-Capillary: 458 mg/dL — ABNORMAL HIGH (ref 70–99)
Glucose-Capillary: 424 mg/dL — ABNORMAL HIGH (ref 70–99)

## 2023-02-13 LAB — COMPREHENSIVE METABOLIC PANEL
ALT: 20 U/L (ref 0–44)
AST: 21 U/L (ref 15–41)
Albumin: 3.9 g/dL (ref 3.5–5.0)
Alkaline Phosphatase: 48 U/L (ref 38–126)
Anion gap: 25 — ABNORMAL HIGH (ref 5–15)
BUN: 15 mg/dL (ref 8–23)
CO2: 17 mmol/L — ABNORMAL LOW (ref 22–32)
Calcium: 9.3 mg/dL (ref 8.9–10.3)
Chloride: 84 mmol/L — ABNORMAL LOW (ref 98–111)
Creatinine, Ser: 1.16 mg/dL — ABNORMAL HIGH (ref 0.44–1.00)
GFR, Estimated: 47 mL/min — ABNORMAL LOW (ref >60)
Glucose, Bld: 510 mg/dL (ref 70–99)
Potassium: 3.1 mmol/L — ABNORMAL LOW (ref 3.5–5.1)
Sodium: 126 mmol/L — ABNORMAL LOW (ref 135–145)
Total Bilirubin: 2.2 mg/dL — ABNORMAL HIGH (ref 0.3–1.2)
Total Protein: 7.3 g/dL (ref 6.5–8.1)

## 2023-02-13 LAB — URINALYSIS, ROUTINE W REFLEX MICROSCOPIC
Bacteria, UA: NONE SEEN
Bilirubin Urine: NEGATIVE
Glucose, UA: >=500 mg/dL — AB
Ketones, ur: 80 mg/dL — AB
Leukocytes,Ua: NEGATIVE
Nitrite: NEGATIVE
Protein, ur: NEGATIVE mg/dL
Specific Gravity, Urine: 1.037 — ABNORMAL HIGH (ref 1.005–1.030)
pH: 5 (ref 5.0–8.0)

## 2023-02-13 LAB — BASIC METABOLIC PANEL
Anion gap: 23 — ABNORMAL HIGH (ref 5–15)
BUN: 16 mg/dL (ref 8–23)
CO2: 17 mmol/L — ABNORMAL LOW (ref 22–32)
Calcium: 8.9 mg/dL (ref 8.9–10.3)
Chloride: 86 mmol/L — ABNORMAL LOW (ref 98–111)
Creatinine, Ser: 1.01 mg/dL — ABNORMAL HIGH (ref 0.44–1.00)
GFR, Estimated: 55 mL/min — ABNORMAL LOW (ref >60)
Glucose, Bld: 433 mg/dL — ABNORMAL HIGH (ref 70–99)
Potassium: 2.1 mmol/L — CL (ref 3.5–5.1)
Sodium: 126 mmol/L — ABNORMAL LOW (ref 135–145)

## 2023-02-13 LAB — CBC WITH DIFFERENTIAL/PLATELET
Abs Immature Granulocytes: 0.05 10*3/uL (ref 0.00–0.07)
Basophils Absolute: 0 10*3/uL (ref 0.0–0.1)
Basophils Relative: 0 %
Eosinophils Absolute: 0 10*3/uL (ref 0.0–0.5)
Eosinophils Relative: 0 %
HCT: 36.8 % (ref 36.0–46.0)
Hemoglobin: 12.6 g/dL (ref 12.0–15.0)
Immature Granulocytes: 1 %
Lymphocytes Relative: 8 %
Lymphs Abs: 0.7 10*3/uL (ref 0.7–4.0)
MCH: 29.6 pg (ref 26.0–34.0)
MCHC: 34.2 g/dL (ref 30.0–36.0)
MCV: 86.4 fL (ref 80.0–100.0)
Monocytes Absolute: 0.3 10*3/uL (ref 0.1–1.0)
Monocytes Relative: 3 %
Neutro Abs: 8.2 10*3/uL — ABNORMAL HIGH (ref 1.7–7.7)
Neutrophils Relative %: 88 %
Platelets: 180 10*3/uL (ref 150–400)
RBC: 4.26 MIL/uL (ref 3.87–5.11)
RDW: 13.4 % (ref 11.5–15.5)
WBC: 9.3 10*3/uL (ref 4.0–10.5)
nRBC: 0 % (ref 0.0–0.2)

## 2023-02-13 LAB — CBC
HCT: 40.2 % (ref 36.0–46.0)
Hemoglobin: 13.5 g/dL (ref 12.0–15.0)
MCH: 29.2 pg (ref 26.0–34.0)
MCHC: 33.6 g/dL (ref 30.0–36.0)
MCV: 87 fL (ref 80.0–100.0)
Platelets: 213 10*3/uL (ref 150–400)
RBC: 4.62 MIL/uL (ref 3.87–5.11)
RDW: 13.2 % (ref 11.5–15.5)
WBC: 11.5 10*3/uL — ABNORMAL HIGH (ref 4.0–10.5)
nRBC: 0 % (ref 0.0–0.2)

## 2023-02-13 LAB — LACTIC ACID, PLASMA: Lactic Acid, Venous: 1.6 mmol/L (ref 0.5–1.9)

## 2023-02-13 LAB — TYPE AND SCREEN
ABO/RH(D): O POS
Antibody Screen: NEGATIVE

## 2023-02-13 LAB — GLUCOSE, CAPILLARY
Glucose-Capillary: 310 mg/dL — ABNORMAL HIGH (ref 70–99)
Glucose-Capillary: 205 mg/dL — ABNORMAL HIGH (ref 70–99)

## 2023-02-13 LAB — ABO/RH: ABO/RH(D): O POS

## 2023-02-13 LAB — BETA-HYDROXYBUTYRIC ACID: Beta-Hydroxybutyric Acid: >8 mmol/L — ABNORMAL HIGH (ref 0.05–0.27)

## 2023-02-13 MED ORDER — LACTATED RINGERS IV BOLUS
20.0000 mL/kg | Freq: Once | INTRAVENOUS | Status: AC
  Administered 2023-02-13: 1380 mL via INTRAVENOUS

## 2023-02-13 MED ORDER — DEXTROSE IN LACTATED RINGERS 5 % IV SOLN: INTRAVENOUS | Status: DC

## 2023-02-13 MED ORDER — POTASSIUM CHLORIDE CRYS ER 20 MEQ PO TBCR
40.0000 meq | EXTENDED_RELEASE_TABLET | Freq: Once | ORAL | Status: AC
  Administered 2023-02-13: 40 meq via ORAL
  Filled 2023-02-13: qty 2

## 2023-02-13 MED ORDER — LATANOPROST 0.005 % OP SOLN
1.0000 [drp] | Freq: Every day | OPHTHALMIC | Status: DC
  Administered 2023-02-13 – 2023-02-16 (×4): 1 [drp] via OPHTHALMIC
  Filled 2023-02-13 (×2): qty 2.5

## 2023-02-13 MED ORDER — MONTELUKAST SODIUM 10 MG PO TABS
10.0000 mg | ORAL_TABLET | Freq: Every day | ORAL | Status: DC
  Administered 2023-02-13 – 2023-02-16 (×4): 10 mg via ORAL
  Filled 2023-02-13 (×4): qty 1

## 2023-02-13 MED ORDER — PANTOPRAZOLE SODIUM 40 MG PO TBEC
40.0000 mg | DELAYED_RELEASE_TABLET | Freq: Every day | ORAL | Status: DC
  Administered 2023-02-14 – 2023-02-17 (×4): 40 mg via ORAL
  Filled 2023-02-13 (×4): qty 1

## 2023-02-13 MED ORDER — POTASSIUM CHLORIDE 10 MEQ/100ML IV SOLN
10.0000 meq | INTRAVENOUS | Status: AC
  Administered 2023-02-14 (×6): 10 meq via INTRAVENOUS
  Filled 2023-02-13 (×6): qty 100

## 2023-02-13 MED ORDER — ORAL CARE MOUTH RINSE: 15.0000 mL | OROMUCOSAL | Status: DC | PRN

## 2023-02-13 MED ORDER — VITAMIN D3 25 MCG (1000 UNIT) PO TABS
1000.0000 [IU] | ORAL_TABLET | Freq: Every day | ORAL | Status: DC
  Administered 2023-02-14 – 2023-02-17 (×4): 1000 [IU] via ORAL
  Filled 2023-02-13 (×4): qty 1

## 2023-02-13 MED ORDER — CHLORHEXIDINE GLUCONATE CLOTH 2 % EX PADS
6.0000 | MEDICATED_PAD | Freq: Every day | CUTANEOUS | Status: DC
  Administered 2023-02-14: 6 via TOPICAL

## 2023-02-13 MED ORDER — INSULIN REGULAR(HUMAN) IN NACL 100-0.9 UT/100ML-% IV SOLN
INTRAVENOUS | Status: DC
  Administered 2023-02-13: 10 [IU]/h via INTRAVENOUS
  Administered 2023-02-13: 11.5 [IU]/h via INTRAVENOUS
  Filled 2023-02-13: qty 100

## 2023-02-13 MED ORDER — OMEGA-3-ACID ETHYL ESTERS 1 G PO CAPS
1.0000 g | ORAL_CAPSULE | Freq: Every day | ORAL | Status: DC
  Administered 2023-02-14 – 2023-02-17 (×4): 1 g via ORAL
  Filled 2023-02-13 (×4): qty 1

## 2023-02-13 MED ORDER — DEXTROSE 50 % IV SOLN: 0.0000 mL | INTRAVENOUS | Status: DC | PRN

## 2023-02-13 MED ORDER — LACTATED RINGERS IV BOLUS
1000.0000 mL | Freq: Once | INTRAVENOUS | Status: AC
  Administered 2023-02-13: 1000 mL via INTRAVENOUS

## 2023-02-13 MED ORDER — IOHEXOL 350 MG/ML SOLN
100.0000 mL | Freq: Once | INTRAVENOUS | Status: AC | PRN
  Administered 2023-02-13: 100 mL via INTRAVENOUS

## 2023-02-13 MED ORDER — POTASSIUM CHLORIDE 10 MEQ/100ML IV SOLN
10.0000 meq | INTRAVENOUS | Status: AC
  Administered 2023-02-13 – 2023-02-14 (×4): 10 meq via INTRAVENOUS
  Filled 2023-02-13 (×4): qty 100

## 2023-02-13 MED ORDER — OMEGA-3 FATTY ACIDS 1000 MG PO CAPS: 1.0000 g | ORAL_CAPSULE | Freq: Every day | ORAL | Status: DC

## 2023-02-13 MED ORDER — LACTATED RINGERS IV SOLN: INTRAVENOUS | Status: DC

## 2023-02-13 NOTE — ED Triage Notes (Signed)
Patient here from home reporting rectal bleeding for "months". Reports bright red blood in toilet this morning.

## 2023-02-13 NOTE — Assessment & Plan Note (Addendum)
Amount of bleeding today is suspicious for diverticular bleed. HGB 13 currently Will check H/H Q4H with the DKA labs for now Type and screen CTA abd/pelvis neg for acute bleeding source, or other major acute intra-abdominal abnormality / surgical target. Tele monitor GI consult in AM (Pt sent in to ED from Dr. Kenna Gilbert office today).

## 2023-02-13 NOTE — Assessment & Plan Note (Signed)
DKA vs hyperglycemia: Diabetes crisis pathway Insulin gtt IVF bolus and maint per pathway See anion gap acidosis below.

## 2023-02-13 NOTE — Assessment & Plan Note (Addendum)
DKA vs lactic acidosis, unfortunately labs still pending. Insulin gtt per DKA pathway IVF bolus and maint per DKA pathway. No severe abd pain, no findings on CTA AP to suggest ischemic bowel or anything.
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
## 2023-02-13 NOTE — ED Notes (Signed)
Patient went to the bedside commode and had a lot of bloody stool in the hat.
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
## 2023-02-13 NOTE — Assessment & Plan Note (Signed)
Replace K Check Mg
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
## 2023-02-14 ENCOUNTER — Other Ambulatory Visit: Payer: Self-pay

## 2023-02-14 ENCOUNTER — Telehealth (HOSPITAL_COMMUNITY): Payer: Self-pay | Admitting: Pharmacy Technician

## 2023-02-14 ENCOUNTER — Ambulatory Visit: Payer: Medicare PPO | Admitting: Internal Medicine

## 2023-02-14 ENCOUNTER — Other Ambulatory Visit (HOSPITAL_COMMUNITY): Payer: Self-pay

## 2023-02-14 ENCOUNTER — Encounter (HOSPITAL_COMMUNITY): Payer: Self-pay | Admitting: Internal Medicine

## 2023-02-14 DIAGNOSIS — K625 Hemorrhage of anus and rectum: Secondary | ICD-10-CM | POA: Diagnosis not present

## 2023-02-14 LAB — BASIC METABOLIC PANEL
Anion gap: 17 — ABNORMAL HIGH (ref 5–15)
Anion gap: 15 (ref 5–15)
Anion gap: 10 (ref 5–15)
Anion gap: 9 (ref 5–15)
BUN: 12 mg/dL (ref 8–23)
BUN: 12 mg/dL (ref 8–23)
BUN: 10 mg/dL (ref 8–23)
BUN: 9 mg/dL (ref 8–23)
CO2: 21 mmol/L — ABNORMAL LOW (ref 22–32)
CO2: 21 mmol/L — ABNORMAL LOW (ref 22–32)
CO2: 25 mmol/L (ref 22–32)
CO2: 22 mmol/L (ref 22–32)
Calcium: 9.1 mg/dL (ref 8.9–10.3)
Calcium: 8.9 mg/dL (ref 8.9–10.3)
Calcium: 9.1 mg/dL (ref 8.9–10.3)
Calcium: 8.9 mg/dL (ref 8.9–10.3)
Chloride: 92 mmol/L — ABNORMAL LOW (ref 98–111)
Chloride: 96 mmol/L — ABNORMAL LOW (ref 98–111)
Chloride: 98 mmol/L (ref 98–111)
Chloride: 102 mmol/L (ref 98–111)
Creatinine, Ser: 0.82 mg/dL (ref 0.44–1.00)
Creatinine, Ser: 0.86 mg/dL (ref 0.44–1.00)
Creatinine, Ser: 0.75 mg/dL (ref 0.44–1.00)
Creatinine, Ser: 0.67 mg/dL (ref 0.44–1.00)
GFR, Estimated: >60 mL/min (ref >60)
GFR, Estimated: >60 mL/min (ref >60)
GFR, Estimated: >60 mL/min (ref >60)
GFR, Estimated: >60 mL/min (ref >60)
Glucose, Bld: 135 mg/dL — ABNORMAL HIGH (ref 70–99)
Glucose, Bld: 177 mg/dL — ABNORMAL HIGH (ref 70–99)
Glucose, Bld: 150 mg/dL — ABNORMAL HIGH (ref 70–99)
Glucose, Bld: 136 mg/dL — ABNORMAL HIGH (ref 70–99)
Potassium: 2.8 mmol/L — ABNORMAL LOW (ref 3.5–5.1)
Potassium: 2.7 mmol/L — CL (ref 3.5–5.1)
Potassium: 2.9 mmol/L — ABNORMAL LOW (ref 3.5–5.1)
Potassium: 4.1 mmol/L (ref 3.5–5.1)
Sodium: 130 mmol/L — ABNORMAL LOW (ref 135–145)
Sodium: 132 mmol/L — ABNORMAL LOW (ref 135–145)
Sodium: 133 mmol/L — ABNORMAL LOW (ref 135–145)
Sodium: 133 mmol/L — ABNORMAL LOW (ref 135–145)

## 2023-02-14 LAB — HEMOGLOBIN AND HEMATOCRIT, BLOOD
HCT: 36.7 % (ref 36.0–46.0)
HCT: 35.1 % — ABNORMAL LOW (ref 36.0–46.0)
HCT: 34.4 % — ABNORMAL LOW (ref 36.0–46.0)
HCT: 34 % — ABNORMAL LOW (ref 36.0–46.0)
HCT: 35 % — ABNORMAL LOW (ref 36.0–46.0)
Hemoglobin: 12.6 g/dL (ref 12.0–15.0)
Hemoglobin: 11.8 g/dL — ABNORMAL LOW (ref 12.0–15.0)
Hemoglobin: 11.8 g/dL — ABNORMAL LOW (ref 12.0–15.0)
Hemoglobin: 11.5 g/dL — ABNORMAL LOW (ref 12.0–15.0)
Hemoglobin: 11.9 g/dL — ABNORMAL LOW (ref 12.0–15.0)

## 2023-02-14 LAB — BLOOD GAS, VENOUS
Acid-Base Excess: 2.4 mmol/L — ABNORMAL HIGH (ref 0.0–2.0)
Bicarbonate: 27.2 mmol/L (ref 20.0–28.0)
Drawn by: 35529
O2 Saturation: 36.1 %
Patient temperature: 36.4
pCO2, Ven: 41 mmHg — ABNORMAL LOW (ref 44–60)
pH, Ven: 7.43 (ref 7.25–7.43)
pO2, Ven: <31 mmHg — CL (ref 32–45)

## 2023-02-14 LAB — GLUCOSE, CAPILLARY
Glucose-Capillary: 125 mg/dL — ABNORMAL HIGH (ref 70–99)
Glucose-Capillary: 167 mg/dL — ABNORMAL HIGH (ref 70–99)
Glucose-Capillary: 112 mg/dL — ABNORMAL HIGH (ref 70–99)
Glucose-Capillary: 154 mg/dL — ABNORMAL HIGH (ref 70–99)
Glucose-Capillary: 164 mg/dL — ABNORMAL HIGH (ref 70–99)
Glucose-Capillary: 174 mg/dL — ABNORMAL HIGH (ref 70–99)
Glucose-Capillary: 177 mg/dL — ABNORMAL HIGH (ref 70–99)
Glucose-Capillary: 182 mg/dL — ABNORMAL HIGH (ref 70–99)
Glucose-Capillary: 120 mg/dL — ABNORMAL HIGH (ref 70–99)
Glucose-Capillary: 126 mg/dL — ABNORMAL HIGH (ref 70–99)
Glucose-Capillary: 311 mg/dL — ABNORMAL HIGH (ref 70–99)

## 2023-02-14 LAB — LACTIC ACID, PLASMA: Lactic Acid, Venous: 2 mmol/L (ref 0.5–1.9)

## 2023-02-14 LAB — BETA-HYDROXYBUTYRIC ACID: Beta-Hydroxybutyric Acid: 5.59 mmol/L — ABNORMAL HIGH (ref 0.05–0.27)

## 2023-02-14 LAB — MAGNESIUM: Magnesium: 1.5 mg/dL — ABNORMAL LOW (ref 1.7–2.4)

## 2023-02-14 MED ORDER — INSULIN ASPART 100 UNIT/ML IJ SOLN
0.0000 [IU] | Freq: Three times a day (TID) | INTRAMUSCULAR | Status: DC
  Administered 2023-02-14: 7 [IU] via SUBCUTANEOUS
  Administered 2023-02-15 (×3): 2 [IU] via SUBCUTANEOUS
  Administered 2023-02-16: 3 [IU] via SUBCUTANEOUS
  Administered 2023-02-16: 1 [IU] via SUBCUTANEOUS
  Administered 2023-02-17: 2 [IU] via SUBCUTANEOUS

## 2023-02-14 MED ORDER — INSULIN ASPART 100 UNIT/ML IJ SOLN: 0.0000 [IU] | Freq: Three times a day (TID) | INTRAMUSCULAR | Status: DC

## 2023-02-14 MED ORDER — INSULIN ASPART 100 UNIT/ML IJ SOLN
0.0000 [IU] | Freq: Every day | INTRAMUSCULAR | Status: DC
Start: 2023-02-14 — End: 2023-02-14

## 2023-02-14 MED ORDER — SODIUM CHLORIDE 0.9 % IV BOLUS
250.0000 mL | Freq: Once | INTRAVENOUS | Status: AC
  Administered 2023-02-14: 250 mL via INTRAVENOUS

## 2023-02-14 MED ORDER — SODIUM CHLORIDE 0.9 % IV SOLN: INTRAVENOUS | Status: DC

## 2023-02-14 MED ORDER — INSULIN ASPART 100 UNIT/ML IJ SOLN
3.0000 [IU] | Freq: Three times a day (TID) | INTRAMUSCULAR | Status: DC
  Administered 2023-02-14 – 2023-02-17 (×6): 3 [IU] via SUBCUTANEOUS

## 2023-02-14 MED ORDER — INSULIN STARTER KIT- PEN NEEDLES (ENGLISH)
1.0000 | Freq: Once | Status: DC
  Filled 2023-02-14: qty 1

## 2023-02-14 MED ORDER — POTASSIUM CHLORIDE CRYS ER 20 MEQ PO TBCR
40.0000 meq | EXTENDED_RELEASE_TABLET | Freq: Once | ORAL | Status: AC
  Administered 2023-02-14: 40 meq via ORAL
  Filled 2023-02-14: qty 2

## 2023-02-14 MED ORDER — MAGNESIUM SULFATE 2 GM/50ML IV SOLN
2.0000 g | Freq: Once | INTRAVENOUS | Status: AC
  Administered 2023-02-14: 2 g via INTRAVENOUS
  Filled 2023-02-14: qty 50

## 2023-02-14 MED ORDER — INSULIN ASPART 100 UNIT/ML IJ SOLN
0.0000 [IU] | Freq: Every day | INTRAMUSCULAR | Status: DC
  Administered 2023-02-14: 2 [IU] via SUBCUTANEOUS

## 2023-02-14 MED ORDER — MELATONIN 5 MG PO TABS
5.0000 mg | ORAL_TABLET | Freq: Every evening | ORAL | Status: DC | PRN
  Administered 2023-02-14 – 2023-02-16 (×3): 5 mg via ORAL
  Filled 2023-02-14 (×3): qty 1

## 2023-02-14 MED ORDER — LIVING WELL WITH DIABETES BOOK
Freq: Once | Status: DC
  Filled 2023-02-14: qty 1

## 2023-02-14 MED ORDER — SODIUM CHLORIDE 0.9 % IV BOLUS
500.0000 mL | Freq: Once | INTRAVENOUS | Status: AC
  Administered 2023-02-14: 500 mL via INTRAVENOUS

## 2023-02-14 MED ORDER — INSULIN DETEMIR 100 UNIT/ML ~~LOC~~ SOLN
15.0000 [IU] | Freq: Every day | SUBCUTANEOUS | Status: DC
  Administered 2023-02-14 – 2023-02-16 (×3): 15 [IU] via SUBCUTANEOUS
  Filled 2023-02-14 (×5): qty 0.15

## 2023-02-14 MED ORDER — POTASSIUM CHLORIDE 10 MEQ/100ML IV SOLN
10.0000 meq | INTRAVENOUS | Status: AC
  Administered 2023-02-14 (×4): 10 meq via INTRAVENOUS
  Filled 2023-02-14 (×4): qty 100

## 2023-02-14 NOTE — Telephone Encounter (Signed)
Pharmacy Patient Advocate Encounter   Received notification that prior authorization for FreeStyle Libre 3 Sensor is required/requested.    PA submitted to [facility] via CoverMyMeds Key/confirmation #/EOC [id number] Status is pending

## 2023-02-14 NOTE — Progress Notes (Signed)
NP notified

## 2023-02-14 NOTE — Progress Notes (Signed)
PROGRESS NOTE    Sabrina Aguirre  BTD:[id number] DOB: Jun 16, 1941 DOA: 02/13/2023 PCP: Lula Olszewski, MD   Brief Narrative: 82 year old with past medical history significant for diabetes type 2, hypertension presents to the ED with complaint of rectal bleeding ongoing for some time, patient reported bright red blood per rectum.  She also reports loose stool. She has had some cough. She has recently diagnose with ear infection, her PC was having concern with mastoiditis, and prescribe another 7 days of antibiotics on June 20th. She has been evaluated by her PCP for dizziness, she even had MRI brain 6/03: negative for stroke.   CT angio abdomen and pelvis done 6/25: No acute intra-abdominal or pelvic pathology. No evidence of active GI bleed.   Assessment & Plan:   Principal Problem:   BRBPR (bright red blood per rectum) Active Problems:   Uncontrolled type 2 diabetes mellitus with hyperglycemia, without long-term current use of insulin (HCC)   High anion gap metabolic acidosis   Hypokalemia   1-DKA;  Patient presents with CBG 500, gap of 25.  Bicarb of 16, beta butyrate acid 8 She was started on insulin drip and IV fluids Anion gap is closed, down to 9, bicarb 22, blood glucose 136 She will be transition to Levemir 15 units daily. SSI.  Check A1c.  Previously taking metformin.  She will need to be discharge on Insulin.   2-Hypokalemia; replete orally.   3-Hyponatremia, and pseudohyponatremia in setting hyperglycemia dn dehydration.  Improved with IV fluids and correction.  4-Dizziness; She has had MRI brain done on 6/3 for same negative for stroke.  Suspect related to hyperglycemia, dehydration.  -will consult PT for vestibular evaluation.  -She had recent tx for ear infection.   5-GI bleed; BRB per rectum;  Continue to monitor hb. Down to 11 form 13 GI has been consulted.  CTA negative for bleeding.  Continue to monitor hb.   6-Hypotension;  Suspect related to  dehydration in setting of hyperglycemia.  Improved with IV fluids.  Hold Home BP medications.   Recent ear infection.  She has left 2 more days of antibiotics. Due to diarrhea will hold antibiotics.   Diarrhea; Will check for C diff due to recent antibiotics use.   See wound documentation below.  Pressure Injury 02/13/23 Rectum Medial;Upper Stage 2 -  Partial thickness loss of dermis presenting as a shallow open injury with a red, pink wound bed without slough. (Active)  02/13/23 2200  Location: Rectum  Location Orientation: Medial;Upper  Staging: Stage 2 -  Partial thickness loss of dermis presenting as a shallow open injury with a red, pink wound bed without slough.  Wound Description (Comments):   Present on Admission: Yes  Dressing Type Foam - Lift dressing to assess site every shift 02/13/23 2200                  Estimated body mass index is 26.04 kg/m as calculated from the following:   Height as of this encounter: 5\' 4"  (1.626 m).   Weight as of this encounter: 68.8 kg.   DVT prophylaxis: SCD Code Status: Full code Family Communication: Daughter at bedside.  Disposition Plan:  Status is: Observation The patient remains OBS appropriate and will d/c before 2 midnights.    Consultants:  GI, Dr Man.   Procedures:    Antimicrobials:    Subjective: She is still feeling dizziness. Per daughter at home patient wasn't  be able to walk because she was dizzy.  She has been having bloody BM, worse recently.  She was prescribe 7 more days of antibiotics for ear infection on 6/20. She probably has 2 more days left.  Report loose stool.    Objective: Vitals:   02/14/23 0500 02/14/23 0600 02/14/23 0700 02/14/23 0722  BP: (!) 90/48 (!) 93/52 (!) 84/45 100/60  Pulse: 80 78 79 82  Resp: 15 19 18 19   Temp:      TempSrc:      SpO2: 96% 97% 98% 100%  Weight:      Height:        Intake/Output Summary (Last 24 hours) at 02/14/2023 0807 Last data filed at  02/14/2023 0719 Gross per 24 hour  Intake 2418.44 ml  Output --  Net 2418.44 ml   Filed Weights   02/13/23 2200  Weight: 68.8 kg    Examination:  General exam: Appears calm and comfortable  Respiratory system: Clear to auscultation. Respiratory effort normal. Cardiovascular system: S1 & S2 heard, RRR. No JVD, murmurs, rubs, gallops or clicks. No pedal edema. Gastrointestinal system: Abdomen is nondistended, soft and nontender. No organomegaly or masses felt. Normal bowel sounds heard. Central nervous system: Alert and oriented. No focal neurological deficits. Extremities: Symmetric 5 x 5 power.    Data Reviewed: I have personally reviewed following labs and imaging studies  CBC: Recent Labs  Lab 02/13/23 1710 02/13/23 2016 02/14/23 0054 02/14/23 0311 02/14/23 0557  WBC 11.5* 9.3  --   --   --   NEUTROABS  --  8.2*  --   --   --   HGB 13.5 12.6 12.6 11.8* 11.8*  HCT 40.2 36.8 36.7 35.1* 34.4*  MCV 87.0 86.4  --   --   --   PLT 213 180  --   --   --    Basic Metabolic Panel: Recent Labs  Lab 02/13/23 1710 02/13/23 2016 02/14/23 0054 02/14/23 0311 02/14/23 0557  NA 126* 126* 130* 132* 133*  K 3.1* 2.1* 2.8* 2.7* 2.9*  CL 84* 86* 92* 96* 98  CO2 17* 17* 21* 21* 25  GLUCOSE 510* 433* 135* 177* 150*  BUN 15 16 12 12 10   CREATININE 1.16* 1.01* 0.82 0.86 0.75  CALCIUM 9.3 8.9 9.1 8.9 9.1  MG  --   --  1.5*  --   --    GFR: Estimated Creatinine Clearance: 50.7 mL/min (by C-G formula based on SCr of 0.75 mg/dL). Liver Function Tests: Recent Labs  Lab 02/13/23 1710  AST 21  ALT 20  ALKPHOS 48  BILITOT 2.2*  PROT 7.3  ALBUMIN 3.9   No results for input(s): "LIPASE", "AMYLASE" in the last 168 hours. No results for input(s): "AMMONIA" in the last 168 hours. Coagulation Profile: No results for input(s): "INR", "PROTIME" in the last 168 hours. Cardiac Enzymes: No results for input(s): "CKTOTAL", "CKMB", "CKMBINDEX", "TROPONINI" in the last 168 hours. BNP  (last 3 results) No results for input(s): "PROBNP" in the last 8760 hours. HbA1C: No results for input(s): "HGBA1C" in the last 72 hours. CBG: Recent Labs  Lab 02/14/23 0310 02/14/23 0428 02/14/23 0539 02/14/23 0642 02/14/23 0749  GLUCAP 167* 182* 164* 154* 174*   Lipid Profile: No results for input(s): "CHOL", "HDL", "LDLCALC", "TRIG", "CHOLHDL", "LDLDIRECT" in the last 72 hours. Thyroid Function Tests: No results for input(s): "TSH", "T4TOTAL", "FREET4", "T3FREE", "THYROIDAB" in the last 72 hours. Anemia Panel: No results for input(s): "VITAMINB12", "FOLATE", "FERRITIN", "TIBC", "IRON", "RETICCTPCT" in the last 72 hours. Sepsis Labs:  Recent Labs  Lab 02/13/23 2016 02/14/23 0054  LATICACIDVEN 1.6 2.0*    No results found for this or any previous visit (from the past 240 hour(s)).       Radiology Studies: CT Angio Abd/Pel W and/or Wo Contrast  Result Date: 02/13/2023 CLINICAL DATA:  GI bleed. EXAM: CTA ABDOMEN AND PELVIS WITHOUT AND WITH CONTRAST TECHNIQUE: Multidetector CT imaging of the abdomen and pelvis was performed using the standard protocol during bolus administration of intravenous contrast. Multiplanar reconstructed images and MIPs were obtained and reviewed to evaluate the vascular anatomy. RADIATION DOSE REDUCTION: This exam was performed according to the departmental dose-optimization program which includes automated exposure control, adjustment of the mA and/or kV according to patient size and/or use of iterative reconstruction technique. CONTRAST:  OMNIPAQUE IOHEXOL 350 MG/ML SOLN COMPARISON:  CT abdomen pelvis dated 01/22/2023. FINDINGS: VASCULAR Aorta: Mild atherosclerotic calcification. No aneurysmal dilatation or dissection. No periaortic fluid collection. Celiac: The celiac trunk and its major branches are patent. SMA: Patent without evidence of aneurysm, dissection, vasculitis or significant stenosis. Renals: Both renal arteries are patent without  evidence of aneurysm, dissection, vasculitis, fibromuscular dysplasia or significant stenosis. IMA: Patent without evidence of aneurysm, dissection, vasculitis or significant stenosis. Inflow: Mild atherosclerotic calcification. No aneurysmal dilatation or dissection. The iliac arteries are patent. Proximal Outflow: The visualized proximal outflow is patent. Veins: The IVC is unremarkable. The splenic vein, SMV, and main portal vein are patent. No portal venous gas. Review of the MIP images confirms the above findings. NON-VASCULAR Lower chest: The visualized lung bases are clear. No intra-abdominal free air or free fluid. Hepatobiliary: Mild irregularity of the liver contour suspicious for changes of cirrhosis. Clinical correlation is recommended. No biliary dilatation. Cholecystectomy. No retained calcified stone noted in the central CBD. Pancreas: Unremarkable. No pancreatic ductal dilatation or surrounding inflammatory changes. Spleen: Normal in size without focal abnormality. Adrenals/Urinary Tract: The right adrenal gland is unremarkable. There is a 1.5 cm indeterminate left adrenal nodule, present dating back to 2018, likely an adenoma. Nonobstructing left renal calculi measure up to 7 mm in the inferior pole of the left kidney. There is no hydronephrosis on either side. There is symmetric enhancement of the kidneys. The visualized ureters and urinary bladder appear unremarkable. Stomach/Bowel: There is no bowel obstruction or active inflammation. No evidence of active GI bleed. The appendix is normal. Lymphatic: There is no adenopathy. Reproductive: Hysterectomy.  No adnexal masses. Other: None Musculoskeletal: Degenerative changes of the spine. No acute osseous pathology. IMPRESSION: 1. No acute intra-abdominal or pelvic pathology. No evidence of active GI bleed. 2. No bowel obstruction. Normal appendix. 3. Nonobstructing left renal calculi. No hydronephrosis. 4. Mild irregularity of the liver contour  suspicious for changes of cirrhosis. Electronically Signed   By: Elgie Collard M.D.   On: 02/13/2023 19:25        Scheduled Meds:  Chlorhexidine Gluconate Cloth  6 each Topical Daily   cholecalciferol  1,000 Units Oral Daily   latanoprost  1 drop Both Eyes QHS   montelukast  10 mg Oral QHS   omega-3 acid ethyl esters  1 g Oral Daily   pantoprazole  40 mg Oral Daily   Continuous Infusions:  dextrose 5% lactated ringers 125 mL/hr at 02/14/23 0754   insulin Stopped (02/14/23 0711)   lactated ringers Stopped (02/14/23 0036)   potassium chloride 10 mEq (02/14/23 0752)     LOS: 0 days    Time spent: 35 minutes    Buelah Rennie A Chandrea Zellman,  MD [facility]   If 7PM-7AM, please contact night-coverage [email or web address]  02/14/2023, 8:07 AM
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
## 2023-02-14 NOTE — Inpatient Diabetes Management (Signed)
Inpatient Diabetes Program Recommendations  AACE/ADA: New Consensus Statement on Inpatient Glycemic Control (2015)  Target Ranges:  Prepandial:   less than 140 mg/dL      Peak postprandial:   less than 180 mg/dL (1-2 hours)      Critically ill patients:  140 - 180 mg/dL   Lab Results  Component Value Date   GLUCAP 126 (H) 02/14/2023   HGBA1C 6.5 (A) 11/08/2022    Review of Glycemic Control  Diabetes history: DM2 Outpatient Diabetes medications: metformin 500 BID Current orders for Inpatient glycemic control: IV insulin per [employer] for DKA, transitioning to Levemir 15 units (given at 1031)  Updated HgbA1C pending. Previously 6.5%  Will need to be discharged on insulin. Demonstrated pen with pt and dtr. Interested in CGM, will do med check with pharmacy to see which is preferred  PA for Josephine Igo has been started. Lantus pens - $35 Novolog pens - $35  Inpatient Diabetes Program Recommendations:    Add Novolog 0-9 units TID with meals and 0-5 HS  Consider Novolog 3 units TID with meals if eating > 50%  Ordered Living Well with Diabetes book and Insulin pen starter kit.  Spoke with pt at bedside regarding recent uncontrolled blood sugars.Pt's daughter states pt eats healthy, no sodas, small amount of low-sugar juice. Has lost significant amount of weight over last 6 months. Explained how hyperglycemia leads to damage within blood vessels which lead to the common complications seen with uncontrolled diabetes. Stressed to the patient the importance of improving glycemic control to prevent further complications from uncontrolled diabetes, and understanding that insulin will likely be needed at discharge. Discussed impact of nutrition, exercise, stress, sickness, and medications on diabetes control. Demonstrated insulin pen use. Pt interested in CGM (have started PA of Libre from pharmacy.) Awaiting HgbA1C results.  Will f/u in am.  Thank you. Ailene Ards, RD, LDN,  CDCES Inpatient Diabetes Coordinator [phone]

## 2023-02-14 NOTE — TOC Benefit Eligibility Note (Signed)
Pharmacy Patient Advocate Encounter  Insurance verification completed.    The patient is insured through [facility] Medicare Part D  Ran test claim for Lantus Pen and the current 30 day co-pay is $35.00.  Ran test claim for Novolog Pen and the current 30 day co-pay is $35.00.  Ran test claim for [employer] and Requires Prior Authorization  Ran test claim for [employer] and Requires Prior Authorization   This test claim was processed through [employer]- copay amounts may vary at other pharmacies due to [employer], or as the patient moves through the different stages of their insurance plan.    Roland Earl, CPHT Pharmacy Patient Advocate Specialist [facility] Patient Advocate Team Direct Number: [phone]  Fax: [phone]

## 2023-02-15 ENCOUNTER — Inpatient Hospital Stay (HOSPITAL_COMMUNITY): Payer: Medicare PPO

## 2023-02-15 ENCOUNTER — Other Ambulatory Visit (HOSPITAL_COMMUNITY): Payer: Self-pay

## 2023-02-15 DIAGNOSIS — E1110 Type 2 diabetes mellitus with ketoacidosis without coma: Secondary | ICD-10-CM | POA: Diagnosis not present

## 2023-02-15 DIAGNOSIS — H6690 Otitis media, unspecified, unspecified ear: Secondary | ICD-10-CM | POA: Diagnosis present

## 2023-02-15 DIAGNOSIS — Z7984 Long term (current) use of oral hypoglycemic drugs: Secondary | ICD-10-CM | POA: Diagnosis not present

## 2023-02-15 DIAGNOSIS — K635 Polyp of colon: Secondary | ICD-10-CM | POA: Diagnosis not present

## 2023-02-15 DIAGNOSIS — K219 Gastro-esophageal reflux disease without esophagitis: Secondary | ICD-10-CM | POA: Diagnosis not present

## 2023-02-15 DIAGNOSIS — K5730 Diverticulosis of large intestine without perforation or abscess without bleeding: Secondary | ICD-10-CM | POA: Diagnosis not present

## 2023-02-15 DIAGNOSIS — D120 Benign neoplasm of cecum: Secondary | ICD-10-CM | POA: Diagnosis present

## 2023-02-15 DIAGNOSIS — K625 Hemorrhage of anus and rectum: Secondary | ICD-10-CM | POA: Diagnosis not present

## 2023-02-15 DIAGNOSIS — E860 Dehydration: Secondary | ICD-10-CM | POA: Diagnosis not present

## 2023-02-15 DIAGNOSIS — D62 Acute posthemorrhagic anemia: Secondary | ICD-10-CM | POA: Diagnosis not present

## 2023-02-15 DIAGNOSIS — K5731 Diverticulosis of large intestine without perforation or abscess with bleeding: Secondary | ICD-10-CM | POA: Diagnosis not present

## 2023-02-15 DIAGNOSIS — E876 Hypokalemia: Secondary | ICD-10-CM | POA: Diagnosis not present

## 2023-02-15 DIAGNOSIS — L89892 Pressure ulcer of other site, stage 2: Secondary | ICD-10-CM | POA: Diagnosis not present

## 2023-02-15 DIAGNOSIS — F05 Delirium due to known physiological condition: Secondary | ICD-10-CM | POA: Diagnosis not present

## 2023-02-15 DIAGNOSIS — F32A Depression, unspecified: Secondary | ICD-10-CM | POA: Diagnosis not present

## 2023-02-15 DIAGNOSIS — Z833 Family history of diabetes mellitus: Secondary | ICD-10-CM | POA: Diagnosis not present

## 2023-02-15 DIAGNOSIS — Z8261 Family history of arthritis: Secondary | ICD-10-CM | POA: Diagnosis not present

## 2023-02-15 DIAGNOSIS — I10 Essential (primary) hypertension: Secondary | ICD-10-CM | POA: Diagnosis not present

## 2023-02-15 DIAGNOSIS — R9431 Abnormal electrocardiogram [ECG] [EKG]: Secondary | ICD-10-CM

## 2023-02-15 DIAGNOSIS — Z794 Long term (current) use of insulin: Secondary | ICD-10-CM | POA: Diagnosis not present

## 2023-02-15 DIAGNOSIS — I959 Hypotension, unspecified: Secondary | ICD-10-CM | POA: Diagnosis not present

## 2023-02-15 DIAGNOSIS — M8580 Other specified disorders of bone density and structure, unspecified site: Secondary | ICD-10-CM | POA: Diagnosis present

## 2023-02-15 DIAGNOSIS — Z823 Family history of stroke: Secondary | ICD-10-CM | POA: Diagnosis not present

## 2023-02-15 DIAGNOSIS — Z7982 Long term (current) use of aspirin: Secondary | ICD-10-CM | POA: Diagnosis not present

## 2023-02-15 DIAGNOSIS — Z8249 Family history of ischemic heart disease and other diseases of the circulatory system: Secondary | ICD-10-CM | POA: Diagnosis not present

## 2023-02-15 DIAGNOSIS — Z90710 Acquired absence of both cervix and uterus: Secondary | ICD-10-CM | POA: Diagnosis not present

## 2023-02-15 DIAGNOSIS — Z79899 Other long term (current) drug therapy: Secondary | ICD-10-CM | POA: Diagnosis not present

## 2023-02-15 DIAGNOSIS — Z8262 Family history of osteoporosis: Secondary | ICD-10-CM | POA: Diagnosis not present

## 2023-02-15 LAB — ECHOCARDIOGRAM COMPLETE
AR max vel: 2.25 cm2
AV Area VTI: 2.35 cm2
AV Area mean vel: 2.16 cm2
AV Mean grad: 5 mmHg
AV Peak grad: 8.9 mmHg
Ao pk vel: 1.5 m/s
Area-P 1/2: 3.17 cm2
Calc EF: 65.5 %
Height: 64 in
MV VTI: 2.95 cm2
P 1/2 time: 376 msec
S' Lateral: 2.4 cm
Single Plane A2C EF: 65.8 %
Single Plane A4C EF: 65.9 %
Weight: 2426.82 oz

## 2023-02-15 LAB — GLUCOSE, CAPILLARY
Glucose-Capillary: 220 mg/dL — ABNORMAL HIGH (ref 70–99)
Glucose-Capillary: 151 mg/dL — ABNORMAL HIGH (ref 70–99)
Glucose-Capillary: 188 mg/dL — ABNORMAL HIGH (ref 70–99)
Glucose-Capillary: 182 mg/dL — ABNORMAL HIGH (ref 70–99)
Glucose-Capillary: 199 mg/dL — ABNORMAL HIGH (ref 70–99)

## 2023-02-15 LAB — VITAMIN B12: Vitamin B-12: 1976 pg/mL — ABNORMAL HIGH (ref 180–914)

## 2023-02-15 LAB — HEMOGLOBIN A1C
Hgb A1c MFr Bld: >15.5 % — ABNORMAL HIGH (ref 4.8–5.6)
Mean Plasma Glucose: >398 mg/dL

## 2023-02-15 LAB — BASIC METABOLIC PANEL
Anion gap: 6 (ref 5–15)
BUN: 9 mg/dL (ref 8–23)
CO2: 24 mmol/L (ref 22–32)
Calcium: 8.8 mg/dL — ABNORMAL LOW (ref 8.9–10.3)
Chloride: 107 mmol/L (ref 98–111)
Creatinine, Ser: 0.67 mg/dL (ref 0.44–1.00)
GFR, Estimated: >60 mL/min (ref >60)
Glucose, Bld: 144 mg/dL — ABNORMAL HIGH (ref 70–99)
Potassium: 3.4 mmol/L — ABNORMAL LOW (ref 3.5–5.1)
Sodium: 137 mmol/L (ref 135–145)

## 2023-02-15 LAB — CBC
HCT: 34 % — ABNORMAL LOW (ref 36.0–46.0)
Hemoglobin: 11.5 g/dL — ABNORMAL LOW (ref 12.0–15.0)
MCH: 29.8 pg (ref 26.0–34.0)
MCHC: 33.8 g/dL (ref 30.0–36.0)
MCV: 88.1 fL (ref 80.0–100.0)
Platelets: 142 10*3/uL — ABNORMAL LOW (ref 150–400)
RBC: 3.86 MIL/uL — ABNORMAL LOW (ref 3.87–5.11)
RDW: 14 % (ref 11.5–15.5)
WBC: 8.2 10*3/uL (ref 4.0–10.5)
nRBC: 0 % (ref 0.0–0.2)

## 2023-02-15 MED ORDER — POTASSIUM CHLORIDE 20 MEQ PO PACK
40.0000 meq | PACK | Freq: Once | ORAL | Status: AC
  Administered 2023-02-15: 40 meq via ORAL
  Filled 2023-02-15: qty 2

## 2023-02-15 MED ORDER — SODIUM CHLORIDE 0.9 % IV SOLN: INTRAVENOUS | Status: DC

## 2023-02-15 MED ORDER — PEG 3350-KCL-NA BICARB-NACL 420 G PO SOLR
4000.0000 mL | Freq: Once | ORAL | Status: AC
  Administered 2023-02-15: 4000 mL via ORAL
  Filled 2023-02-15: qty 4000

## 2023-02-15 NOTE — Progress Notes (Signed)
Subjective: No complaints.  No further bleeding since her admission.  Objective: Vital signs in last 24 hours: Temp:  [97.4 F (36.3 C)-98.3 F (36.8 C)] 97.7 F (36.5 C) (06/27 1133) Pulse Rate:  [79-105] 84 (06/27 0600) Resp:  [15-34] 15 (06/27 0600) BP: (84-127)/(50-90) 110/70 (06/27 0600) SpO2:  [92 %-100 %] 96 % (06/27 0600) Last BM Date : 02/13/23  Intake/Output from previous day: 06/26 0701 - 06/27 0700 In: 2340.9 [I.V.:1522.5; IV Piggyback:818.4] Out: 400 [Urine:400] Intake/Output this shift: Total I/O In: -  Out: 400 [Urine:400]  General appearance: alert and no distress GI: soft, non-tender; bowel sounds normal; no masses,  no organomegaly  Lab Results: Recent Labs    02/13/23 1710 02/13/23 2016 02/14/23 0054 02/14/23 1447 02/14/23 2307 02/15/23 0302  WBC 11.5* 9.3  --   --   --  8.2  HGB 13.5 12.6   < > 11.5* 11.9* 11.5*  HCT 40.2 36.8   < > 34.0* 35.0* 34.0*  PLT 213 180  --   --   --  142*   < > = values in this interval not displayed.   BMET Recent Labs    02/14/23 0557 02/14/23 1224 02/15/23 0302  NA 133* 133* 137  K 2.9* 4.1 3.4*  CL 98 102 107  CO2 25 22 24  GLUCOSE 150* 136* 144*  BUN 10 9 9  CREATININE 0.75 0.67 0.67  CALCIUM 9.1 8.9 8.8*   LFT Recent Labs    02/13/23 1710  PROT 7.3  ALBUMIN 3.9  AST 21  ALT 20  ALKPHOS 48  BILITOT 2.2*   PT/INR No results for input(s): "LABPROT", "INR" in the last 72 hours. Hepatitis Panel No results for input(s): "HEPBSAG", "HCVAB", "HEPAIGM", "HEPBIGM" in the last 72 hours. C-Diff No results for input(s): "CDIFFTOX" in the last 72 hours. Fecal Lactopherrin No results for input(s): "FECLLACTOFRN" in the last 72 hours.  Studies/Results: ECHOCARDIOGRAM COMPLETE  Result Date: 02/15/2023    ECHOCARDIOGRAM REPORT   Patient Name:   Kindell J Reno Date of Exam: 02/15/2023 Medical Rec #:  [id number]       Height:       64.0 in Accession #:    [id number]      Weight:       151.7 lb Date of  Birth:  03/09/1941        BSA:          1.739 m Patient Age:    82 years        BP:           123/72 mmHg Patient Gender: F               HR:           92 bpm. Exam Location:  Inpatient Procedure: 2D Echo, Cardiac Doppler and Color Doppler Indications:    Abnormal EKG  History:        Patient has prior history of Echocardiogram examinations, most                 recent 07/22/2017. Arrythmias:atrial tachycardia; Risk                 Factors:Diabetes, Hypertension and Former Smoker.  Sonographer:    Molly Halloran Referring Phys: [id number] BELKYS A REGALADO  Sonographer Comments: Technically difficult study due to poor echo windows. IMPRESSIONS  1. Left ventricular ejection fraction, by estimation, is 60 to 65%. The left ventricle has normal function. The left ventricle has no   regional wall motion abnormalities. There is mild concentric left ventricular hypertrophy. Left ventricular diastolic parameters are consistent with Grade I diastolic dysfunction (impaired relaxation).  2. Right ventricular systolic function is normal. The right ventricular size is normal.  3. Left atrial size was mildly dilated.  4. The mitral valve is grossly normal. Trivial mitral valve regurgitation.  5. The aortic valve is tricuspid. There is mild calcification of the aortic valve. There is mild thickening of the aortic valve. Aortic valve regurgitation is trivial. Aortic valve sclerosis/calcification is present, without any evidence of aortic stenosis.  6. Aortic dilatation noted. There is borderline dilatation of the ascending aorta, measuring 39 mm. Comparison(s): No prior Echocardiogram. FINDINGS  Left Ventricle: Left ventricular ejection fraction, by estimation, is 60 to 65%. The left ventricle has normal function. The left ventricle has no regional wall motion abnormalities. The left ventricular internal cavity size was normal in size. There is  mild concentric left ventricular hypertrophy. Left ventricular diastolic parameters are  consistent with Grade I diastolic dysfunction (impaired relaxation). Right Ventricle: The right ventricular size is normal. No increase in right ventricular wall thickness. Right ventricular systolic function is normal. Left Atrium: Left atrial size was mildly dilated. Right Atrium: Right atrial size was normal in size. Pericardium: There is no evidence of pericardial effusion. Mitral Valve: The mitral valve is grossly normal. There is mild thickening of the mitral valve leaflet(s). There is mild calcification of the mitral valve leaflet(s). Trivial mitral valve regurgitation. MV peak gradient, 3.6 mmHg. The mean mitral valve gradient is 2.0 mmHg. Tricuspid Valve: The tricuspid valve is normal in structure. Tricuspid valve regurgitation is trivial. Aortic Valve: The aortic valve is tricuspid. There is mild calcification of the aortic valve. There is mild thickening of the aortic valve. Aortic valve regurgitation is trivial. Aortic regurgitation PHT measures 376 msec. Aortic valve sclerosis/calcification is present, without any evidence of aortic stenosis. Aortic valve mean gradient measures 5.0 mmHg. Aortic valve peak gradient measures 8.9 mmHg. Aortic valve area, by VTI measures 2.35 cm. Pulmonic Valve: The pulmonic valve was normal in structure. Pulmonic valve regurgitation is trivial. Aorta: Aortic dilatation noted. There is borderline dilatation of the ascending aorta, measuring 39 mm. IAS/Shunts: The atrial septum is grossly normal.  LEFT VENTRICLE PLAX 2D LVIDd:         3.60 cm     Diastology LVIDs:         2.40 cm     LV e' medial:    5.34 cm/s LV PW:         1.10 cm     LV E/e' medial:  16.0 LV IVS:        1.10 cm     LV e' lateral:   8.20 cm/s LVOT diam:     1.90 cm     LV E/e' lateral: 10.4 LV SV:         66 LV SV Index:   38 LVOT Area:     2.84 cm  LV Volumes (MOD) LV vol d, MOD A2C: 43.6 ml LV vol d, MOD A4C: 44.0 ml LV vol s, MOD A2C: 14.9 ml LV vol s, MOD A4C: 15.0 ml LV SV MOD A2C:     28.7 ml LV SV  MOD A4C:     44.0 ml LV SV MOD BP:      28.8 ml RIGHT VENTRICLE             IVC RV S prime:     19.00 cm/s    IVC diam: 1.30 cm TAPSE (M-mode): 1.8 cm LEFT ATRIUM             Index        RIGHT ATRIUM           Index LA diam:        3.60 cm 2.07 cm/m   RA Area:     14.50 cm LA Vol (A2C):   60.1 ml 34.55 ml/m  RA Volume:   31.20 ml  17.94 ml/m LA Vol (A4C):   53.2 ml 30.58 ml/m LA Biplane Vol: 59.5 ml 34.21 ml/m  AORTIC VALVE AV Area (Vmax):    2.25 cm AV Area (Vmean):   2.16 cm AV Area (VTI):     2.35 cm AV Vmax:           149.50 cm/s AV Vmean:          105.050 cm/s AV VTI:            0.279 m AV Peak Grad:      8.9 mmHg AV Mean Grad:      5.0 mmHg LVOT Vmax:         118.50 cm/s LVOT Vmean:        80.200 cm/s LVOT VTI:          0.232 m LVOT/AV VTI ratio: 0.83 AI PHT:            376 msec  AORTA Ao Root diam: 3.30 cm Ao Asc diam:  3.90 cm MITRAL VALVE MV Area (PHT): 3.17 cm     SHUNTS MV Area VTI:   2.95 cm     Systemic VTI:  0.23 m MV Peak grad:  3.6 mmHg     Systemic Diam: 1.90 cm MV Mean grad:  2.0 mmHg MV Vmax:       0.95 m/s MV Vmean:      70.4 cm/s MV Decel Time: 239 msec MV E velocity: 85.30 cm/s MV A velocity: 100.00 cm/s MV E/A ratio:  0.85 Heather Pemberton MD Electronically signed by Heather Pemberton MD Signature Date/Time: 02/15/2023/2:25:25 PM    Final    CT Angio Abd/Pel W and/or Wo Contrast  Result Date: 02/13/2023 CLINICAL DATA:  GI bleed. EXAM: CTA ABDOMEN AND PELVIS WITHOUT AND WITH CONTRAST TECHNIQUE: Multidetector CT imaging of the abdomen and pelvis was performed using the standard protocol during bolus administration of intravenous contrast. Multiplanar reconstructed images and MIPs were obtained and reviewed to evaluate the vascular anatomy. RADIATION DOSE REDUCTION: This exam was performed according to the departmental dose-optimization program which includes automated exposure control, adjustment of the mA and/or kV according to patient size and/or use of iterative reconstruction  technique. CONTRAST:  100mL OMNIPAQUE IOHEXOL 350 MG/ML SOLN COMPARISON:  CT abdomen pelvis dated 01/22/2023. FINDINGS: VASCULAR Aorta: Mild atherosclerotic calcification. No aneurysmal dilatation or dissection. No periaortic fluid collection. Celiac: The celiac trunk and its major branches are patent. SMA: Patent without evidence of aneurysm, dissection, vasculitis or significant stenosis. Renals: Both renal arteries are patent without evidence of aneurysm, dissection, vasculitis, fibromuscular dysplasia or significant stenosis. IMA: Patent without evidence of aneurysm, dissection, vasculitis or significant stenosis. Inflow: Mild atherosclerotic calcification. No aneurysmal dilatation or dissection. The iliac arteries are patent. Proximal Outflow: The visualized proximal outflow is patent. Veins: The IVC is unremarkable. The splenic vein, SMV, and main portal vein are patent. No portal venous gas. Review of the MIP images confirms the above findings. NON-VASCULAR Lower chest: The visualized lung bases are clear. No intra-abdominal   free air or free fluid. Hepatobiliary: Mild irregularity of the liver contour suspicious for changes of cirrhosis. Clinical correlation is recommended. No biliary dilatation. Cholecystectomy. No retained calcified stone noted in the central CBD. Pancreas: Unremarkable. No pancreatic ductal dilatation or surrounding inflammatory changes. Spleen: Normal in size without focal abnormality. Adrenals/Urinary Tract: The right adrenal gland is unremarkable. There is a 1.5 cm indeterminate left adrenal nodule, present dating back to 2018, likely an adenoma. Nonobstructing left renal calculi measure up to 7 mm in the inferior pole of the left kidney. There is no hydronephrosis on either side. There is symmetric enhancement of the kidneys. The visualized ureters and urinary bladder appear unremarkable. Stomach/Bowel: There is no bowel obstruction or active inflammation. No evidence of active GI  bleed. The appendix is normal. Lymphatic: There is no adenopathy. Reproductive: Hysterectomy.  No adnexal masses. Other: None Musculoskeletal: Degenerative changes of the spine. No acute osseous pathology. IMPRESSION: 1. No acute intra-abdominal or pelvic pathology. No evidence of active GI bleed. 2. No bowel obstruction. Normal appendix. 3. Nonobstructing left renal calculi. No hydronephrosis. 4. Mild irregularity of the liver contour suspicious for changes of cirrhosis. Electronically Signed   By: Arash  Radparvar M.D.   On: 02/13/2023 19:25    Medications: Scheduled:  Chlorhexidine Gluconate Cloth  6 each Topical Daily   cholecalciferol  1,000 Units Oral Daily   insulin aspart  0-5 Units Subcutaneous QHS   insulin aspart  0-9 Units Subcutaneous TID WC   insulin aspart  3 Units Subcutaneous TID WC   insulin detemir  15 Units Subcutaneous Daily   insulin starter kit- pen needles  1 kit Other Once   latanoprost  1 drop Both Eyes QHS   living well with diabetes book   Does not apply Once   montelukast  10 mg Oral QHS   omega-3 acid ethyl esters  1 g Oral Daily   pantoprazole  40 mg Oral Daily   polyethylene glycol-electrolytes  4,000 mL Oral Once   Continuous:  sodium chloride 100 mL/hr at 02/15/23 1042    Assessment/Plan: 1) Hematochezia. 2) Anemia.   The patient is stable.  Her HGB is stable after dropping down to the 11 g/dL range.  Her last colonoscopy was in 2018.  A repeat procedure will be performed for further evaluation.  Plan: 1) Colonoscopy at 7:30 AM tomorrow.  LOS: 0 days   Javiana Anwar D 02/15/2023, 3:30 PM 

## 2023-02-15 NOTE — Inpatient Diabetes Management (Signed)
Inpatient Diabetes Program Recommendations  AACE/ADA: New Consensus Statement on Inpatient Glycemic Control (2015)  Target Ranges:  Prepandial:   less than 140 mg/dL      Peak postprandial:   less than 180 mg/dL (1-2 hours)      Critically ill patients:  140 - 180 mg/dL   Lab Results  Component Value Date   GLUCAP 188 (H) 02/15/2023   HGBA1C >15.5 (H) 02/14/2023    Review of Glycemic Control  Latest Reference Range & Units 02/14/23 12:28 02/14/23 16:40 02/14/23 21:41 02/15/23 07:50 02/15/23 11:14  Glucose-Capillary 70 - 99 mg/dL 161 (H) 096 (H) 045 (H) 151 (H) 188 (H)   Diabetes history: DM 2 Outpatient Diabetes medications:  Metformin 850 mg tid Current orders for Inpatient glycemic control:  Novolog 0-9 units tid with meals and HS Levemir 15 units daily Novolog 3 units tid with meals  Inpatient Diabetes Program Recommendations:    Spoke briefly with patient regarding DM and plans to start FS libre prior to discharge.  Patient is awaiting plan re. Colonoscopy.  Will follow up on 6/28.   Thanks,  Beryl Meager, RN, BC-ADM Inpatient Diabetes Coordinator Pager [phone]  (8a-5p)

## 2023-02-15 NOTE — Plan of Care (Signed)

## 2023-02-15 NOTE — Progress Notes (Signed)
   02/15/23 1140  Orthostatic Lying   BP- Lying 107/56  Pulse- Lying 93  Orthostatic Sitting  BP- Sitting (!) 136/96  Pulse- Sitting 101  Orthostatic Standing at 3 minutes  BP- Standing at 3 minutes (!) 162/116  Pulse- Standing at 3 minutes 100

## 2023-02-15 NOTE — Telephone Encounter (Signed)
Pharmacy Patient Advocate Encounter  Received notification from [facility] that Prior Authorization for FreeStyle Libre 3 Sensor  has been APPROVED from 02/14/2023 to 08/21/2023.Marland Kitchen  PA #/Case ID/Reference #: [id number]  Copay is $0.00

## 2023-02-15 NOTE — Progress Notes (Addendum)
PROGRESS NOTE    Sabrina Aguirre  MVH:[id number] DOB: Jan 30, 1941 DOA: 02/13/2023 PCP: Lula Olszewski, MD   Brief Narrative: 82 year old with past medical history significant for diabetes type 2, hypertension presents to the ED with complaint of rectal bleeding ongoing for some time, patient reported bright red blood per rectum.  She also reports loose stool. She has had some cough. She has recently diagnose with ear infection, her PC was having concern with mastoiditis, and prescribe another 7 days of antibiotics on June 20th. She has been evaluated by her PCP for dizziness, she even had MRI brain 6/03: negative for stroke.   CT angio abdomen and pelvis done 6/25: No acute intra-abdominal or pelvic pathology. No evidence of active GI bleed.   Assessment & Plan:   Principal Problem:   BRBPR (bright red blood per rectum) Active Problems:   Uncontrolled type 2 diabetes mellitus with hyperglycemia, without long-term current use of insulin (HCC)   High anion gap metabolic acidosis   Hypokalemia   1-DKA; Diabetes type 2 Patient presents with CBG 500, gap of 25.  Bicarb of 16, beta butyrate acid 8 She was treated with insulin drip and IV fluids Anion gap is closed, down to 9, bicarb 22, blood glucose 136 Continue with Levemir 15 units daily. SSI. 3 units novolog meals coverage.  A1c. More than 15.  Previously taking metformin.  She will need to be discharge on Insulin.   2-Hypokalemia; Replete orally.   3-Hyponatremia, and pseudohyponatremia in setting hyperglycemia dn dehydration.  Improved with IV fluids and correction. Resolved.   4-Dizziness; She has had MRI brain done on 6/3 for same negative for stroke.  Suspect related to hyperglycemia, dehydration.   -PT for vestibular evaluation.  -She had recent tx for ear infection.  -Check ECHO.  -Improved. Orthostatic vitals today negative.  -Will check B 12  5-GI bleed; BRB per rectum;  Continue to monitor hb. Down to 11 form  13 GI has been consulted. GI considering colonoscopy inpatient.  CTA negative for bleeding.  Continue to monitor hb.   6-Hallucination, likely in setting delirium, hyperglycemia. Resolved.  She reporter seen red truck in her room.   Hypotension;  Suspect related to dehydration in setting of hyperglycemia.  Improved with IV fluids.  Hold Home BP medications.  BP improved.   Recent ear infection.  She has left 2 more days of antibiotics. Wont resume antibiotics. She has now completed 15 days antibiotics.   Diarrhea; resolved. No further BM   See wound documentation below.  Pressure Injury 02/13/23 Rectum Medial;Upper Stage 2 -  Partial thickness loss of dermis presenting as a shallow open injury with a red, pink wound bed without slough. (Active)  02/13/23 2200  Location: Rectum  Location Orientation: Medial;Upper  Staging: Stage 2 -  Partial thickness loss of dermis presenting as a shallow open injury with a red, pink wound bed without slough.  Wound Description (Comments):   Present on Admission: Yes  Dressing Type Foam - Lift dressing to assess site every shift 02/14/23 2000     Estimated body mass index is 26.04 kg/m as calculated from the following:   Height as of this encounter: 5\' 4"  (1.626 m).   Weight as of this encounter: 68.8 kg.   DVT prophylaxis: SCD Code Status: Full code Family Communication: Daughter at bedside.6/26  Disposition Plan:  Status is: Observation The patient remains OBS appropriate and will d/c before 2 midnights.    Consultants:  GI, Dr Man.  Procedures:    Antimicrobials:    Subjective: Dizziness is better. No further bloody bowel movement. Denies pain.     Objective: Vitals:   02/15/23 0300 02/15/23 0400 02/15/23 0500 02/15/23 0600  BP: 111/72 110/76 122/70 110/70  Pulse: 85 79 80 84  Resp: 17 16 15 15   Temp:  97.8 F (36.6 C)    TempSrc:  Oral    SpO2: 100% 97% 96% 96%  Weight:      Height:        Intake/Output  Summary (Last 24 hours) at 02/15/2023 0834 Last data filed at 02/15/2023 0400 Gross per 24 hour  Intake 2250.46 ml  Output 400 ml  Net 1850.46 ml    Filed Weights   02/13/23 2200  Weight: 68.8 kg    Examination:  General exam: NAD Respiratory system: CTA Cardiovascular system: S 1, S 2 RRR Gastrointestinal system: BS present, soft, nt Central nervous system: Alert, non focal.  Extremities: no edema    Data Reviewed: I have personally reviewed following labs and imaging studies  CBC: Recent Labs  Lab 02/13/23 1710 02/13/23 2016 02/14/23 0054 02/14/23 0311 02/14/23 0557 02/14/23 1447 02/14/23 2307 02/15/23 0302  WBC 11.5* 9.3  --   --   --   --   --  8.2  NEUTROABS  --  8.2*  --   --   --   --   --   --   HGB 13.5 12.6   < > 11.8* 11.8* 11.5* 11.9* 11.5*  HCT 40.2 36.8   < > 35.1* 34.4* 34.0* 35.0* 34.0*  MCV 87.0 86.4  --   --   --   --   --  88.1  PLT 213 180  --   --   --   --   --  142*   < > = values in this interval not displayed.    Basic Metabolic Panel: Recent Labs  Lab 02/14/23 0054 02/14/23 0311 02/14/23 0557 02/14/23 1224 02/15/23 0302  NA 130* 132* 133* 133* 137  K 2.8* 2.7* 2.9* 4.1 3.4*  CL 92* 96* 98 102 107  CO2 21* 21* 25 22 24   GLUCOSE 135* 177* 150* 136* 144*  BUN 12 12 10 9 9   CREATININE 0.82 0.86 0.75 0.67 0.67  CALCIUM 9.1 8.9 9.1 8.9 8.8*  MG 1.5*  --   --   --   --     GFR: Estimated Creatinine Clearance: 50.7 mL/min (by C-G formula based on SCr of 0.67 mg/dL). Liver Function Tests: Recent Labs  Lab 02/13/23 1710  AST 21  ALT 20  ALKPHOS 48  BILITOT 2.2*  PROT 7.3  ALBUMIN 3.9    No results for input(s): "LIPASE", "AMYLASE" in the last 168 hours. No results for input(s): "AMMONIA" in the last 168 hours. Coagulation Profile: No results for input(s): "INR", "PROTIME" in the last 168 hours. Cardiac Enzymes: No results for input(s): "CKTOTAL", "CKMB", "CKMBINDEX", "TROPONINI" in the last 168 hours. BNP (last 3  results) No results for input(s): "PROBNP" in the last 8760 hours. HbA1C: No results for input(s): "HGBA1C" in the last 72 hours. CBG: Recent Labs  Lab 02/14/23 1002 02/14/23 1228 02/14/23 1640 02/14/23 2141 02/15/23 0750  GLUCAP 120* 126* 311* 220* 151*    Lipid Profile: No results for input(s): "CHOL", "HDL", "LDLCALC", "TRIG", "CHOLHDL", "LDLDIRECT" in the last 72 hours. Thyroid Function Tests: No results for input(s): "TSH", "T4TOTAL", "FREET4", "T3FREE", "THYROIDAB" in the last 72 hours. Anemia Panel: No  results for input(s): "VITAMINB12", "FOLATE", "FERRITIN", "TIBC", "IRON", "RETICCTPCT" in the last 72 hours. Sepsis Labs: Recent Labs  Lab 02/13/23 2016 02/14/23 0054  LATICACIDVEN 1.6 2.0*     No results found for this or any previous visit (from the past 240 hour(s)).       Radiology Studies: CT Angio Abd/Pel W and/or Wo Contrast  Result Date: 02/13/2023 CLINICAL DATA:  GI bleed. EXAM: CTA ABDOMEN AND PELVIS WITHOUT AND WITH CONTRAST TECHNIQUE: Multidetector CT imaging of the abdomen and pelvis was performed using the standard protocol during bolus administration of intravenous contrast. Multiplanar reconstructed images and MIPs were obtained and reviewed to evaluate the vascular anatomy. RADIATION DOSE REDUCTION: This exam was performed according to the departmental dose-optimization program which includes automated exposure control, adjustment of the mA and/or kV according to patient size and/or use of iterative reconstruction technique. CONTRAST:  OMNIPAQUE IOHEXOL 350 MG/ML SOLN COMPARISON:  CT abdomen pelvis dated 01/22/2023. FINDINGS: VASCULAR Aorta: Mild atherosclerotic calcification. No aneurysmal dilatation or dissection. No periaortic fluid collection. Celiac: The celiac trunk and its major branches are patent. SMA: Patent without evidence of aneurysm, dissection, vasculitis or significant stenosis. Renals: Both renal arteries are patent without evidence  of aneurysm, dissection, vasculitis, fibromuscular dysplasia or significant stenosis. IMA: Patent without evidence of aneurysm, dissection, vasculitis or significant stenosis. Inflow: Mild atherosclerotic calcification. No aneurysmal dilatation or dissection. The iliac arteries are patent. Proximal Outflow: The visualized proximal outflow is patent. Veins: The IVC is unremarkable. The splenic vein, SMV, and main portal vein are patent. No portal venous gas. Review of the MIP images confirms the above findings. NON-VASCULAR Lower chest: The visualized lung bases are clear. No intra-abdominal free air or free fluid. Hepatobiliary: Mild irregularity of the liver contour suspicious for changes of cirrhosis. Clinical correlation is recommended. No biliary dilatation. Cholecystectomy. No retained calcified stone noted in the central CBD. Pancreas: Unremarkable. No pancreatic ductal dilatation or surrounding inflammatory changes. Spleen: Normal in size without focal abnormality. Adrenals/Urinary Tract: The right adrenal gland is unremarkable. There is a 1.5 cm indeterminate left adrenal nodule, present dating back to 2018, likely an adenoma. Nonobstructing left renal calculi measure up to 7 mm in the inferior pole of the left kidney. There is no hydronephrosis on either side. There is symmetric enhancement of the kidneys. The visualized ureters and urinary bladder appear unremarkable. Stomach/Bowel: There is no bowel obstruction or active inflammation. No evidence of active GI bleed. The appendix is normal. Lymphatic: There is no adenopathy. Reproductive: Hysterectomy.  No adnexal masses. Other: None Musculoskeletal: Degenerative changes of the spine. No acute osseous pathology. IMPRESSION: 1. No acute intra-abdominal or pelvic pathology. No evidence of active GI bleed. 2. No bowel obstruction. Normal appendix. 3. Nonobstructing left renal calculi. No hydronephrosis. 4. Mild irregularity of the liver contour suspicious for  changes of cirrhosis. Electronically Signed   By: Elgie Collard M.D.   On: 02/13/2023 19:25        Scheduled Meds:  Chlorhexidine Gluconate Cloth  6 each Topical Daily   cholecalciferol  1,000 Units Oral Daily   insulin aspart  0-5 Units Subcutaneous QHS   insulin aspart  0-9 Units Subcutaneous TID WC   insulin aspart  3 Units Subcutaneous TID WC   insulin detemir  15 Units Subcutaneous Daily   insulin starter kit- pen needles  1 kit Other Once   latanoprost  1 drop Both Eyes QHS   living well with diabetes book   Does not apply Once  montelukast  10 mg Oral QHS   omega-3 acid ethyl esters  1 g Oral Daily   pantoprazole  40 mg Oral Daily   potassium chloride  40 mEq Oral Once   Continuous Infusions:  sodium chloride Stopped (02/15/23 0316)     LOS: 0 days    Time spent: 35 minutes    Kalan Yeley A Kecia Swoboda, MD [facility]   If 7PM-7AM, please contact night-coverage [email or web address]  02/15/2023, 8:34 AM

## 2023-02-15 NOTE — H&P (View-Only) (Signed)
Subjective: No complaints.  No further bleeding since her admission.  Objective: Vital signs in last 24 hours: Temp:  [97.4 F (36.3 C)-98.3 F (36.8 C)] 97.7 F (36.5 C) (06/27 1133) Pulse Rate:  [79-105] 84 (06/27 0600) Resp:  [15-34] 15 (06/27 0600) BP: (84-127)/(50-90) 110/70 (06/27 0600) SpO2:  [92 %-100 %] 96 % (06/27 0600) Last BM Date : 02/13/23  Intake/Output from previous day: 06/26 0701 - 06/27 0700 In: 2340.9 [I.V.:1522.5; IV Piggyback:818.4] Out: 400 [Urine:400] Intake/Output this shift: Total I/O In: -  Out: 400 [Urine:400]  General appearance: alert and no distress GI: soft, non-tender; bowel sounds normal; no masses,  no organomegaly  Lab Results: Recent Labs    02/13/23 1710 02/13/23 2016 02/14/23 0054 02/14/23 1447 02/14/23 2307 02/15/23 0302  WBC 11.5* 9.3  --   --   --  8.2  HGB 13.5 12.6   < > 11.5* 11.9* 11.5*  HCT 40.2 36.8   < > 34.0* 35.0* 34.0*  PLT 213 180  --   --   --  142*   < > = values in this interval not displayed.   BMET Recent Labs    02/14/23 0557 02/14/23 1224 02/15/23 0302  NA 133* 133* 137  K 2.9* 4.1 3.4*  CL 98 102 107  CO2 25 22 24   GLUCOSE 150* 136* 144*  BUN 10 9 9   CREATININE 0.75 0.67 0.67  CALCIUM 9.1 8.9 8.8*   LFT Recent Labs    02/13/23 1710  PROT 7.3  ALBUMIN 3.9  AST 21  ALT 20  ALKPHOS 48  BILITOT 2.2*   PT/INR No results for input(s): "LABPROT", "INR" in the last 72 hours. Hepatitis Panel No results for input(s): "HEPBSAG", "HCVAB", "HEPAIGM", "HEPBIGM" in the last 72 hours. C-Diff No results for input(s): "CDIFFTOX" in the last 72 hours. Fecal Lactopherrin No results for input(s): "FECLLACTOFRN" in the last 72 hours.  Studies/Results: ECHOCARDIOGRAM COMPLETE  Result Date: 02/15/2023    ECHOCARDIOGRAM REPORT   Patient Name:   Sabrina Aguirre Date of Exam: 02/15/2023 Medical Rec #:  [id number]       Height:       64.0 in Accession #:    [id number]      Weight:       151.7 lb Date of  Birth:  Dec 18, 1940        BSA:          1.739 m Patient Age:    82 years        BP:           123/72 mmHg Patient Gender: F               HR:           92 bpm. Exam Location:  Inpatient Procedure: 2D Echo, Cardiac Doppler and Color Doppler Indications:    Abnormal EKG  History:        Patient has prior history of Echocardiogram examinations, most                 recent 07/22/2017. Arrythmias:atrial tachycardia; Risk                 Factors:Diabetes, Hypertension and Former Smoker.  Sonographer:    Wallie Char Referring Phys: [phone] BELKYS A REGALADO  Sonographer Comments: Technically difficult study due to poor echo windows. IMPRESSIONS  1. Left ventricular ejection fraction, by estimation, is 60 to 65%. The left ventricle has normal function. The left ventricle has no  regional wall motion abnormalities. There is mild concentric left ventricular hypertrophy. Left ventricular diastolic parameters are consistent with Grade I diastolic dysfunction (impaired relaxation).  2. Right ventricular systolic function is normal. The right ventricular size is normal.  3. Left atrial size was mildly dilated.  4. The mitral valve is grossly normal. Trivial mitral valve regurgitation.  5. The aortic valve is tricuspid. There is mild calcification of the aortic valve. There is mild thickening of the aortic valve. Aortic valve regurgitation is trivial. Aortic valve sclerosis/calcification is present, without any evidence of aortic stenosis.  6. Aortic dilatation noted. There is borderline dilatation of the ascending aorta, measuring 39 mm. Comparison(s): No prior Echocardiogram. FINDINGS  Left Ventricle: Left ventricular ejection fraction, by estimation, is 60 to 65%. The left ventricle has normal function. The left ventricle has no regional wall motion abnormalities. The left ventricular internal cavity size was normal in size. There is  mild concentric left ventricular hypertrophy. Left ventricular diastolic parameters are  consistent with Grade I diastolic dysfunction (impaired relaxation). Right Ventricle: The right ventricular size is normal. No increase in right ventricular wall thickness. Right ventricular systolic function is normal. Left Atrium: Left atrial size was mildly dilated. Right Atrium: Right atrial size was normal in size. Pericardium: There is no evidence of pericardial effusion. Mitral Valve: The mitral valve is grossly normal. There is mild thickening of the mitral valve leaflet(s). There is mild calcification of the mitral valve leaflet(s). Trivial mitral valve regurgitation. MV peak gradient, 3.6 mmHg. The mean mitral valve gradient is 2.0 mmHg. Tricuspid Valve: The tricuspid valve is normal in structure. Tricuspid valve regurgitation is trivial. Aortic Valve: The aortic valve is tricuspid. There is mild calcification of the aortic valve. There is mild thickening of the aortic valve. Aortic valve regurgitation is trivial. Aortic regurgitation PHT measures 376 msec. Aortic valve sclerosis/calcification is present, without any evidence of aortic stenosis. Aortic valve mean gradient measures 5.0 mmHg. Aortic valve peak gradient measures 8.9 mmHg. Aortic valve area, by VTI measures 2.35 cm. Pulmonic Valve: The pulmonic valve was normal in structure. Pulmonic valve regurgitation is trivial. Aorta: Aortic dilatation noted. There is borderline dilatation of the ascending aorta, measuring 39 mm. IAS/Shunts: The atrial septum is grossly normal.  LEFT VENTRICLE PLAX 2D LVIDd:         3.60 cm     Diastology LVIDs:         2.40 cm     LV e' medial:    5.34 cm/s LV PW:         1.10 cm     LV E/e' medial:  16.0 LV IVS:        1.10 cm     LV e' lateral:   8.20 cm/s LVOT diam:     1.90 cm     LV E/e' lateral: 10.4 LV SV:         66 LV SV Index:   38 LVOT Area:     2.84 cm  LV Volumes (MOD) LV vol d, MOD A2C: 43.6 ml LV vol d, MOD A4C: 44.0 ml LV vol s, MOD A2C: 14.9 ml LV vol s, MOD A4C: 15.0 ml LV SV MOD A2C:     28.7 ml LV SV  MOD A4C:     44.0 ml LV SV MOD BP:      28.8 ml RIGHT VENTRICLE             IVC RV S prime:     19.00 cm/s  IVC diam: 1.30 cm TAPSE (M-mode): 1.8 cm LEFT ATRIUM             Index        RIGHT ATRIUM           Index LA diam:        3.60 cm 2.07 cm/m   RA Area:     14.50 cm LA Vol (A2C):   60.1 ml 34.55 ml/m  RA Volume:   31.20 ml  17.94 ml/m LA Vol (A4C):   53.2 ml 30.58 ml/m LA Biplane Vol: 59.5 ml 34.21 ml/m  AORTIC VALVE AV Area (Vmax):    2.25 cm AV Area (Vmean):   2.16 cm AV Area (VTI):     2.35 cm AV Vmax:           149.50 cm/s AV Vmean:          105.050 cm/s AV VTI:            0.279 m AV Peak Grad:      8.9 mmHg AV Mean Grad:      5.0 mmHg LVOT Vmax:         118.50 cm/s LVOT Vmean:        80.200 cm/s LVOT VTI:          0.232 m LVOT/AV VTI ratio: 0.83 AI PHT:            376 msec  AORTA Ao Root diam: 3.30 cm Ao Asc diam:  3.90 cm MITRAL VALVE MV Area (PHT): 3.17 cm     SHUNTS MV Area VTI:   2.95 cm     Systemic VTI:  0.23 m MV Peak grad:  3.6 mmHg     Systemic Diam: 1.90 cm MV Mean grad:  2.0 mmHg MV Vmax:       0.95 m/s MV Vmean:      70.4 cm/s MV Decel Time: 239 msec MV E velocity: 85.30 cm/s MV A velocity: 100.00 cm/s MV E/A ratio:  0.85 Laurance Flatten MD Electronically signed by Laurance Flatten MD Signature Date/Time: 02/15/2023/2:25:25 PM    Final    CT Angio Abd/Pel W and/or Wo Contrast  Result Date: 02/13/2023 CLINICAL DATA:  GI bleed. EXAM: CTA ABDOMEN AND PELVIS WITHOUT AND WITH CONTRAST TECHNIQUE: Multidetector CT imaging of the abdomen and pelvis was performed using the standard protocol during bolus administration of intravenous contrast. Multiplanar reconstructed images and MIPs were obtained and reviewed to evaluate the vascular anatomy. RADIATION DOSE REDUCTION: This exam was performed according to the departmental dose-optimization program which includes automated exposure control, adjustment of the mA and/or kV according to patient size and/or use of iterative reconstruction  technique. CONTRAST:  OMNIPAQUE IOHEXOL 350 MG/ML SOLN COMPARISON:  CT abdomen pelvis dated 01/22/2023. FINDINGS: VASCULAR Aorta: Mild atherosclerotic calcification. No aneurysmal dilatation or dissection. No periaortic fluid collection. Celiac: The celiac trunk and its major branches are patent. SMA: Patent without evidence of aneurysm, dissection, vasculitis or significant stenosis. Renals: Both renal arteries are patent without evidence of aneurysm, dissection, vasculitis, fibromuscular dysplasia or significant stenosis. IMA: Patent without evidence of aneurysm, dissection, vasculitis or significant stenosis. Inflow: Mild atherosclerotic calcification. No aneurysmal dilatation or dissection. The iliac arteries are patent. Proximal Outflow: The visualized proximal outflow is patent. Veins: The IVC is unremarkable. The splenic vein, SMV, and main portal vein are patent. No portal venous gas. Review of the MIP images confirms the above findings. NON-VASCULAR Lower chest: The visualized lung bases are clear. No intra-abdominal  free air or free fluid. Hepatobiliary: Mild irregularity of the liver contour suspicious for changes of cirrhosis. Clinical correlation is recommended. No biliary dilatation. Cholecystectomy. No retained calcified stone noted in the central CBD. Pancreas: Unremarkable. No pancreatic ductal dilatation or surrounding inflammatory changes. Spleen: Normal in size without focal abnormality. Adrenals/Urinary Tract: The right adrenal gland is unremarkable. There is a 1.5 cm indeterminate left adrenal nodule, present dating back to 2018, likely an adenoma. Nonobstructing left renal calculi measure up to 7 mm in the inferior pole of the left kidney. There is no hydronephrosis on either side. There is symmetric enhancement of the kidneys. The visualized ureters and urinary bladder appear unremarkable. Stomach/Bowel: There is no bowel obstruction or active inflammation. No evidence of active GI  bleed. The appendix is normal. Lymphatic: There is no adenopathy. Reproductive: Hysterectomy.  No adnexal masses. Other: None Musculoskeletal: Degenerative changes of the spine. No acute osseous pathology. IMPRESSION: 1. No acute intra-abdominal or pelvic pathology. No evidence of active GI bleed. 2. No bowel obstruction. Normal appendix. 3. Nonobstructing left renal calculi. No hydronephrosis. 4. Mild irregularity of the liver contour suspicious for changes of cirrhosis. Electronically Signed   By: Elgie Collard M.D.   On: 02/13/2023 19:25    Medications: Scheduled:  Chlorhexidine Gluconate Cloth  6 each Topical Daily   cholecalciferol  1,000 Units Oral Daily   insulin aspart  0-5 Units Subcutaneous QHS   insulin aspart  0-9 Units Subcutaneous TID WC   insulin aspart  3 Units Subcutaneous TID WC   insulin detemir  15 Units Subcutaneous Daily   insulin starter kit- pen needles  1 kit Other Once   latanoprost  1 drop Both Eyes QHS   living well with diabetes book   Does not apply Once   montelukast  10 mg Oral QHS   omega-3 acid ethyl esters  1 g Oral Daily   pantoprazole  40 mg Oral Daily   polyethylene glycol-electrolytes  4,000 mL Oral Once   Continuous:  sodium chloride 100 mL/hr at 02/15/23 1042    Assessment/Plan: 1) Hematochezia. 2) Anemia.   The patient is stable.  Her HGB is stable after dropping down to the 11 g/dL range.  Her last colonoscopy was in 2018.  A repeat procedure will be performed for further evaluation.  Plan: 1) Colonoscopy at 7:30 AM tomorrow.  LOS: 0 days   Jacquilyn Seldon D 02/15/2023, 3:30 PM

## 2023-02-15 NOTE — Plan of Care (Signed)
  Problem: Coping: Goal: Ability to adjust to condition or change in health will improve Outcome: Progressing   Problem: Fluid Volume: Goal: Ability to maintain a balanced intake and output will improve Outcome: Progressing   

## 2023-02-15 NOTE — Progress Notes (Signed)
PT Cancellation Note  Patient Details Name: Sabrina Aguirre MRN: [id number] DOB: Jul 13, 1941   Cancelled Treatment:    Reason Eval/Treat Not Completed: Other (comment) Pt eating lunch (reports received late) and wishes to continue eating.  Pt then expected to transfer from SDU to a floor unit.  Will check back as schedule permits.   Kati L Payson 02/15/2023, 3:00 PM Paulino Door, DPT Physical Therapist Acute Rehabilitation Services Office: [phone]

## 2023-02-16 ENCOUNTER — Encounter (HOSPITAL_COMMUNITY)
Admission: EM | Disposition: A | Discharge status: Home Health | Payer: Self-pay | Source: Home / Self Care | Attending: Internal Medicine

## 2023-02-16 ENCOUNTER — Other Ambulatory Visit: Payer: Self-pay

## 2023-02-16 ENCOUNTER — Inpatient Hospital Stay (HOSPITAL_COMMUNITY): Payer: Medicare PPO | Admitting: Certified Registered"

## 2023-02-16 DIAGNOSIS — K635 Polyp of colon: Secondary | ICD-10-CM

## 2023-02-16 DIAGNOSIS — K625 Hemorrhage of anus and rectum: Secondary | ICD-10-CM | POA: Diagnosis not present

## 2023-02-16 DIAGNOSIS — K5730 Diverticulosis of large intestine without perforation or abscess without bleeding: Secondary | ICD-10-CM

## 2023-02-16 HISTORY — PX: COLONOSCOPY WITH PROPOFOL: SHX5780

## 2023-02-16 HISTORY — PX: POLYPECTOMY: SHX5525

## 2023-02-16 LAB — BASIC METABOLIC PANEL
Anion gap: 8 (ref 5–15)
BUN: <5 mg/dL — ABNORMAL LOW (ref 8–23)
CO2: 22 mmol/L (ref 22–32)
Calcium: 8.5 mg/dL — ABNORMAL LOW (ref 8.9–10.3)
Chloride: 108 mmol/L (ref 98–111)
Creatinine, Ser: 0.6 mg/dL (ref 0.44–1.00)
GFR, Estimated: >60 mL/min (ref >60)
Glucose, Bld: 217 mg/dL — ABNORMAL HIGH (ref 70–99)
Potassium: 3.7 mmol/L (ref 3.5–5.1)
Sodium: 138 mmol/L (ref 135–145)

## 2023-02-16 LAB — GLUCOSE, CAPILLARY
Glucose-Capillary: 150 mg/dL — ABNORMAL HIGH (ref 70–99)
Glucose-Capillary: 202 mg/dL — ABNORMAL HIGH (ref 70–99)
Glucose-Capillary: 141 mg/dL — ABNORMAL HIGH (ref 70–99)
Glucose-Capillary: 93 mg/dL (ref 70–99)

## 2023-02-16 SURGERY — COLONOSCOPY WITH PROPOFOL
Anesthesia: Monitor Anesthesia Care

## 2023-02-16 MED ORDER — MAGNESIUM OXIDE -MG SUPPLEMENT 400 (240 MG) MG PO TABS: 400.0000 mg | ORAL_TABLET | Freq: Two times a day (BID) | ORAL | 0 refills | Status: AC

## 2023-02-16 MED ORDER — FREESTYLE LIBRE 3 SENSOR MISC: 1.0000 | Freq: Every day | 0 refills | Status: DC

## 2023-02-16 MED ORDER — METOPROLOL SUCCINATE ER 50 MG PO TB24
50.0000 mg | ORAL_TABLET | Freq: Every day | ORAL | Status: DC
  Administered 2023-02-16 – 2023-02-17 (×2): 50 mg via ORAL
  Filled 2023-02-16 (×2): qty 1

## 2023-02-16 MED ORDER — LANCET DEVICE MISC
1.0000 | Freq: Three times a day (TID) | 0 refills | Status: AC
Start: 2023-02-16 — End: 2023-03-18

## 2023-02-16 MED ORDER — PROPOFOL 500 MG/50ML IV EMUL
INTRAVENOUS | Status: DC | PRN
  Administered 2023-02-16: 75 ug/kg/min via INTRAVENOUS

## 2023-02-16 MED ORDER — INSULIN ASPART 100 UNIT/ML FLEXPEN: 3.0000 [IU] | PEN_INJECTOR | Freq: Three times a day (TID) | SUBCUTANEOUS | 11 refills | Status: AC

## 2023-02-16 MED ORDER — LACTATED RINGERS IV SOLN: INTRAVENOUS | Status: DC

## 2023-02-16 MED ORDER — INSULIN GLARGINE 100 UNIT/ML SOLOSTAR PEN: 15.0000 [IU] | PEN_INJECTOR | Freq: Every day | SUBCUTANEOUS | 11 refills | Status: AC

## 2023-02-16 MED ORDER — BLOOD GLUCOSE METER KIT: PACK | 0 refills | Status: AC

## 2023-02-16 MED ORDER — MAGNESIUM SULFATE 2 GM/50ML IV SOLN
2.0000 g | Freq: Once | INTRAVENOUS | Status: AC
  Administered 2023-02-16: 2 g via INTRAVENOUS
  Filled 2023-02-16: qty 50

## 2023-02-16 MED ORDER — BLOOD GLUCOSE MONITORING SUPPL DEVI
1.0000 | Freq: Three times a day (TID) | 0 refills | Status: AC
Start: 2023-02-16 — End: ?

## 2023-02-16 MED ORDER — SODIUM CHLORIDE 0.9 % IV SOLN: INTRAVENOUS | Status: AC

## 2023-02-16 MED ORDER — METOPROLOL SUCCINATE ER 100 MG PO TB24: 50.0000 mg | ORAL_TABLET | Freq: Every day | ORAL | 3 refills | Status: DC

## 2023-02-16 MED ORDER — LACTATED RINGERS IV SOLN: INTRAVENOUS | Status: DC | PRN

## 2023-02-16 MED ORDER — LIDOCAINE HCL (CARDIAC) PF 100 MG/5ML IV SOSY
PREFILLED_SYRINGE | INTRAVENOUS | Status: DC | PRN
  Administered 2023-02-16: 80 mg via INTRATRACHEAL

## 2023-02-16 MED ORDER — MAGNESIUM OXIDE -MG SUPPLEMENT 400 (240 MG) MG PO TABS
400.0000 mg | ORAL_TABLET | Freq: Two times a day (BID) | ORAL | Status: DC
  Administered 2023-02-16 – 2023-02-17 (×2): 400 mg via ORAL
  Filled 2023-02-16 (×2): qty 1

## 2023-02-16 MED ORDER — FREESTYLE LIBRE 3 READER DEVI: 1.0000 | Freq: Every day | 2 refills | Status: AC

## 2023-02-16 MED ORDER — LIVING WELL WITH DIABETES BOOK
Freq: Once | Status: AC
  Administered 2023-02-16: 1
  Filled 2023-02-16: qty 1

## 2023-02-16 MED ORDER — FOLIC ACID 1 MG PO TABS
1.0000 mg | ORAL_TABLET | Freq: Every day | ORAL | Status: DC
  Administered 2023-02-16 – 2023-02-17 (×2): 1 mg via ORAL
  Filled 2023-02-16 (×2): qty 1

## 2023-02-16 SURGICAL SUPPLY — 22 items

## 2023-02-16 NOTE — Plan of Care (Signed)

## 2023-02-16 NOTE — Anesthesia Preprocedure Evaluation (Addendum)
Anesthesia Evaluation  Patient identified by MRN, date of birth, ID band Patient awake    Reviewed: Allergy & Precautions, NPO status , Patient's Chart, lab work & pertinent test results  Airway Mallampati: II  TM Distance: >3 FB Neck ROM: Full    Dental no notable dental hx. (+) Partial Upper   Pulmonary asthma , former smoker   Pulmonary exam normal        Cardiovascular hypertension, Pt. on medications and Pt. on home beta blockers  Rhythm:Regular Rate:Normal     Neuro/Psych  Headaches   Depression       GI/Hepatic Neg liver ROS,GERD  Medicated,,  Endo/Other  diabetes, Type 2, Oral Hypoglycemic Agents    Renal/GU negative Renal ROS  negative genitourinary   Musculoskeletal  (+) Arthritis , Osteoarthritis,    Abdominal Normal abdominal exam  (+)   Peds  Hematology Lab Results      Component                Value               Date                      WBC                      8.2                 02/15/2023                HGB                      11.5 (L)            02/15/2023                HCT                      34.0 (L)            02/15/2023                MCV                      88.1                02/15/2023                PLT                      142 (L)             02/15/2023             Lab Results      Component                Value               Date                      NA                       137                 02/15/2023                K  3.4 (L)             02/15/2023                CO2                      24                  02/15/2023                GLUCOSE                  144 (H)             02/15/2023                BUN                      9                   02/15/2023                CREATININE               0.67                02/15/2023                CALCIUM                  8.8 (L)             02/15/2023                GFR                      79.13                04/04/2022                EGFR                     76                  05/27/2021                GFRNONAA                 >60                 02/15/2023              Anesthesia Other Findings   Reproductive/Obstetrics                             Anesthesia Physical Anesthesia Plan  ASA: 2  Anesthesia Plan: MAC   Post-op Pain Management:    Induction:   PONV Risk Score and Plan: 2 and Treatment may vary due to age or medical condition  Airway Management Planned: Simple Face Mask and Nasal Cannula  Additional Equipment: None  Intra-op Plan:   Post-operative Plan:   Informed Consent: I have reviewed the patients History and Physical, chart, labs and discussed the procedure including the risks, benefits and alternatives for the proposed anesthesia with the patient or authorized representative who has indicated his/her understanding and acceptance.     Dental advisory given  Plan Discussed with: CRNA  Anesthesia Plan Comments:  Anesthesia Quick Evaluation  

## 2023-02-16 NOTE — Transfer of Care (Signed)
Immediate Anesthesia Transfer of Care Note  Patient: Sabrina Aguirre  Procedure(s) Performed: COLONOSCOPY WITH PROPOFOL POLYPECTOMY  Patient Location: PACU  Anesthesia Type:MAC  Level of Consciousness: awake and alert   Airway & Oxygen Therapy: Patient Spontanous Breathing and Patient connected to face mask oxygen  Post-op Assessment: Report given to RN and Post -op Vital signs reviewed and stable  Post vital signs: Reviewed and stable  Last Vitals:  Vitals Value Taken Time  BP 123/80 02/16/23 0815  Temp    Pulse 94 02/16/23 0815  Resp 19 02/16/23 0818  SpO2 100 % 02/16/23 0815  Vitals shown include unvalidated device data.  Last Pain:  Vitals:   02/16/23 0654  TempSrc: Temporal  PainSc: 0-No pain         Complications: No notable events documented.

## 2023-02-16 NOTE — Progress Notes (Signed)
Notified on call provider and made night shift RN aware that patient's daughter was concerned about the edema in the patient's feet. RN's assessment of the bilateral lower extremities this morning were non-pitting, but now has 1+ edema. Patients had been sitting up in the chair this afternoon with her feet down some. Patient currently back in bed with feet elevated and receiving continuous fluids of 0.9% NaCl at 75 mL/hr. Reached out to on call provider to see if continuous fluids should be decreased. Awaiting response from on call provider at this time.

## 2023-02-16 NOTE — Op Note (Signed)
[facility] Patient Name: Sabrina Aguirre Procedure Date: 02/16/2023 MRN: [id number] Attending MD: Jeani Hawking , MD, [id number] Date of Birth: April 15, 1941 CSN: [id number] Age: 82 Admit Type: Inpatient Procedure:                Colonoscopy Indications:              Hematochezia Providers:                Jeani Hawking, MD, Marge Duncans, RN, Adin Hector,                            RN, Kandice Robinsons, Technician Referring MD:              Medicines:                Propofol per Anesthesia Complications:            No immediate complications. Estimated Blood Loss:     Estimated blood loss: none. Procedure:                Pre-Anesthesia Assessment:                           - Prior to the procedure, a History and Physical                            was performed, and patient medications and                            allergies were reviewed. The patient's tolerance of                            previous anesthesia was also reviewed. The risks                            and benefits of the procedure and the sedation                            options and risks were discussed with the patient.                            All questions were answered, and informed consent                            was obtained. Prior Anticoagulants: The patient has                            taken no anticoagulant or antiplatelet agents. ASA                            Grade Assessment: II - A patient with mild systemic                            disease. After reviewing the risks and benefits,  the patient was deemed in satisfactory condition to                            undergo the procedure.                           - Sedation was administered by an anesthesia                            professional. Deep sedation was attained.                           After obtaining informed consent, the colonoscope                            was passed under direct vision.  Throughout the                            procedure, the patient's blood pressure, pulse, and                            oxygen saturations were monitored continuously. The                            CF-HQ190L ([id number]) Olympus colonoscope was                            introduced through the anus and advanced to the the                            cecum, identified by appendiceal orifice and                            ileocecal valve. The colonoscopy was somewhat                            difficult due to significant looping. Successful                            completion of the procedure was aided by using                            manual pressure and straightening and shortening                            the scope to obtain bowel loop reduction. The                            patient tolerated the procedure well. The quality                            of the bowel preparation was evaluated using the  BBPS [facility] Bowel Preparation Scale) with scores                            of: Right Colon = 2 (minor amount of residual                            staining, small fragments of stool and/or opaque                            liquid, but mucosa seen well), Transverse Colon = 3                            (entire mucosa seen well with no residual staining,                            small fragments of stool or opaque liquid) and Left                            Colon = 3 (entire mucosa seen well with no residual                            staining, small fragments of stool or opaque                            liquid). The total BBPS score equals 8. The quality                            of the bowel preparation was good. The ileocecal                            valve, appendiceal orifice, and rectum were                            photographed. Scope In: 7:42:11 AM Scope Out: 8:05:18 AM Scope Withdrawal Time: 0 hours 18 minutes 4 seconds  Total Procedure  Duration: 0 hours 23 minutes 7 seconds  Findings:      A 1 mm polyp was found in the cecum. The polyp was sessile. The polyp       was removed with a cold snare. Resection and retrieval were complete.      Scattered medium-mouthed and small-mouthed diverticula were found in the       sigmoid colon.      There was no evidence of bleeding. The presumption is that she had a       diverticular bleed. Impression:               - One 1 mm polyp in the cecum, removed with a cold                            snare. Resected and retrieved.                           - Diverticulosis in the sigmoid colon. Moderate Sedation:      Not  Applicable - Patient had care per Anesthesia. Recommendation:           - Patient has a contact number available for                            emergencies. The signs and symptoms of potential                            delayed complications were discussed with the                            patient. Return to normal activities tomorrow.                            Written discharge instructions were provided to the                            patient.                           - Resume previous diet.                           - Continue present medications.                           - Await pathology results.                           - Repeat colonoscopy is not recommended for                            surveillance.                           - Check HGB today.                           - Okay for D/C home and follow up with Dr. Loreta Ave in                            2-4 weeks. Procedure Code(s):        --- Professional ---                           [phone], Colonoscopy, flexible; with removal of                            tumor(s), polyp(s), or other lesion(s) by snare                            technique Diagnosis Code(s):        --- Professional ---                           D12.0, Benign neoplasm of cecum  K92.1, Melena (includes Hematochezia)                            K57.30, Diverticulosis of large intestine without                            perforation or abscess without bleeding CPT copyright 2022 American Medical Association. All rights reserved. The codes documented in this report are preliminary and upon coder review may  be revised to meet current compliance requirements. Jeani Hawking, MD Jeani Hawking, MD 02/16/2023 8:16:09 AM This report has been signed electronically. Number of Addenda: 0

## 2023-02-16 NOTE — Evaluation (Signed)
Physical Therapy Evaluation Patient Details Name: Sabrina Aguirre MRN: [id number] DOB: [birth year] Today's Date: 02/16/2023  History of Present Illness  82 yo with BRBPR, hypokalemia, hyponatremia. PMH: DM, HTN, dizziness (MRI 6/3) (-) for CVA.  Clinical Impression  Pt admitted with above diagnosis.  Pt currently with functional limitations due to the deficits listed below (see PT Problem List). Pt will benefit from acute skilled PT to increase their independence and safety with mobility to allow discharge.  Performed oculomotor examination including gaze, smooth pursuits, saccades, head shaking, VOR and no nystagmus or abnormal findings observed.  Pt reports spinning and dizziness started about a week ago about the time of her "sinus infection" however pt has been taking antibiotics and dizziness has greatly improved since that time.  Pt possibly with hypofunction at that time.  Pt also has been less mobile.  Pt eager to ambulate and performed 40 feet but was limited by dizziness (see mobility section below).  Pt was provided with outpatient vestibular PT referral form in case her symptoms return however currently does not appear to need at this time.  Pt eager to d/c home today and reports her children with provide 24/7 care until she feels better.  Sitting: BP 130/87 mmHg, HR 115 bpm Standing: BP 124/83 mmHg, HR128 bpm Standing after 3 minutes: BP 139/92 mmHg, HR 126 bpm        Recommendations for follow up therapy are one component of a multi-disciplinary discharge planning process, led by the attending physician.  Recommendations may be updated based on patient status, additional functional criteria and insurance authorization.  Follow Up Recommendations       Assistance Recommended at Discharge Intermittent Supervision/Assistance  Patient can return home with the following  A little help with walking and/or transfers;A little help with bathing/dressing/bathroom;Assistance with  cooking/housework;Assist for transportation;Help with stairs or ramp for entrance    Equipment Recommendations None recommended by PT  Recommendations for Other Services       Functional Status Assessment Patient has had a recent decline in their functional status and demonstrates the ability to make significant improvements in function in a reasonable and predictable amount of time.     Precautions / Restrictions Precautions Precautions: Fall      Mobility  Bed Mobility               General bed mobility comments: pt in recliner (denies dizziness with bed mobility)    Transfers Overall transfer level: Needs assistance Equipment used: Rolling walker (2 wheels) Transfers: Sit to/from Stand Sit to Stand: Min guard           General transfer comment: cues for hand placement to self assist, dizzy upon standing    Ambulation/Gait Ambulation/Gait assistance: Min assist, Min guard Gait Distance (Feet): 40 Feet Assistive device: Rolling walker (2 wheels) Gait Pattern/deviations: Step-through pattern, Decreased stride length Gait velocity: decr     General Gait Details: slow pace, dizziness increased so obtained BP during ambulation: 147/89 mmHg, HR133 bpm, pt able to return to room; attempted to ask pt to describe dizziness however she does not use vertigo descriptions only "weak, dizzy" " well I haven't really eaten"  Stairs            Wheelchair Mobility    Modified Rankin (Stroke Patients Only)       Balance Overall balance assessment: Needs assistance         Standing balance support: Bilateral upper extremity supported, Reliant on assistive device for balance Standing  balance-Leahy Scale: Poor                               Pertinent Vitals/Pain Pain Assessment Pain Assessment: No/denies pain    Home Living Family/patient expects to be discharged to:: Private residence Living Arrangements: Alone Available Help at Discharge:  Family;Available 24 hours/day Type of Home: House Home Access: Level entry       Home Layout: One level Home Equipment: Agricultural consultant (2 wheels);Cane - single point;Shower seat;Grab bars - tub/shower;Grab bars - toilet      Prior Function Prior Level of Function : Independent/Modified Independent (more difficulty for several weeks prior to admission, depending on daughter for mobility)               ADLs Comments: family assists with errands     Hand Dominance        Extremity/Trunk Assessment        Lower Extremity Assessment Lower Extremity Assessment: Generalized weakness       Communication   Communication: No difficulties  Cognition Arousal/Alertness: Awake/alert Behavior During Therapy: WFL for tasks assessed/performed Overall Cognitive Status: Within Functional Limits for tasks assessed                                          General Comments      Exercises     Assessment/Plan    PT Assessment Patient needs continued PT services  PT Problem List Decreased strength;Decreased activity tolerance;Decreased mobility;Decreased knowledge of use of DME;Decreased balance;Cardiopulmonary status limiting activity       PT Treatment Interventions DME instruction;Balance training;Therapeutic exercise;Gait training;Functional mobility training;Therapeutic activities;Patient/family education    PT Goals (Current goals can be found in the Care Plan section)  Acute Rehab PT Goals PT Goal Formulation: With patient Time For Goal Achievement: 03/02/23 Potential to Achieve Goals: Good    Frequency Min 1X/week     Co-evaluation               AM-PAC PT "6 Clicks" Mobility  Outcome Measure Help needed turning from your back to your side while in a flat bed without using bedrails?: A Little Help needed moving from lying on your back to sitting on the side of a flat bed without using bedrails?: A Little Help needed moving to and from a  bed to a chair (including a wheelchair)?: A Little Help needed standing up from a chair using your arms (e.g., wheelchair or bedside chair)?: A Little Help needed to walk in hospital room?: A Little Help needed climbing 3-5 steps with a railing? : A Lot 6 Click Score: 17    End of Session Equipment Utilized During Treatment: Gait belt Activity Tolerance: Patient tolerated treatment well Patient left: in chair;with call bell/phone within reach;with chair alarm set Nurse Communication: Mobility status PT Visit Diagnosis: Difficulty in walking, not elsewhere classified (R26.2)    Time: 1610-9604 PT Time Calculation (min) (ACUTE ONLY): 29 min   Charges:   PT Evaluation $PT Eval Low Complexity: 1 Low PT Treatments $Gait Training: 8-22 mins       Paulino Door, DPT Physical Therapist Acute Rehabilitation Services Office: [phone]   Sabrina Aguirre 02/16/2023, 4:38 PM

## 2023-02-16 NOTE — Progress Notes (Signed)
Transition of Care [facility]) - Inpatient Brief Assessment   Patient Details  Name: Sabrina Aguirre MRN: [id number] Date of Birth: 06/11/1941  Transition of Care [facility]) CM/SW Contact:    Larrie Kass, LCSW Phone Number: 02/16/2023, 12:09 PM   Clinical Narrative: Transition of Care Department [facility]) has reviewed patient and no TOC needs have been identified at this time. We will continue to monitor patient advancement through interdisciplinary progression rounds. If new patient transition needs arise, please place a TOC consult.   Transition of Care Asessment: Insurance and Status: Insurance coverage has been reviewed Patient has primary care physician: Yes Home environment has been reviewed: yes   Prior/Current Home Services: No current home services Social Determinants of Health Reivew: SDOH reviewed no interventions necessary Readmission risk has been reviewed: Yes Transition of care needs: no transition of care needs at this time

## 2023-02-16 NOTE — Progress Notes (Signed)
   02/16/23 1448  Assess: MEWS Score  Temp 98.9 F (37.2 C)  BP 120/68  MAP (mmHg) 83  Pulse Rate (!) 116  Resp 18  Level of Consciousness Alert  SpO2 98 %  O2 Device Room Air  Patient Activity (if Appropriate) In chair  Assess: MEWS Score  MEWS Temp 0  MEWS Systolic 0  MEWS Pulse 2  MEWS RR 0  MEWS LOC 0  MEWS Score 2  MEWS Score Color Yellow  Assess: if the MEWS score is Yellow or Red  Were vital signs taken at a resting state? Yes  Focused Assessment No change from prior assessment  Does the patient meet 2 or more of the SIRS criteria? No  MEWS guidelines implemented  Yes, yellow  Treat  MEWS Interventions Considered administering scheduled or prn medications/treatments as ordered  Take Vital Signs  Increase Vital Sign Frequency  Yellow: Q2hr x1, continue Q4hrs until patient remains green for 12hrs  Escalate  MEWS: Escalate Yellow: Discuss with charge nurse and consider notifying provider and/or RRT  Notify: Charge Nurse/RN  Name of Charge Nurse/RN Notified Dagoberto Ligas, RN  Provider Notification  Provider Name/Title Hartley Barefoot, MD  Date Provider Notified 02/16/23  Time Provider Notified 1454  Method of Notification Page  Notification Reason Other (Comment) (elevated HR)  Provider response See new orders  Date of Provider Response 02/16/23  Time of Provider Response 1515  Assess: SIRS CRITERIA  SIRS Temperature  0  SIRS Pulse 1  SIRS Respirations  0  SIRS WBC 0  SIRS Score Sum  1

## 2023-02-16 NOTE — Interval H&P Note (Signed)
History and Physical Interval Note:  02/16/2023 7:24 AM  Sabrina Aguirre  has presented today for surgery, with the diagnosis of Hematochezia.  The various methods of treatment have been discussed with the patient and family. After consideration of risks, benefits and other options for treatment, the patient has consented to  Procedure(s): COLONOSCOPY WITH PROPOFOL (N/A) as a surgical intervention.  The patient's history has been reviewed, patient examined, no change in status, stable for surgery.  I have reviewed the patient's chart and labs.  Questions were answered to the patient's satisfaction.     Daylah Sayavong D

## 2023-02-16 NOTE — Inpatient Diabetes Management (Signed)
Inpatient Diabetes Program Recommendations  AACE/ADA: New Consensus Statement on Inpatient Glycemic Control (2015)  Target Ranges:  Prepandial:   less than 140 mg/dL      Peak postprandial:   less than 180 mg/dL (1-2 hours)      Critically ill patients:  140 - 180 mg/dL   Lab Results  Component Value Date   GLUCAP 202 (H) 02/16/2023   HGBA1C >15.5 (H) 02/14/2023    Review of Glycemic Control  Latest Reference Range & Units 02/15/23 11:14 02/15/23 17:24 02/15/23 20:59 02/16/23 07:02 02/16/23 12:38  Glucose-Capillary 70 - 99 mg/dL 742 (H) 595 (H) 638 (H) 150 (H) 202 (H)  Diabetes history: DM 2 Outpatient Diabetes medications:  Metformin 850 mg tid Current orders for Inpatient glycemic control:  Novolog 0-9 units tid with meals and HS Levemir 15 units daily Novolog 3 units tid with meals  Inpatient Diabetes Program Recommendations:    Note plans for discharge today.  Attempted to start and set-up Freestyle Libre 3 on patient's phone, however it is not compatible with the app.  Daughters phone will work but she does not live with the patient and therefore this would not work.  Sent message to MD to please order Freestyle Libre 3 reader for patient and gave patient/daughter 2 samples to start at home once they receive reader.  Reviewed how to set-up and start as well as glucose values, hypoglycemia treatment and when to call MD.  Printed and reviewed DM basics with patient and daughter highlighting symptoms of low blood sugars and treatment. We also reviewed insulins including Lantus-long acting once daily and Novolog 3 units tid with meals.  Gave them extra handout on how to use insulin pen as well (which was reviewed with them earlier in the week).  Teach back done and encouraged patient to f/u with PCP asap for medication titration.  Also discussed that if CGM does not work or if they are unable to get set-up, she will need to do fingersticks 4 times a day prior to meals and at bedtime.   Goal blood sugars are 100-180 mg/dL. Both patient and daughter verbalized understanding and appreciative of information.  Discharge Recommendations: Other recommendations: Freestyle Josephine Igo [phone] Long acting recommendations: Insulin Glargine (LANTUS) Solostar Pen 15 units daily  Short acting recommendations:  Correction coverage ONLY Insulin aspart (NOVOLOG) FlexPen  Sensitive Scale.   Supply/Referral recommendations: Pen needles - standard  Thanks,  Beryl Meager, RN, BC-ADM Inpatient Diabetes Coordinator Pager [phone]  (8a-5p)

## 2023-02-16 NOTE — Evaluation (Signed)
Occupational Therapy Evaluation Patient Details Name: Sabrina Aguirre MRN: [id number] DOB: 08-18-1941 Today's Date: 02/16/2023   History of Present Illness 82 yo with BRBPR, hypokalemia, hyponatremia. PMH: DM, HTN, dizziness (MRI 6/3) (-) for CVA.   Clinical Impression   PTA pt lives alone independently with her children assisting as needed for IADL tasks. Pt states she has required more assistance for the last few weeks due to generalized weakness. Able to mobilize and complete ADL tasks @ RW level with min A. Recommend follow up with HHOT and direct S with all mobility and ADL @ RW level. Family issued gait belt. Educated on strategies to reduce risk of falls. Acute OT will follow.      Recommendations for follow up therapy are one component of a multi-disciplinary discharge planning process, led by the attending physician.  Recommendations may be updated based on patient status, additional functional criteria and insurance authorization.   Assistance Recommended at Discharge Frequent or constant Supervision/Assistance  Patient can return home with the following A little help with walking and/or transfers;A little help with bathing/dressing/bathroom;Assistance with cooking/housework;Assist for transportation    Functional Status Assessment  Patient has had a recent decline in their functional status and demonstrates the ability to make significant improvements in function in a reasonable and predictable amount of time.  Equipment Recommendations  None recommended by OT    Recommendations for Other Services PT consult     Precautions / Restrictions Precautions Precautions: Fall      Mobility Bed Mobility Overal bed mobility: Needs Assistance Bed Mobility: Supine to Sit     Supine to sit: Supervision          Transfers Overall transfer level: Needs assistance Equipment used: Rolling walker (2 wheels) Transfers: Sit to/from Stand Sit to Stand: Min guard                   Balance Overall balance assessment: Needs assistance   Sitting balance-Leahy Scale: Good       Standing balance-Leahy Scale: Poor                             ADL either performed or assessed with clinical judgement   ADL Overall ADL's : Needs assistance/impaired     Grooming: Min guard;Standing   Upper Body Bathing: Set up;Sitting   Lower Body Bathing: Min guard;Sit to/from stand   Upper Body Dressing : Set up;Sitting   Lower Body Dressing: Minimal assistance;Sit to/from stand   Toilet Transfer: Minimal assistance;Ambulation;Rolling walker (2 wheels);Grab bars   Toileting- Clothing Manipulation and Hygiene: Min guard;Sit to/from stand       Functional mobility during ADLs: Minimal assistance;Rolling walker (2 wheels);Cueing for safety       Vision Baseline Vision/History: 1 Wears glasses       Perception     Praxis      Pertinent Vitals/Pain Pain Assessment Pain Assessment: Faces Faces Pain Scale: Hurts little more Pain Location: low back Pain Descriptors / Indicators: Grimacing, Guarding Pain Intervention(s): Limited activity within patient's tolerance     Hand Dominance     Extremity/Trunk Assessment Upper Extremity Assessment Upper Extremity Assessment: Overall WFL for tasks assessed   Lower Extremity Assessment Lower Extremity Assessment: Defer to PT evaluation   Cervical / Trunk Assessment Cervical / Trunk Assessment: Normal   Communication     Cognition Arousal/Alertness: Awake/alert Behavior During Therapy: WFL for tasks assessed/performed Overall Cognitive Status: Within Functional Limits for tasks  assessed                                       General Comments  Daughter given gait belt; educated on fall precautions and home set up to reduce risk of falls; recommend using BSC by bed adn only ambulate with family until her strength improves    Exercises     Shoulder Instructions      Home  Living Family/patient expects to be discharged to:: Private residence Living Arrangements: Alone Available Help at Discharge: Family;Available 24 hours/day Type of Home: House Home Access: Level entry     Home Layout: One level     Bathroom Shower/Tub: Producer, television/film/video: Standard Bathroom Accessibility: Yes How Accessible: Accessible via walker Home Equipment: Rolling Walker (2 wheels);Cane - single point;Shower seat;Grab bars - tub/shower;Grab bars - toilet          Prior Functioning/Environment Prior Level of Function : Independent/Modified Independent (more difficulty for several weeks prior to admission, depending on daughter for mobility)               ADLs Comments: family assists with errands        OT Problem List: Decreased strength;Decreased activity tolerance;Impaired balance (sitting and/or standing);Decreased knowledge of use of DME or AE;Decreased knowledge of precautions      OT Treatment/Interventions: Self-care/ADL training;Therapeutic exercise;Energy conservation;DME and/or AE instruction;Therapeutic activities;Patient/family education;Balance training    OT Goals(Current goals can be found in the care plan section) Acute Rehab OT Goals Patient Stated Goal: to be independent OT Goal Formulation: With patient Time For Goal Achievement: 03/02/23 Potential to Achieve Goals: Good  OT Frequency: Min 1X/week    Co-evaluation              AM-PAC OT "6 Clicks" Daily Activity     Outcome Measure Help from another person eating meals?: None Help from another person taking care of personal grooming?: A Little Help from another person toileting, which includes using toliet, bedpan, or urinal?: A Little Help from another person bathing (including washing, rinsing, drying)?: A Little Help from another person to put on and taking off regular upper body clothing?: A Little Help from another person to put on and taking off regular lower body  clothing?: A Little 6 Click Score: 19   End of Session Equipment Utilized During Treatment: Gait belt;Rolling walker (2 wheels) Nurse Communication: Mobility status;Other (comment) (DC needs)  Activity Tolerance: Patient tolerated treatment well Patient left: in chair;with call bell/phone within reach;with family/visitor present  OT Visit Diagnosis: Unsteadiness on feet (R26.81);Muscle weakness (generalized) (M62.81)                Time: 1244-1310 OT Time Calculation (min): 26 min Charges:  OT General Charges $OT Visit: 1 Visit OT Evaluation $OT Eval Moderate Complexity: 1 Mod OT Treatments $Self Care/Home Management : 8-22 mins  Luisa Dago, OT/L   Acute OT Clinical Specialist Acute Rehabilitation Services Pager [phone] Office [phone]   [facility] 02/16/2023, 1:25 PM

## 2023-02-16 NOTE — Progress Notes (Signed)
Notified MD that RN gave patient the metoprolol and patient's heart rate was 116, which put patient in the yellow MEWS. MD put in a new order for patient to get IVPB magnesium sulfate 2 g 50 mL and for RN to recheck patient's heart rate again later. MD told RN that patient could go home as long as patient's heart rate comes down with the metoprolol and she feels well.   After IVPB magnesium sulfate was finished, RN rechecked patient's vitals and patient's heart rate was 102. RN notified MD of this. MD decided to keep patient overnight and give patient fluids to make sure patient's heart rate is better, since patient had the bowel prep and colonoscopy today.

## 2023-02-16 NOTE — TOC Transition Note (Signed)
Transition of Care [facility]) - CM/SW Discharge Note   Patient Details  Name: Sabrina Aguirre MRN: [id number] Date of Birth: Jul 13, 1941  Transition of Care [facility]) CM/SW Contact:  Larrie Kass, LCSW Phone Number: 02/16/2023, 3:32 PM   Clinical Narrative:    CSW spoke with pt son, Feliz Beam about Home Health rec for PT and OT , he reports he think it will benefit his mother , however to speak with his sister to inform her about rec. CSW attempted to call [name], no answer unable to leave VM.   CSW spoke with pt she reports no preference in DME company. [facility] accepted pt for HHPT/OT . No further TOC needs TOC sign off.    Final next level of care: Home w Home Health Services Barriers to Discharge: No Barriers Identified   Patient Goals and CMS Choice CMS Medicare.gov Compare Post Acute Care list provided to:: Patient Represenative (must comment) Choice offered to / list presented to : Patient, Adult Children  Discharge Placement                      Patient and family notified of of transfer: 02/16/23  Discharge Plan and Services Additional resources added to the After Visit Summary for                                       Social Determinants of Health (SDOH) Interventions SDOH Screenings   Food Insecurity: No Food Insecurity (02/14/2023)  Housing: Low Risk  (02/14/2023)  Transportation Needs: No Transportation Needs (02/14/2023)  Utilities: Not At Risk (02/14/2023)  Depression (PHQ2-9): Low Risk  (01/16/2023)  Recent Concern: Depression (PHQ2-9) - Medium Risk (10/18/2022)  Financial Resource Strain: Low Risk  (01/16/2023)  Physical Activity: Insufficiently Active (01/16/2023)  Social Connections: Moderately Isolated (01/16/2023)  Stress: No Stress Concern Present (01/16/2023)  Tobacco Use: Medium Risk (02/14/2023)     Readmission Risk Interventions     No data to display

## 2023-02-16 NOTE — Progress Notes (Addendum)
Bowel prep completed, pt NPO since midnight. Pt off of unit for procedure.

## 2023-02-16 NOTE — Progress Notes (Signed)
PROGRESS NOTE    Sabrina Aguirre  ZOX:[id number] DOB: Jun 20, 1941 DOA: 02/13/2023 PCP: Lula Olszewski, MD   Brief Narrative: 82 year old with past medical history significant for diabetes type 2, hypertension presents to the ED with complaint of rectal bleeding ongoing for some time, patient reported bright red blood per rectum.  She also reports loose stool. She has had some cough. She has recently diagnose with ear infection, her PC was having concern with mastoiditis, and prescribe another 7 days of antibiotics on June 20th. She has been evaluated by her PCP for dizziness, she even had MRI brain 6/03: negative for stroke.   CT angio abdomen and pelvis done 6/25: No acute intra-abdominal or pelvic pathology. No evidence of active GI bleed.   Assessment & Plan:   Principal Problem:   BRBPR (bright red blood per rectum) Active Problems:   Uncontrolled type 2 diabetes mellitus with hyperglycemia, without long-term current use of insulin (HCC)   High anion gap metabolic acidosis   Hypokalemia   1-DKA; Diabetes type 2 Patient presents with CBG 500, gap of 25.  Bicarb of 16, beta butyrate acid 8 She was treated with insulin drip and IV fluids Anion gap is closed, down to 9, bicarb 22, blood glucose 136 Continue with Levemir 15 units daily. SSI. 3 units novolog meals coverage.  A1c. More than 15.  Previously taking metformin.  She will need to be discharge on Insulin.  CBG better controlled.   2-Hypokalemia; Replaced.   3-Hyponatremia, and pseudohyponatremia in setting hyperglycemia dn dehydration.  Improved with IV fluids and correction. Resolved.   4-Dizziness; She has had MRI brain done on 6/3 for same negative for stroke.  Suspect related to hyperglycemia, dehydration, multiples electrolytes abnormalities, recent ear infection.  -PT for vestibular evaluation.  -She had recent tx for ear infection.  -ECHO. Normal EF, no significant valve abnormalities.  -Improved.  Orthostatic vitals today negative.  -B 12 not low.  Will give few more hours of fluids.   5-GI bleed; BRB per rectum; likely diverticular Bleed.  Continue to monitor hb. Down to 11 form 13 GI has been consulted. Underwent colonoscopy; Diverticulosis.  CTA negative for bleeding.  Continue to monitor hb.   6-Hallucination, likely in setting delirium, hyperglycemia. Resolved.  She reporter seen red truck in her room.   Hypotension;  Suspect related to dehydration in setting of hyperglycemia.  Improved with IV fluids.  Hold Home BP medications.  BP improved.  Tachycardia; sinus. Resume lower home dose metoprolol. BP increasing.  Plan to keep overnight. And will give few Hours of IV fluids. She had prep for colonoscopy  Recent ear infection.  She has left 2 more days of antibiotics. Wont resume antibiotics. She has now completed 15 days antibiotics.   Diarrhea; resolved. No further BM   See wound documentation below.  Pressure Injury 02/13/23 Rectum Medial;Upper Stage 2 -  Partial thickness loss of dermis presenting as a shallow open injury with a red, pink wound bed without slough. (Active)  02/13/23 2200  Location: Rectum  Location Orientation: Medial;Upper  Staging: Stage 2 -  Partial thickness loss of dermis presenting as a shallow open injury with a red, pink wound bed without slough.  Wound Description (Comments):   Present on Admission: Yes  Dressing Type Foam - Lift dressing to assess site every shift 02/15/23 2055     Estimated body mass index is 26.04 kg/m as calculated from the following:   Height as of this encounter: 5\' 4"  (1.626  m).   Weight as of this encounter: 68.8 kg.   DVT prophylaxis: SCD Code Status: Full code Family Communication: Daughter at bedside.6/28 Disposition Plan:  Status is: Observation The patient remains OBS appropriate and will d/c before 2 midnights.    Consultants:  GI, Dr Man.   Procedures:    Antimicrobials:     Subjective: She is feeling better. No further bleeding.  She relates dizziness is better. She feels weak.   Objective: Vitals:   02/16/23 0902 02/16/23 1242 02/16/23 1448 02/16/23 1714  BP: 132/82 (!) 133/92 120/68 118/77  Pulse: 97 (!) 101 (!) 116 (!) 102  Resp: 16 20 18 19   Temp: 98.2 F (36.8 C) 98.6 F (37 C) 98.9 F (37.2 C) 98.7 F (37.1 C)  TempSrc: Oral Oral Oral Oral  SpO2: 98% 99% 98% 99%  Weight:      Height:        Intake/Output Summary (Last 24 hours) at 02/16/2023 1731 Last data filed at 02/16/2023 1000 Gross per 24 hour  Intake 1557.71 ml  Output 200 ml  Net 1357.71 ml    Filed Weights   02/13/23 2200  Weight: 68.8 kg    Examination:  General exam: NAD Respiratory system: CTA Cardiovascular system: S 1, S 2 RRR Gastrointestinal system: BS present, soft, nt Central nervous system: alert, non focal.  Extremities: no edema    Data Reviewed: I have personally reviewed following labs and imaging studies  CBC: Recent Labs  Lab 02/13/23 1710 02/13/23 2016 02/14/23 0054 02/14/23 0311 02/14/23 0557 02/14/23 1447 02/14/23 2307 02/15/23 0302  WBC 11.5* 9.3  --   --   --   --   --  8.2  NEUTROABS  --  8.2*  --   --   --   --   --   --   HGB 13.5 12.6   < > 11.8* 11.8* 11.5* 11.9* 11.5*  HCT 40.2 36.8   < > 35.1* 34.4* 34.0* 35.0* 34.0*  MCV 87.0 86.4  --   --   --   --   --  88.1  PLT 213 180  --   --   --   --   --  142*   < > = values in this interval not displayed.    Basic Metabolic Panel: Recent Labs  Lab 02/14/23 0054 02/14/23 0311 02/14/23 0557 02/14/23 1224 02/15/23 0302 02/16/23 1214  NA 130* 132* 133* 133* 137 138  K 2.8* 2.7* 2.9* 4.1 3.4* 3.7  CL 92* 96* 98 102 107 108  CO2 21* 21* 25 22 24 22   GLUCOSE 135* 177* 150* 136* 144* 217*  BUN 12 12 10 9 9  <5*  CREATININE 0.82 0.86 0.75 0.67 0.67 0.60  CALCIUM 9.1 8.9 9.1 8.9 8.8* 8.5*  MG 1.5*  --   --   --   --   --     GFR: Estimated Creatinine Clearance: 50.7  mL/min (by C-G formula based on SCr of 0.6 mg/dL). Liver Function Tests: Recent Labs  Lab 02/13/23 1710  AST 21  ALT 20  ALKPHOS 48  BILITOT 2.2*  PROT 7.3  ALBUMIN 3.9    No results for input(s): "LIPASE", "AMYLASE" in the last 168 hours. No results for input(s): "AMMONIA" in the last 168 hours. Coagulation Profile: No results for input(s): "INR", "PROTIME" in the last 168 hours. Cardiac Enzymes: No results for input(s): "CKTOTAL", "CKMB", "CKMBINDEX", "TROPONINI" in the last 168 hours. BNP (last 3 results) No results  for input(s): "PROBNP" in the last 8760 hours. HbA1C: Recent Labs    02/14/23 1224  HGBA1C >15.5*   CBG: Recent Labs  Lab 02/15/23 1724 02/15/23 2059 02/16/23 0702 02/16/23 1238 02/16/23 1634  GLUCAP 182* 199* 150* 202* 141*    Lipid Profile: No results for input(s): "CHOL", "HDL", "LDLCALC", "TRIG", "CHOLHDL", "LDLDIRECT" in the last 72 hours. Thyroid Function Tests: No results for input(s): "TSH", "T4TOTAL", "FREET4", "T3FREE", "THYROIDAB" in the last 72 hours. Anemia Panel: Recent Labs    02/15/23 1142  VITAMINB12 1,976*   Sepsis Labs: Recent Labs  Lab 02/13/23 2016 02/14/23 0054  LATICACIDVEN 1.6 2.0*     No results found for this or any previous visit (from the past 240 hour(s)).       Radiology Studies: ECHOCARDIOGRAM COMPLETE  Result Date: 02/15/2023    ECHOCARDIOGRAM REPORT   Patient Name:   LIZBEHT FRATTO Date of Exam: 02/15/2023 Medical Rec #:  [id number]       Height:       64.0 in Accession #:    [id number]      Weight:       151.7 lb Date of Birth:  07/29/1941        BSA:          1.739 m Patient Age:    82 years        BP:           123/72 mmHg Patient Gender: F               HR:           92 bpm. Exam Location:  Inpatient Procedure: 2D Echo, Cardiac Doppler and Color Doppler Indications:    Abnormal EKG  History:        Patient has prior history of Echocardiogram examinations, most                 recent 07/22/2017.  Arrythmias:atrial tachycardia; Risk                 Factors:Diabetes, Hypertension and Former Smoker.  Sonographer:    Wallie Char Referring Phys: [phone] Zulma Court A Jeran Hiltz  Sonographer Comments: Technically difficult study due to poor echo windows. IMPRESSIONS  1. Left ventricular ejection fraction, by estimation, is 60 to 65%. The left ventricle has normal function. The left ventricle has no regional wall motion abnormalities. There is mild concentric left ventricular hypertrophy. Left ventricular diastolic parameters are consistent with Grade I diastolic dysfunction (impaired relaxation).  2. Right ventricular systolic function is normal. The right ventricular size is normal.  3. Left atrial size was mildly dilated.  4. The mitral valve is grossly normal. Trivial mitral valve regurgitation.  5. The aortic valve is tricuspid. There is mild calcification of the aortic valve. There is mild thickening of the aortic valve. Aortic valve regurgitation is trivial. Aortic valve sclerosis/calcification is present, without any evidence of aortic stenosis.  6. Aortic dilatation noted. There is borderline dilatation of the ascending aorta, measuring 39 mm. Comparison(s): No prior Echocardiogram. FINDINGS  Left Ventricle: Left ventricular ejection fraction, by estimation, is 60 to 65%. The left ventricle has normal function. The left ventricle has no regional wall motion abnormalities. The left ventricular internal cavity size was normal in size. There is  mild concentric left ventricular hypertrophy. Left ventricular diastolic parameters are consistent with Grade I diastolic dysfunction (impaired relaxation). Right Ventricle: The right ventricular size is normal. No increase in right ventricular wall thickness. Right ventricular systolic  function is normal. Left Atrium: Left atrial size was mildly dilated. Right Atrium: Right atrial size was normal in size. Pericardium: There is no evidence of pericardial effusion. Mitral  Valve: The mitral valve is grossly normal. There is mild thickening of the mitral valve leaflet(s). There is mild calcification of the mitral valve leaflet(s). Trivial mitral valve regurgitation. MV peak gradient, 3.6 mmHg. The mean mitral valve gradient is 2.0 mmHg. Tricuspid Valve: The tricuspid valve is normal in structure. Tricuspid valve regurgitation is trivial. Aortic Valve: The aortic valve is tricuspid. There is mild calcification of the aortic valve. There is mild thickening of the aortic valve. Aortic valve regurgitation is trivial. Aortic regurgitation PHT measures 376 msec. Aortic valve sclerosis/calcification is present, without any evidence of aortic stenosis. Aortic valve mean gradient measures 5.0 mmHg. Aortic valve peak gradient measures 8.9 mmHg. Aortic valve area, by VTI measures 2.35 cm. Pulmonic Valve: The pulmonic valve was normal in structure. Pulmonic valve regurgitation is trivial. Aorta: Aortic dilatation noted. There is borderline dilatation of the ascending aorta, measuring 39 mm. IAS/Shunts: The atrial septum is grossly normal.  LEFT VENTRICLE PLAX 2D LVIDd:         3.60 cm     Diastology LVIDs:         2.40 cm     LV e' medial:    5.34 cm/s LV PW:         1.10 cm     LV E/e' medial:  16.0 LV IVS:        1.10 cm     LV e' lateral:   8.20 cm/s LVOT diam:     1.90 cm     LV E/e' lateral: 10.4 LV SV:         66 LV SV Index:   38 LVOT Area:     2.84 cm  LV Volumes (MOD) LV vol d, MOD A2C: 43.6 ml LV vol d, MOD A4C: 44.0 ml LV vol s, MOD A2C: 14.9 ml LV vol s, MOD A4C: 15.0 ml LV SV MOD A2C:     28.7 ml LV SV MOD A4C:     44.0 ml LV SV MOD BP:      28.8 ml RIGHT VENTRICLE             IVC RV S prime:     19.00 cm/s  IVC diam: 1.30 cm TAPSE (M-mode): 1.8 cm LEFT ATRIUM             Index        RIGHT ATRIUM           Index LA diam:        3.60 cm 2.07 cm/m   RA Area:     14.50 cm LA Vol (A2C):   60.1 ml 34.55 ml/m  RA Volume:   31.20 ml  17.94 ml/m LA Vol (A4C):   53.2 ml 30.58 ml/m LA  Biplane Vol: 59.5 ml 34.21 ml/m  AORTIC VALVE AV Area (Vmax):    2.25 cm AV Area (Vmean):   2.16 cm AV Area (VTI):     2.35 cm AV Vmax:           149.50 cm/s AV Vmean:          105.050 cm/s AV VTI:            0.279 m AV Peak Grad:      8.9 mmHg AV Mean Grad:      5.0 mmHg LVOT Vmax:  118.50 cm/s LVOT Vmean:        80.200 cm/s LVOT VTI:          0.232 m LVOT/AV VTI ratio: 0.83 AI PHT:            376 msec  AORTA Ao Root diam: 3.30 cm Ao Asc diam:  3.90 cm MITRAL VALVE MV Area (PHT): 3.17 cm     SHUNTS MV Area VTI:   2.95 cm     Systemic VTI:  0.23 m MV Peak grad:  3.6 mmHg     Systemic Diam: 1.90 cm MV Mean grad:  2.0 mmHg MV Vmax:       0.95 m/s MV Vmean:      70.4 cm/s MV Decel Time: 239 msec MV E velocity: 85.30 cm/s MV A velocity: 100.00 cm/s MV E/A ratio:  0.85 Laurance Flatten MD Electronically signed by Laurance Flatten MD Signature Date/Time: 02/15/2023/2:25:25 PM    Final         Scheduled Meds:  Chlorhexidine Gluconate Cloth  6 each Topical Daily   cholecalciferol  1,000 Units Oral Daily   insulin aspart  0-5 Units Subcutaneous QHS   insulin aspart  0-9 Units Subcutaneous TID WC   insulin aspart  3 Units Subcutaneous TID WC   insulin detemir  15 Units Subcutaneous Daily   insulin starter kit- pen needles  1 kit Other Once   latanoprost  1 drop Both Eyes QHS   living well with diabetes book   Does not apply Once   magnesium oxide  400 mg Oral BID   metoprolol succinate  50 mg Oral Daily   montelukast  10 mg Oral QHS   omega-3 acid ethyl esters  1 g Oral Daily   pantoprazole  40 mg Oral Daily   Continuous Infusions:  sodium chloride       LOS: 1 day    Time spent: 35 minutes    Jabori Henegar A Arali Somera, MD [facility]   If 7PM-7AM, please contact night-coverage [email or web address]  02/16/2023, 5:31 PM

## 2023-02-16 NOTE — Anesthesia Postprocedure Evaluation (Signed)
Anesthesia Post Note  Patient: Sabrina Aguirre  Procedure(s) Performed: COLONOSCOPY WITH PROPOFOL POLYPECTOMY     Patient location during evaluation: PACU Anesthesia Type: MAC Level of consciousness: awake and alert Pain management: pain level controlled Vital Signs Assessment: post-procedure vital signs reviewed and stable Respiratory status: spontaneous breathing, nonlabored ventilation, respiratory function stable and patient connected to nasal cannula oxygen Cardiovascular status: stable and blood pressure returned to baseline Postop Assessment: no apparent nausea or vomiting Anesthetic complications: no   No notable events documented.  Last Vitals:  Vitals:   02/16/23 0902 02/16/23 1242  BP: 132/82 (!) 133/92  Pulse: 97 (!) 101  Resp: 16 20  Temp: 36.8 C 37 C  SpO2: 98% 99%    Last Pain:  Vitals:   02/16/23 1242  TempSrc: Oral  PainSc:                  Nelle Don Avonna Iribe

## 2023-02-17 DIAGNOSIS — K625 Hemorrhage of anus and rectum: Secondary | ICD-10-CM | POA: Diagnosis not present

## 2023-02-17 LAB — GLUCOSE, CAPILLARY: Glucose-Capillary: 187 mg/dL — ABNORMAL HIGH (ref 70–99)

## 2023-02-17 LAB — HEMOGLOBIN AND HEMATOCRIT, BLOOD
HCT: 35.7 % — ABNORMAL LOW (ref 36.0–46.0)
Hemoglobin: 11.7 g/dL — ABNORMAL LOW (ref 12.0–15.0)

## 2023-02-17 MED ORDER — LIVING WELL WITH DIABETES BOOK
Freq: Once | Status: DC
  Filled 2023-02-17: qty 1

## 2023-02-17 NOTE — Discharge Summary (Signed)
Physician Discharge Summary   Patient: Sabrina Aguirre MRN: [id number] DOB: [birth year]  Admit date:     02/13/2023  Discharge date: 02/17/23  Discharge Physician: Alba Cory   PCP: Lula Olszewski, MD   Recommendations at discharge:   Needs CBC to follow Hb.  Further adjustment of insulin regimen depending on CBG  Needs B-met to follow electrolytes.  PT to help with deconditioning, and dizziness.   Discharge Diagnoses: Principal Problem:   BRBPR (bright red blood per rectum) Active Problems:   Uncontrolled type 2 diabetes mellitus with hyperglycemia, without long-term current use of insulin (HCC)   High anion gap metabolic acidosis   Hypokalemia  Resolved Problems:   * No resolved hospital problems. *  Hospital Course: 82 year old with past medical history significant for diabetes type 2, hypertension presents to the ED with complaint of rectal bleeding ongoing for some time, patient reported bright red blood per rectum.  She also reports loose stool. She has had some cough. She has recently diagnose with ear infection, her PC was having concern with mastoiditis, and prescribe another 7 days of antibiotics on June 20th. She has been evaluated by her PCP for dizziness, she even had MRI brain 6/03: negative for stroke.    CT angio abdomen and pelvis done 6/25: No acute intra-abdominal or pelvic pathology. No evidence of active GI bleed.    Assessment and Plan: 1-DKA; Diabetes type 2 Patient presents with CBG 500, gap of 25.  Bicarb of 16, beta butyrate acid 8 She was treated with insulin drip and IV fluids Anion gap is closed, down to 9, bicarb 22, blood glucose 136 Continue with Levemir 15 units daily. 3 units novolog meals coverage at discharge.  A1c. More than 15.  Previously taking metformin.  She will need to be discharge on Insulin.  CBG better controlled.  Suspect worsening hyperglycemia in setting of recent ear infection.    2-Hypokalemia; Replaced.     3-Hyponatremia, and pseudohyponatremia in setting hyperglycemia dn dehydration.  Improved with IV fluids and correction. Resolved.    4-Dizziness; She has had MRI brain done on 6/3 for same negative for stroke.  Suspect related to hyperglycemia, dehydration, multiples electrolytes abnormalities, recent ear infection.  -PT for vestibular evaluation.  -She had recent tx for ear infection.  -ECHO. Normal EF, no significant valve abnormalities.  -Improved. Orthostatic vitals today negative.  -B 12 not low.  Improved. She now just feels weak.    5-GI bleed; BRB per rectum; likely diverticular Bleed.  Continue to monitor hb. Down to 11 form 13 GI has been consulted. Underwent colonoscopy; Diverticulosis.  CTA negative for bleeding.  Hb stable at 11.    6-Hallucination, likely in setting delirium, hyperglycemia. Resolved.  She reporter seen red truck in her room.    Hypotension;  Suspect related to dehydration in setting of hyperglycemia.  Improved with IV fluids.  Hold Home BP medications.  BP improved.  Resume metoprolol lower dose, holding Micardis at discharge.  Tachycardia; sinus. Resume lower home dose metoprolol. BP increasing.  Resolved.    HTN; Hold micardis in setting recent dehydration and hypotension. Resume out patient if BP increases.   Recent ear infection.  She has left 2 more days of antibiotics. Wont resume antibiotics. She has now completed 15 days antibiotics.    Diarrhea; resolved. No further BM     See wound documentation below.      Pressure Injury 02/13/23 Rectum Medial;Upper Stage 2 -  Partial thickness loss of  dermis presenting as a shallow open injury with a red, pink wound bed without slough. (Active)  02/13/23 2200  Location: Rectum  Location Orientation: Medial;Upper  Staging: Stage 2 -  Partial thickness loss of dermis presenting as a shallow open injury with a red, pink wound bed without slough.  Wound Description (Comments):   Present on  Admission: Yes  Dressing Type Foam - Lift dressing to assess site every shift 02/15/23 2055        Estimated body mass index is 26.04 kg/m as calculated from the following:   Height as of this encounter: 5\' 4"  (1.626 m).   Weight as of this encounter: 68.8 kg.       Consultants: GI Procedures performed: Colonoscopy   Disposition: Home Diet recommendation:  Discharge Diet Orders (From admission, onward)     Start     Ordered   02/17/23 0000  Diet Carb Modified        02/17/23 0831   02/16/23 0000  Diet - low sodium heart healthy        02/16/23 1159           Carb modified diet DISCHARGE MEDICATION: Allergies as of 02/17/2023       Reactions   Pollen Extract    Other Reaction(s): Other (See Comments) Runny nose, itchy/watery eyes        Medication List     STOP taking these medications    amoxicillin 500 MG capsule Commonly known as: AMOXIL   Lancets Misc. Misc   metFORMIN 500 MG tablet Commonly known as: GLUCOPHAGE   onetouch ultrasoft lancets   telmisartan-hydrochlorothiazide 80-25 MG tablet Commonly known as: Micardis HCT       TAKE these medications    aspirin 81 MG tablet Take 81 mg by mouth daily.   azelastine 0.1 % nasal spray Commonly known as: ASTELIN Place 1 spray into both nostrils 2 (two) times daily.   blood glucose meter kit and supplies Dispense based on patient and insurance preference. Use up to two times daily as directed. (FOR ICD-10 E10.9, E11.9).   Blood Glucose Monitoring Suppl Devi 1 each by Does not apply route in the morning, at noon, and at bedtime. May substitute to any manufacturer covered by patient's insurance.   BLOOD GLUCOSE TEST STRIPS Strp 1 each by In Vitro route in the morning, at noon, and at bedtime. May substitute to any manufacturer covered by patient's insurance.   CALCIUM 600/VITAMIN D3 PO Take 1 tablet by mouth daily.   Cinnamon 500 MG Tabs Take 1 tablet by mouth daily.   COLLAGEN PO Take  1 packet by mouth daily. power   cyanocobalamin 1000 MCG tablet Commonly known as: VITAMIN B12 Take 1,000 mcg by mouth daily.   docusate sodium 100 MG capsule Commonly known as: COLACE Take 100 mg by mouth 2 (two) times daily.   fish oil-omega-3 fatty acids 1000 MG capsule Take 1 g by mouth daily.   fluticasone 50 MCG/ACT nasal spray Commonly known as: FLONASE Use 1 spray(s) in each nostril once daily What changed: See the new instructions.   FreeStyle Libre 3 Reader Devi 1 Application by Does not apply route daily.   FreeStyle Libre 3 Sensor Misc 1 Application by Does not apply route daily at 12 noon. Place 1 sensor on the skin every 14 days. Use to check glucose continuously   insulin aspart 100 UNIT/ML FlexPen Commonly known as: NOVOLOG Inject 3 Units into the skin 3 (three) times daily with  meals.   insulin glargine 100 UNIT/ML Solostar Pen Commonly known as: LANTUS Inject 15 Units into the skin daily.   Lancet Device Misc 1 each by Does not apply route in the morning, at noon, and at bedtime. May substitute to any manufacturer covered by patient's insurance.   latanoprost 0.005 % ophthalmic solution Commonly known as: XALATAN Place 1 drop into both eyes at bedtime.   magnesium oxide 400 (240 Mg) MG tablet Commonly known as: MAG-OX Take 1 tablet (400 mg total) by mouth 2 (two) times daily for 10 days. Notes to patient: This Medication is to be taken two times a day for 10 Days   MAGNESIUM PO Take 1 tablet by mouth daily.   metoprolol succinate 100 MG 24 hr tablet Commonly known as: TOPROL-XL Take 0.5 tablets (50 mg total) by mouth daily. Take with or immediately following a meal. What changed: how much to take   montelukast 10 MG tablet Commonly known as: SINGULAIR Take 1 tablet (10 mg total) by mouth at bedtime.   Multi-Vitamins Tabs Take 1 tablet by mouth daily.   omeprazole 20 MG capsule Commonly known as: PRILOSEC Take 1 capsule by mouth once  daily   SUMAtriptan 100 MG tablet Commonly known as: IMITREX TAKE 1/2 (ONE-HALF) TABLET BY MOUTH EVERY 2 HOURS AS NEEDED What changed: See the new instructions.   vitamin C 1000 MG tablet Take 1,000 mg by mouth daily.   Vitamin D3 25 MCG (1000 UT) Caps Take 1,000 Units by mouth daily.   XYZAL PO Take 1 tablet by mouth daily as needed (allergy).               Discharge Care Instructions  (From admission, onward)           Start     Ordered   02/17/23 0000  Discharge wound care:       Comments: See above   02/17/23 0831   02/16/23 0000  Discharge wound care:       Comments: See above   02/16/23 1159            Follow-up Information     [facility] Follow up.   Why: Home Health will follow up with you 24hrs to 48hrs after discharge.        Lula Olszewski, MD Follow up in 1 week(s).   Specialty: Internal Medicine Contact information: [address] [place] [phone]                Discharge Exam: Sabrina Aguirre Weights   02/13/23 2200  Weight: 68.8 kg   General; NAD  Condition at discharge: stable  The results of significant diagnostics from this hospitalization (including imaging, microbiology, ancillary and laboratory) are listed below for reference.   Imaging Studies: ECHOCARDIOGRAM COMPLETE  Result Date: 02/15/2023    ECHOCARDIOGRAM REPORT   Patient Name:   Sabrina Aguirre Date of Exam: 02/15/2023 Medical Rec #:  [id number]       Height:       64.0 in Accession #:    [id number]      Weight:       151.7 lb Date of Birth:  May 10, 1941        BSA:          1.739 m Patient Age:    82 years        BP:           123/72 mmHg Patient Gender: F  HR:           92 bpm. Exam Location:  Inpatient Procedure: 2D Echo, Cardiac Doppler and Color Doppler Indications:    Abnormal EKG  History:        Patient has prior history of Echocardiogram examinations, most                 recent 07/22/2017. Arrythmias:atrial  tachycardia; Risk                 Factors:Diabetes, Hypertension and Former Smoker.  Sonographer:    Wallie Char Referring Phys: [phone] Ersilia Brawley A Loreda Silverio  Sonographer Comments: Technically difficult study due to poor echo windows. IMPRESSIONS  1. Left ventricular ejection fraction, by estimation, is 60 to 65%. The left ventricle has normal function. The left ventricle has no regional wall motion abnormalities. There is mild concentric left ventricular hypertrophy. Left ventricular diastolic parameters are consistent with Grade I diastolic dysfunction (impaired relaxation).  2. Right ventricular systolic function is normal. The right ventricular size is normal.  3. Left atrial size was mildly dilated.  4. The mitral valve is grossly normal. Trivial mitral valve regurgitation.  5. The aortic valve is tricuspid. There is mild calcification of the aortic valve. There is mild thickening of the aortic valve. Aortic valve regurgitation is trivial. Aortic valve sclerosis/calcification is present, without any evidence of aortic stenosis.  6. Aortic dilatation noted. There is borderline dilatation of the ascending aorta, measuring 39 mm. Comparison(s): No prior Echocardiogram. FINDINGS  Left Ventricle: Left ventricular ejection fraction, by estimation, is 60 to 65%. The left ventricle has normal function. The left ventricle has no regional wall motion abnormalities. The left ventricular internal cavity size was normal in size. There is  mild concentric left ventricular hypertrophy. Left ventricular diastolic parameters are consistent with Grade I diastolic dysfunction (impaired relaxation). Right Ventricle: The right ventricular size is normal. No increase in right ventricular wall thickness. Right ventricular systolic function is normal. Left Atrium: Left atrial size was mildly dilated. Right Atrium: Right atrial size was normal in size. Pericardium: There is no evidence of pericardial effusion. Mitral Valve: The mitral  valve is grossly normal. There is mild thickening of the mitral valve leaflet(s). There is mild calcification of the mitral valve leaflet(s). Trivial mitral valve regurgitation. MV peak gradient, 3.6 mmHg. The mean mitral valve gradient is 2.0 mmHg. Tricuspid Valve: The tricuspid valve is normal in structure. Tricuspid valve regurgitation is trivial. Aortic Valve: The aortic valve is tricuspid. There is mild calcification of the aortic valve. There is mild thickening of the aortic valve. Aortic valve regurgitation is trivial. Aortic regurgitation PHT measures 376 msec. Aortic valve sclerosis/calcification is present, without any evidence of aortic stenosis. Aortic valve mean gradient measures 5.0 mmHg. Aortic valve peak gradient measures 8.9 mmHg. Aortic valve area, by VTI measures 2.35 cm. Pulmonic Valve: The pulmonic valve was normal in structure. Pulmonic valve regurgitation is trivial. Aorta: Aortic dilatation noted. There is borderline dilatation of the ascending aorta, measuring 39 mm. IAS/Shunts: The atrial septum is grossly normal.  LEFT VENTRICLE PLAX 2D LVIDd:         3.60 cm     Diastology LVIDs:         2.40 cm     LV e' medial:    5.34 cm/s LV PW:         1.10 cm     LV E/e' medial:  16.0 LV IVS:        1.10 cm  LV e' lateral:   8.20 cm/s LVOT diam:     1.90 cm     LV E/e' lateral: 10.4 LV SV:         66 LV SV Index:   38 LVOT Area:     2.84 cm  LV Volumes (MOD) LV vol d, MOD A2C: 43.6 ml LV vol d, MOD A4C: 44.0 ml LV vol s, MOD A2C: 14.9 ml LV vol s, MOD A4C: 15.0 ml LV SV MOD A2C:     28.7 ml LV SV MOD A4C:     44.0 ml LV SV MOD BP:      28.8 ml RIGHT VENTRICLE             IVC RV S prime:     19.00 cm/s  IVC diam: 1.30 cm TAPSE (M-mode): 1.8 cm LEFT ATRIUM             Index        RIGHT ATRIUM           Index LA diam:        3.60 cm 2.07 cm/m   RA Area:     14.50 cm LA Vol (A2C):   60.1 ml 34.55 ml/m  RA Volume:   31.20 ml  17.94 ml/m LA Vol (A4C):   53.2 ml 30.58 ml/m LA Biplane Vol: 59.5  ml 34.21 ml/m  AORTIC VALVE AV Area (Vmax):    2.25 cm AV Area (Vmean):   2.16 cm AV Area (VTI):     2.35 cm AV Vmax:           149.50 cm/s AV Vmean:          105.050 cm/s AV VTI:            0.279 m AV Peak Grad:      8.9 mmHg AV Mean Grad:      5.0 mmHg LVOT Vmax:         118.50 cm/s LVOT Vmean:        80.200 cm/s LVOT VTI:          0.232 m LVOT/AV VTI ratio: 0.83 AI PHT:            376 msec  AORTA Ao Root diam: 3.30 cm Ao Asc diam:  3.90 cm MITRAL VALVE MV Area (PHT): 3.17 cm     SHUNTS MV Area VTI:   2.95 cm     Systemic VTI:  0.23 m MV Peak grad:  3.6 mmHg     Systemic Diam: 1.90 cm MV Mean grad:  2.0 mmHg MV Vmax:       0.95 m/s MV Vmean:      70.4 cm/s MV Decel Time: 239 msec MV E velocity: 85.30 cm/s MV A velocity: 100.00 cm/s MV E/A ratio:  0.85 Laurance Flatten MD Electronically signed by Laurance Flatten MD Signature Date/Time: 02/15/2023/2:25:25 PM    Final    CT Angio Abd/Pel W and/or Wo Contrast  Result Date: 02/13/2023 CLINICAL DATA:  GI bleed. EXAM: CTA ABDOMEN AND PELVIS WITHOUT AND WITH CONTRAST TECHNIQUE: Multidetector CT imaging of the abdomen and pelvis was performed using the standard protocol during bolus administration of intravenous contrast. Multiplanar reconstructed images and MIPs were obtained and reviewed to evaluate the vascular anatomy. RADIATION DOSE REDUCTION: This exam was performed according to the departmental dose-optimization program which includes automated exposure control, adjustment of the mA and/or kV according to patient size and/or use of iterative reconstruction technique. CONTRAST:  OMNIPAQUE  IOHEXOL 350 MG/ML SOLN COMPARISON:  CT abdomen pelvis dated 01/22/2023. FINDINGS: VASCULAR Aorta: Mild atherosclerotic calcification. No aneurysmal dilatation or dissection. No periaortic fluid collection. Celiac: The celiac trunk and its major branches are patent. SMA: Patent without evidence of aneurysm, dissection, vasculitis or significant stenosis. Renals:  Both renal arteries are patent without evidence of aneurysm, dissection, vasculitis, fibromuscular dysplasia or significant stenosis. IMA: Patent without evidence of aneurysm, dissection, vasculitis or significant stenosis. Inflow: Mild atherosclerotic calcification. No aneurysmal dilatation or dissection. The iliac arteries are patent. Proximal Outflow: The visualized proximal outflow is patent. Veins: The IVC is unremarkable. The splenic vein, SMV, and main portal vein are patent. No portal venous gas. Review of the MIP images confirms the above findings. NON-VASCULAR Lower chest: The visualized lung bases are clear. No intra-abdominal free air or free fluid. Hepatobiliary: Mild irregularity of the liver contour suspicious for changes of cirrhosis. Clinical correlation is recommended. No biliary dilatation. Cholecystectomy. No retained calcified stone noted in the central CBD. Pancreas: Unremarkable. No pancreatic ductal dilatation or surrounding inflammatory changes. Spleen: Normal in size without focal abnormality. Adrenals/Urinary Tract: The right adrenal gland is unremarkable. There is a 1.5 cm indeterminate left adrenal nodule, present dating back to 2018, likely an adenoma. Nonobstructing left renal calculi measure up to 7 mm in the inferior pole of the left kidney. There is no hydronephrosis on either side. There is symmetric enhancement of the kidneys. The visualized ureters and urinary bladder appear unremarkable. Stomach/Bowel: There is no bowel obstruction or active inflammation. No evidence of active GI bleed. The appendix is normal. Lymphatic: There is no adenopathy. Reproductive: Hysterectomy.  No adnexal masses. Other: None Musculoskeletal: Degenerative changes of the spine. No acute osseous pathology. IMPRESSION: 1. No acute intra-abdominal or pelvic pathology. No evidence of active GI bleed. 2. No bowel obstruction. Normal appendix. 3. Nonobstructing left renal calculi. No hydronephrosis. 4. Mild  irregularity of the liver contour suspicious for changes of cirrhosis. Electronically Signed   By: Elgie Collard M.D.   On: 02/13/2023 19:25   DG Chest 2 View  Addendum Date: 01/23/2023   ADDENDUM REPORT: 01/23/2023 00:00 ADDENDUM: These results were called by telephone at the time of interpretation on 01/22/2023 at 3:54 p.m. to provider Vonita Moss , who verbally acknowledged these results. Electronically Signed   By: Darliss Cheney M.D.   On: 01/23/2023 00:00   Result Date: 01/23/2023 CLINICAL DATA:  Cough for 1 month EXAM: CHEST - 2 VIEW COMPARISON:  Chest x-ray 05/25/2022 FINDINGS: There are findings concerning for air under the right hemidiaphragm. Mild elevation of the right hemidiaphragm is unchanged. The lungs are clear. There is no pleural effusion or pneumothorax. Cardiomediastinal silhouette is within normal limits. No acute fractures are seen. IMPRESSION: 1. Findings concerning for free air under the right hemidiaphragm. Please correlate clinically. This can be further evaluated with CT of the abdomen or pelvis or decubitus view of the abdomen with abdominal series. 2. No acute cardiopulmonary process. Electronically Signed: By: Darliss Cheney M.D. On: 01/22/2023 15:47   MR BRAIN WO CONTRAST  Result Date: 01/22/2023 CLINICAL DATA:  Ataxia EXAM: MRI HEAD WITHOUT CONTRAST TECHNIQUE: Multiplanar, multiecho pulse sequences of the brain and surrounding structures were obtained without intravenous contrast. COMPARISON:  01/09/2012 FINDINGS: Brain: No acute infarct, mass effect or extra-axial collection. No acute or chronic hemorrhage. There is multifocal hyperintense T2-weighted signal within the white matter. Parenchymal volume and CSF spaces are normal. The midline structures are normal. Vascular: Major flow voids are preserved. Skull  and upper cervical spine: Normal calvarium and skull base. Visualized upper cervical spine and soft tissues are normal. Sinuses/Orbits:No paranasal sinus fluid levels  or advanced mucosal thickening. No mastoid or middle ear effusion. Normal orbits. IMPRESSION: 1. No acute intracranial abnormality. 2. Findings of chronic small vessel ischemia. Electronically Signed   By: Deatra Robinson M.D.   On: 01/22/2023 20:20   CT ABDOMEN PELVIS WO CONTRAST  Result Date: 01/22/2023 CLINICAL DATA:  Queried pneumoperitoneum on chest radiograph. Patient presenting with altered mental status in the setting of maxillary sinusitis. EXAM: CT ABDOMEN AND PELVIS WITHOUT CONTRAST TECHNIQUE: Multidetector CT imaging of the abdomen and pelvis was performed following the standard protocol without IV contrast. RADIATION DOSE REDUCTION: This exam was performed according to the departmental dose-optimization program which includes automated exposure control, adjustment of the mA and/or kV according to patient size and/or use of iterative reconstruction technique. COMPARISON:  CT abdomen and pelvis dated 07/21/2017 FINDINGS: Lower chest: Tree-in-bud nodules in the lingula. No pleural effusion or pneumothorax demonstrated. Partially imaged heart size is normal. Coronary artery calcifications. Hepatobiliary: No focal hepatic lesions. No intra or extrahepatic biliary ductal dilation. Cholecystectomy. Pancreas: No focal lesions or main ductal dilation. Spleen: Normal in size without focal abnormality. Adrenals/Urinary Tract: 1.3 cm left adrenal nodule is unchanged from 07/21/2017, likely adenoma. No specific follow-up imaging recommended. Right adrenal nodule. No suspicious renal mass or hydronephrosis. Multiple nonobstructing left renal stones. No focal bladder wall thickening. Stomach/Bowel: Normal appearance of the stomach. No evidence of bowel wall thickening, distention, or inflammatory changes. Colonic diverticulosis without acute diverticulitis. Normal appendix. Vascular/Lymphatic: Aortic atherosclerosis. No enlarged abdominal or pelvic lymph nodes. Reproductive: No adnexal masses. Other: Elevation of the  right hemidiaphragm. No free fluid, fluid collection, or free air. Musculoskeletal: No acute or abnormal lytic or blastic osseous lesions. Multilevel degenerative changes of the partially imaged thoracic and lumbar spine. Postsurgical changes of the anterior abdominal wall. IMPRESSION: 1. No acute abnormality in the abdomen or pelvis. Specifically, no pneumoperitoneum. Queried finding on chest radiograph is likely due to superimposition of the elevated right hemidiaphragm on normally aerated lung. 2. Tree-in-bud nodules in the lingula, likely infectious or inflammatory. 3. Nonobstructing left renal stones. 4. Colonic diverticulosis without acute diverticulitis. 5. Aortic Atherosclerosis (ICD10-I70.0). Coronary artery calcifications. Assessment for potential risk factor modification, dietary therapy or pharmacologic therapy may be warranted, if clinically indicated. Electronically Signed   By: Agustin Cree M.D.   On: 01/22/2023 18:02   CT Head Wo Contrast  Result Date: 01/22/2023 CLINICAL DATA:  dizziness, ear pain; ear pain, effusion on exam, dizziness. Concern for right-sided mastoiditis. EXAM: CT HEAD AND TEMPORAL BONES WITHOUT CONTRAST TECHNIQUE: Contiguous axial images were obtained from the base of the skull through the vertex without contrast. Multidetector CT imaging of the temporal bones was performed using the standard protocol without intravenous contrast. RADIATION DOSE REDUCTION: This exam was performed according to the departmental dose-optimization program which includes automated exposure control, adjustment of the mA and/or kV according to patient size and/or use of iterative reconstruction technique. COMPARISON:  Head CT 04/02/2022. FINDINGS: CT HEAD FINDINGS Brain: No acute intracranial hemorrhage. Gray-white differentiation is preserved. No hydrocephalus or extra-axial collection. No mass effect or midline shift. Vascular: No hyperdense vessel or unexpected calcification. Skull: No calvarial  fracture or suspicious bone lesion. Skull base is unremarkable. Sinuses/Orbits: Unremarkable. Other: None. CT TEMPORAL BONES FINDINGS External auditory canals are unremarkable. Tympanic membranes are thin. Scutum is sharp bilaterally. Mastoids and middle ear cavities are well aerated. Ossicles  are intact. Cochlea, vestibule and semicircular canals are normal bilaterally. Vestibular aqueducts are not enlarged. IAC's are unremarkable. The facial nerves follow a normal course and are well covered with bone. Carotid canals, sigmoid plates and jugular bulbs are unremarkable. IMPRESSION: 1. No acute intracranial abnormality. 2. Unremarkable appearance of the temporal bones. No evidence of mastoiditis. Electronically Signed   By: Orvan Falconer M.D.   On: 01/22/2023 15:46   CT Temporal Bones Wo Contrast  Result Date: 01/22/2023 CLINICAL DATA:  dizziness, ear pain; ear pain, effusion on exam, dizziness. Concern for right-sided mastoiditis. EXAM: CT HEAD AND TEMPORAL BONES WITHOUT CONTRAST TECHNIQUE: Contiguous axial images were obtained from the base of the skull through the vertex without contrast. Multidetector CT imaging of the temporal bones was performed using the standard protocol without intravenous contrast. RADIATION DOSE REDUCTION: This exam was performed according to the departmental dose-optimization program which includes automated exposure control, adjustment of the mA and/or kV according to patient size and/or use of iterative reconstruction technique. COMPARISON:  Head CT 04/02/2022. FINDINGS: CT HEAD FINDINGS Brain: No acute intracranial hemorrhage. Gray-white differentiation is preserved. No hydrocephalus or extra-axial collection. No mass effect or midline shift. Vascular: No hyperdense vessel or unexpected calcification. Skull: No calvarial fracture or suspicious bone lesion. Skull base is unremarkable. Sinuses/Orbits: Unremarkable. Other: None. CT TEMPORAL BONES FINDINGS External auditory canals are  unremarkable. Tympanic membranes are thin. Scutum is sharp bilaterally. Mastoids and middle ear cavities are well aerated. Ossicles are intact. Cochlea, vestibule and semicircular canals are normal bilaterally. Vestibular aqueducts are not enlarged. IAC's are unremarkable. The facial nerves follow a normal course and are well covered with bone. Carotid canals, sigmoid plates and jugular bulbs are unremarkable. IMPRESSION: 1. No acute intracranial abnormality. 2. Unremarkable appearance of the temporal bones. No evidence of mastoiditis. Electronically Signed   By: Orvan Falconer M.D.   On: 01/22/2023 15:46    Microbiology: Results for orders placed or performed during the hospital encounter of 07/21/17  MRSA PCR Screening     Status: None   Collection Time: 07/22/17  2:56 AM   Specimen: Nasal Mucosa; Nasopharyngeal  Result Value Ref Range Status   MRSA by PCR NEGATIVE NEGATIVE Final    Comment:        The GeneXpert MRSA Assay (FDA approved for NASAL specimens only), is one component of a comprehensive MRSA colonization surveillance program. It is not intended to diagnose MRSA infection nor to guide or monitor treatment for MRSA infections.     Labs: CBC: Recent Labs  Lab 02/13/23 1710 02/13/23 2016 02/14/23 0054 02/14/23 0557 02/14/23 1447 02/14/23 2307 02/15/23 0302 02/17/23 0828  WBC 11.5* 9.3  --   --   --   --  8.2  --   NEUTROABS  --  8.2*  --   --   --   --   --   --   HGB 13.5 12.6   < > 11.8* 11.5* 11.9* 11.5* 11.7*  HCT 40.2 36.8   < > 34.4* 34.0* 35.0* 34.0* 35.7*  MCV 87.0 86.4  --   --   --   --  88.1  --   PLT 213 180  --   --   --   --  142*  --    < > = values in this interval not displayed.   Basic Metabolic Panel: Recent Labs  Lab 02/14/23 0054 02/14/23 0311 02/14/23 0557 02/14/23 1224 02/15/23 0302 02/16/23 1214  NA 130* 132* 133* 133* 137  138  K 2.8* 2.7* 2.9* 4.1 3.4* 3.7  CL 92* 96* 98 102 107 108  CO2 21* 21* 25 22 24 22   GLUCOSE 135* 177*  150* 136* 144* 217*  BUN 12 12 10 9 9  <5*  CREATININE 0.82 0.86 0.75 0.67 0.67 0.60  CALCIUM 9.1 8.9 9.1 8.9 8.8* 8.5*  MG 1.5*  --   --   --   --   --    Liver Function Tests: Recent Labs  Lab 02/13/23 1710  AST 21  ALT 20  ALKPHOS 48  BILITOT 2.2*  PROT 7.3  ALBUMIN 3.9   CBG: Recent Labs  Lab 02/16/23 0702 02/16/23 1238 02/16/23 1634 02/16/23 2054 02/17/23 0800  GLUCAP 150* 202* 141* 93 187*    Discharge time spent: greater than 30 minutes.  Signed: Alba Cory, MD [facility] 02/17/2023

## 2023-02-17 NOTE — Progress Notes (Signed)
Patient to be discharged to home today All discharge instructions including all discharge Medications and schedules for these Medications. Diabetes teaching reviewed with the Patient and the family. Diabetes Book with Patient at time of discharge. Understanding verbalized by Patient and the Family. Discharge AVS with the Patient at time of discharge

## 2023-02-18 ENCOUNTER — Encounter (HOSPITAL_COMMUNITY): Payer: Self-pay | Admitting: Gastroenterology

## 2023-02-18 DIAGNOSIS — K625 Hemorrhage of anus and rectum: Secondary | ICD-10-CM | POA: Diagnosis not present

## 2023-02-18 DIAGNOSIS — J45909 Unspecified asthma, uncomplicated: Secondary | ICD-10-CM | POA: Diagnosis not present

## 2023-02-18 DIAGNOSIS — I152 Hypertension secondary to endocrine disorders: Secondary | ICD-10-CM | POA: Diagnosis not present

## 2023-02-18 DIAGNOSIS — E1165 Type 2 diabetes mellitus with hyperglycemia: Secondary | ICD-10-CM | POA: Diagnosis not present

## 2023-02-18 DIAGNOSIS — D1724 Benign lipomatous neoplasm of skin and subcutaneous tissue of left leg: Secondary | ICD-10-CM | POA: Diagnosis not present

## 2023-02-18 DIAGNOSIS — E876 Hypokalemia: Secondary | ICD-10-CM | POA: Diagnosis not present

## 2023-02-18 DIAGNOSIS — I4719 Other supraventricular tachycardia: Secondary | ICD-10-CM | POA: Diagnosis not present

## 2023-02-18 DIAGNOSIS — D3500 Benign neoplasm of unspecified adrenal gland: Secondary | ICD-10-CM | POA: Diagnosis not present

## 2023-02-18 DIAGNOSIS — E1159 Type 2 diabetes mellitus with other circulatory complications: Secondary | ICD-10-CM | POA: Diagnosis not present

## 2023-02-19 ENCOUNTER — Telehealth: Payer: Self-pay

## 2023-02-19 LAB — SURGICAL PATHOLOGY

## 2023-02-19 NOTE — Transitions of Care (Post Inpatient/ED Visit) (Signed)
   02/19/2023  Name: Sabrina Aguirre MRN: [id number] DOB: 04-09-1941  Today's TOC FU Call Status: Today's TOC FU Call Status:: Unsuccessul Call (1st Attempt) Unsuccessful Call (1st Attempt) Date: 02/19/23  Attempted to reach the patient regarding the most recent Inpatient/ED visit.  Follow Up Plan: Additional outreach attempts will be made to reach the patient to complete the Transitions of Care (Post Inpatient/ED visit) call.   Antionette Fairy, RN,BSN,CCM [facility] Care Management Care Management Community Coordinator Direct Phone: [phone] Toll Free: [phone] Fax: [phone]

## 2023-02-20 ENCOUNTER — Telehealth: Payer: Self-pay

## 2023-02-20 ENCOUNTER — Other Ambulatory Visit: Payer: Self-pay | Admitting: Family Medicine

## 2023-02-20 ENCOUNTER — Other Ambulatory Visit: Payer: Self-pay

## 2023-02-20 ENCOUNTER — Telehealth: Payer: Self-pay | Admitting: Internal Medicine

## 2023-02-20 DIAGNOSIS — E119 Type 2 diabetes mellitus without complications: Secondary | ICD-10-CM

## 2023-02-20 NOTE — Transitions of Care (Post Inpatient/ED Visit) (Signed)
   02/20/2023  Name: Sabrina Aguirre MRN: [id number] DOB: [birth year]  Today's TOC FU Call Status: Today's TOC FU Call Status:: Unsuccessful Call (3rd Attempt) Unsuccessful Call (3rd Attempt) Date: 02/20/23 (Female answered the phone and identified herself as pt's grandaughter. She stated pt was busy at the moment. Requested that pt call RN CM back when she was available to talk.)  Attempted to reach the patient regarding the most recent Inpatient/ED visit.  Follow Up Plan: No further outreach attempts will be made at this time. We have been unable to contact the patient.  Antionette Fairy, RN,BSN,CCM [facility] Care Management Care Management Community Coordinator Direct Phone: [phone] Toll Free: [phone] Fax: [phone]

## 2023-02-20 NOTE — Telephone Encounter (Signed)
Pt states she has spoken to [place] about getting a referral to Dr Corwin Levins, Diabetic Specialist - [phone]. Please advise.

## 2023-02-20 NOTE — Telephone Encounter (Signed)
Placed Endocrinology referral.

## 2023-02-20 NOTE — Transitions of Care (Post Inpatient/ED Visit) (Signed)
02/20/2023  Name: Sabrina Aguirre MRN: [id number] DOB: 1941/07/20  Today's TOC FU Call Status: Today's TOC FU Call Status:: Successful TOC FU Call Competed TOC FU Call Complete Date: 02/20/23 (Incoming call from pt returning RN CM call.)  Transition Care Management Follow-up Telephone Call Date of Discharge: 02/17/23 Discharge Facility: [facility]) Type of Discharge: Inpatient Admission Primary Inpatient Discharge Diagnosis:: "rectal bleeding" How have you been since you were released from the hospital?: Better (Pt states she is doing better. She was initially "crying from being overwhelmed with trying to manage everything but feeling & doing better." CBGs today have been 201 and 221. She is adhering to diet restrictions. LBM yest-and normal.) Any questions or concerns?: Yes Patient Questions/Concerns:: Pt stts she called PP office today to request referral to endcorinologist-Dr. Katrinka Blazing that she would like to see for DM mgmt. Patient wanting to now how long it takes to get arranged. Patient Questions/Concerns Addressed: Other: (Advised pt that message had been sent to PCP to get approval for referral and that it can take several days once referal sent for office to reach out to schedule an appt. She is aware to follow back up if she doesn't hear anything within a few days.)  Items Reviewed: Did you receive and understand the discharge instructions provided?: Yes Medications obtained,verified, and reconciled?: Yes (Medications Reviewed) Any new allergies since your discharge?: No Dietary orders reviewed?: Yes Type of Diet Ordered:: low salt/heart healthy/carb modified Do you have support at home?: Yes People in Home: alone, child(ren), adult Name of Support/Comfort Primary Source: daughter and son assists with care as needed  Medications Reviewed Today: Medications Reviewed Today     Reviewed by Charlyn Minerva, RN (Registered Nurse) on 02/20/23 at 1412  Med List Status:  <None>   Medication Order Taking? Sig Documenting Provider Last Dose Status Informant  Ascorbic Acid (VITAMIN C) 1000 MG tablet [id number] Yes Take 1,000 mg by mouth daily. [provider] Taking Active Family Member, Self  aspirin 81 MG tablet [id number] Yes Take 81 mg by mouth daily. [provider] Taking Active Family Member, Self  azelastine (ASTELIN) 0.1 % nasal spray [id number] Yes Place 1 spray into both nostrils 2 (two) times daily. Willow Ora, MD Taking Active Family Member, Self  blood glucose meter kit and supplies [id number] Yes Dispense based on patient and insurance preference. Use up to two times daily as directed. (FOR ICD-10 E10.9, E11.9). Alba Cory, MD Taking Active   Blood Glucose Monitoring Suppl DEVI [id number] Yes 1 each by Does not apply route in the morning, at noon, and at bedtime. May substitute to any manufacturer covered by patient's insurance. Regalado, Prentiss Bells, MD Taking Active   Calcium Carb-Cholecalciferol (CALCIUM 600/VITAMIN D3 PO) [id number] Yes Take 1 tablet by mouth daily.  [provider] Taking Active Family Member, Self  Cholecalciferol (VITAMIN D3) 1000 units CAPS [id number] Yes Take 1,000 Units by mouth daily. [provider] Taking Active Family Member, Self  Cinnamon 500 MG TABS [id number] Yes Take 1 tablet by mouth daily. [provider] Taking Active Family Member, Self  COLLAGEN PO [id number] Yes Take 1 packet by mouth daily. power [provider] Taking Active Family Member, Self  Continuous Glucose Receiver (FREESTYLE LIBRE 3 READER) DEVI [id number] Yes 1 Application by Does not apply route daily. Regalado, Prentiss Bells, MD Taking Active   Continuous Glucose Sensor (FREESTYLE LIBRE 3 SENSOR) [place] [id number] Yes 1 Application by Does not apply route daily at 12  noon. Place 1 sensor on the skin every 14 days. Use to check glucose continuously Regalado, Belkys A, MD Taking Active   docusate sodium (COLACE)  100 MG capsule [id number] Yes Take 100 mg by mouth 2 (two) times daily. [provider] Taking Active Family Member, Self  fish oil-omega-3 fatty acids 1000 MG capsule [id number] Yes Take 1 g by mouth daily.  [provider] Taking Active Family Member, Self  fluticasone (FLONASE) 50 MCG/ACT nasal spray [id number] Yes Use 1 spray(s) in each nostril once daily  Patient taking differently: Place 1 spray into both nostrils daily as needed for allergies or rhinitis.   Willow Ora, MD Taking Active Family Member, Self  Glucose Blood (BLOOD GLUCOSE TEST STRIPS) STRP [id number] No 1 each by In Vitro route in the morning, at noon, and at bedtime. May substitute to any manufacturer covered by patient's insurance.  Patient not taking: Reported on 02/20/2023   Lula Olszewski, MD Not Taking Active Family Member, Self  insulin aspart (NOVOLOG) 100 UNIT/ML FlexPen [id number] Yes Inject 3 Units into the skin 3 (three) times daily with meals. Regalado, Belkys A, MD Taking Active   insulin glargine (LANTUS) 100 UNIT/ML Solostar Pen [id number] Yes Inject 15 Units into the skin daily. Alba Cory, MD Taking Active   Lancet Device MISC [id number] No 1 each by Does not apply route in the morning, at noon, and at bedtime. May substitute to any manufacturer covered by patient's insurance.  Patient not taking: Reported on 02/20/2023   Hartley Barefoot A, MD Not Taking Active   latanoprost (XALATAN) 0.005 % ophthalmic solution [id number] Yes Place 1 drop into both eyes at bedtime. [provider] Taking Active Family Member, Self  Levocetirizine Dihydrochloride (XYZAL PO) [id number] Yes Take 1 tablet by mouth daily as needed (allergy). [provider] Taking Active Family Member, Self  magnesium oxide (MAG-OX) 400 (240 Mg) MG tablet [id number] Yes Take 1 tablet (400 mg total) by mouth 2 (two) times daily for 10 days. Alba Cory, MD Taking Active   MAGNESIUM PO [id number] Yes Take 1  tablet by mouth daily. [provider] Taking Active Family Member, Self  metoprolol succinate (TOPROL-XL) 100 MG 24 hr tablet [id number] Yes Take 0.5 tablets (50 mg total) by mouth daily. Take with or immediately following a meal. Regalado, Belkys A, MD Taking Active   montelukast (SINGULAIR) 10 MG tablet [id number] Yes Take 1 tablet (10 mg total) by mouth at bedtime. Willow Ora, MD Taking Active Family Member, Self  Multiple Vitamin (MULTI-VITAMINS) TABS [id number] Yes Take 1 tablet by mouth daily. [provider] Taking Active Family Member, Self    Discontinued 11/13/11 [phone] (Error)     Discontinued 11/13/11 0943 (Error)   omeprazole (PRILOSEC) 20 MG capsule [id number] Yes Take 1 capsule by mouth once daily Willow Ora, MD Taking Active   SUMAtriptan (IMITREX) 100 MG tablet [id number] Yes TAKE 1/2 (ONE-HALF) TABLET BY MOUTH EVERY 2 HOURS AS NEEDED  Patient taking differently: Take 100 mg by mouth every 2 (two) hours as needed for migraine or headache. TAKE 1/2 (ONE-HALF) TABLET BY MOUTH EVERY 2 HOURS AS NEEDED   Willow Ora, MD Taking Active Family Member, Self  vitamin B-12 (CYANOCOBALAMIN) 1000 MCG tablet [id number] Yes Take 1,000 mcg by mouth daily. [provider] Taking Active Family Member, Self            Home Care and Equipment/Supplies: Were Home Health Services Ordered?: Yes Name of Home Health  Agency:: [facility] Has Agency set up a time to come to your home?: Yes First Home Health Visit Date: 02/18/23 Any new equipment or medical supplies ordered?: No  Functional Questionnaire: Do you need assistance with bathing/showering or dressing?: No Do you need assistance with meal preparation?: No Do you need assistance with eating?: No Do you have difficulty maintaining continence: No Do you need assistance with getting out of bed/getting out of a chair/moving?: No Do you have difficulty managing or taking your medications?: No  Follow up  appointments reviewed: PCP Follow-up appointment confirmed?: Yes Date of PCP follow-up appointment?: 02/26/23 Follow-up Provider: Dr. Jon Billings Specialist [facility] Follow-up appointment confirmed?: NA Do you need transportation to your follow-up appointment?: No Do you understand care options if your condition(s) worsen?: Yes-patient verbalized understanding  SDOH Interventions Today    Flowsheet Row Most Recent Value  SDOH Interventions   Food Insecurity Interventions Intervention Not Indicated  Transportation Interventions Intervention Not Indicated      TOC Interventions Today    Flowsheet Row Most Recent Value  TOC Interventions   TOC Interventions Discussed/Reviewed TOC Interventions Discussed, Arranged PCP follow up less than 12 days/Care Guide scheduled      Interventions Today    Flowsheet Row Most Recent Value  Chronic Disease   Chronic disease during today's visit Diabetes  General Interventions   General Interventions Discussed/Reviewed General Interventions Discussed, Doctor Visits, Durable Medical Equipment (DME), Referral to Nurse  [follow up appt scheduled with RN care coordinator]  Doctor Visits Discussed/Reviewed Doctor Visits Discussed, PCP, Specialist  Durable Medical Equipment (DME) Glucomoter  PCP/Specialist Visits Compliance with follow-up visit  Education Interventions   Education Provided Provided Education  Provided Verbal Education On Nutrition, When to see the doctor, Medication, Blood Sugar Monitoring  Nutrition Interventions   Nutrition Discussed/Reviewed Nutrition Discussed, Adding fruits and vegetables, Fluid intake, Decreasing salt, Decreasing sugar intake, Carbohydrate meal planning, Supplemental nutrition  Pharmacy Interventions   Pharmacy Dicussed/Reviewed Pharmacy Topics Discussed, Medications and their functions  Safety Interventions   Safety Discussed/Reviewed Safety Discussed        Alessandra Grout [facility]  Care Management Care Management Community Coordinator Direct Phone: [phone] Toll Free: [phone] Fax: [phone]

## 2023-02-20 NOTE — Transitions of Care (Post Inpatient/ED Visit) (Signed)
   02/20/2023  Name: Sabrina Aguirre MRN: [id number] DOB: [birth year]  Today's TOC FU Call Status: Today's TOC FU Call Status:: Unsuccessful Call (2nd Attempt) Unsuccessful Call (2nd Attempt) Date: 02/20/23  Attempted to reach the patient regarding the most recent Inpatient/ED visit. Spoke briefly with patient as she reported she was doing "okay since coming home." She voices that she was getting ready to take a shower while her granddaughter was in the home to assist her and requested that RN CM call her back later.  Follow Up Plan: Additional outreach attempts will be made to reach the patient to complete the Transitions of Care (Post Inpatient/ED visit) call.     Antionette Fairy, RN,BSN,CCM [facility] Care Management Care Management Community Coordinator Direct Phone: [phone] Toll Free: [phone] Fax: [phone]

## 2023-02-21 ENCOUNTER — Telehealth: Payer: Self-pay | Admitting: Internal Medicine

## 2023-02-21 ENCOUNTER — Other Ambulatory Visit: Payer: Self-pay | Admitting: Nurse Practitioner

## 2023-02-21 DIAGNOSIS — D3500 Benign neoplasm of unspecified adrenal gland: Secondary | ICD-10-CM | POA: Diagnosis not present

## 2023-02-21 DIAGNOSIS — I152 Hypertension secondary to endocrine disorders: Secondary | ICD-10-CM | POA: Diagnosis not present

## 2023-02-21 DIAGNOSIS — E1159 Type 2 diabetes mellitus with other circulatory complications: Secondary | ICD-10-CM | POA: Diagnosis not present

## 2023-02-21 DIAGNOSIS — J45909 Unspecified asthma, uncomplicated: Secondary | ICD-10-CM | POA: Diagnosis not present

## 2023-02-21 DIAGNOSIS — E876 Hypokalemia: Secondary | ICD-10-CM | POA: Diagnosis not present

## 2023-02-21 DIAGNOSIS — D1724 Benign lipomatous neoplasm of skin and subcutaneous tissue of left leg: Secondary | ICD-10-CM | POA: Diagnosis not present

## 2023-02-21 DIAGNOSIS — E1165 Type 2 diabetes mellitus with hyperglycemia: Secondary | ICD-10-CM | POA: Diagnosis not present

## 2023-02-21 DIAGNOSIS — K625 Hemorrhage of anus and rectum: Secondary | ICD-10-CM | POA: Diagnosis not present

## 2023-02-21 DIAGNOSIS — I4719 Other supraventricular tachycardia: Secondary | ICD-10-CM | POA: Diagnosis not present

## 2023-02-21 NOTE — Telephone Encounter (Signed)
Patient dropped off document Home Health Certificate (Order ID [id number]), to be filled out by provider. Patient requested to send it back via Fax within 5-days. Document is located in providers tray at front office.Please advise

## 2023-02-23 DIAGNOSIS — K625 Hemorrhage of anus and rectum: Secondary | ICD-10-CM | POA: Diagnosis not present

## 2023-02-23 DIAGNOSIS — I4719 Other supraventricular tachycardia: Secondary | ICD-10-CM | POA: Diagnosis not present

## 2023-02-23 DIAGNOSIS — D1724 Benign lipomatous neoplasm of skin and subcutaneous tissue of left leg: Secondary | ICD-10-CM | POA: Diagnosis not present

## 2023-02-23 DIAGNOSIS — E1159 Type 2 diabetes mellitus with other circulatory complications: Secondary | ICD-10-CM | POA: Diagnosis not present

## 2023-02-23 DIAGNOSIS — J45909 Unspecified asthma, uncomplicated: Secondary | ICD-10-CM | POA: Diagnosis not present

## 2023-02-23 DIAGNOSIS — D3500 Benign neoplasm of unspecified adrenal gland: Secondary | ICD-10-CM | POA: Diagnosis not present

## 2023-02-23 DIAGNOSIS — I152 Hypertension secondary to endocrine disorders: Secondary | ICD-10-CM | POA: Diagnosis not present

## 2023-02-23 DIAGNOSIS — E1165 Type 2 diabetes mellitus with hyperglycemia: Secondary | ICD-10-CM | POA: Diagnosis not present

## 2023-02-23 DIAGNOSIS — E876 Hypokalemia: Secondary | ICD-10-CM | POA: Diagnosis not present

## 2023-02-26 ENCOUNTER — Inpatient Hospital Stay: Payer: Medicare PPO | Admitting: Internal Medicine

## 2023-02-26 DIAGNOSIS — E8729 Other acidosis: Secondary | ICD-10-CM

## 2023-02-26 DIAGNOSIS — J45909 Unspecified asthma, uncomplicated: Secondary | ICD-10-CM | POA: Diagnosis not present

## 2023-02-26 DIAGNOSIS — I152 Hypertension secondary to endocrine disorders: Secondary | ICD-10-CM | POA: Diagnosis not present

## 2023-02-26 DIAGNOSIS — D3500 Benign neoplasm of unspecified adrenal gland: Secondary | ICD-10-CM | POA: Diagnosis not present

## 2023-02-26 DIAGNOSIS — I4719 Other supraventricular tachycardia: Secondary | ICD-10-CM | POA: Diagnosis not present

## 2023-02-26 DIAGNOSIS — E1159 Type 2 diabetes mellitus with other circulatory complications: Secondary | ICD-10-CM | POA: Diagnosis not present

## 2023-02-26 DIAGNOSIS — K625 Hemorrhage of anus and rectum: Secondary | ICD-10-CM | POA: Diagnosis not present

## 2023-02-26 DIAGNOSIS — K219 Gastro-esophageal reflux disease without esophagitis: Secondary | ICD-10-CM

## 2023-02-26 DIAGNOSIS — M19041 Primary osteoarthritis, right hand: Secondary | ICD-10-CM

## 2023-02-26 DIAGNOSIS — D1724 Benign lipomatous neoplasm of skin and subcutaneous tissue of left leg: Secondary | ICD-10-CM | POA: Diagnosis not present

## 2023-02-26 DIAGNOSIS — E1165 Type 2 diabetes mellitus with hyperglycemia: Secondary | ICD-10-CM | POA: Diagnosis not present

## 2023-02-26 DIAGNOSIS — E876 Hypokalemia: Secondary | ICD-10-CM | POA: Diagnosis not present

## 2023-02-26 NOTE — Telephone Encounter (Signed)
Forms were completed and faxed today. 

## 2023-02-27 ENCOUNTER — Ambulatory Visit: Payer: Self-pay

## 2023-02-27 ENCOUNTER — Telehealth: Payer: Self-pay | Admitting: Internal Medicine

## 2023-02-27 DIAGNOSIS — I4719 Other supraventricular tachycardia: Secondary | ICD-10-CM | POA: Diagnosis not present

## 2023-02-27 DIAGNOSIS — E1165 Type 2 diabetes mellitus with hyperglycemia: Secondary | ICD-10-CM | POA: Diagnosis not present

## 2023-02-27 DIAGNOSIS — E876 Hypokalemia: Secondary | ICD-10-CM | POA: Diagnosis not present

## 2023-02-27 DIAGNOSIS — K625 Hemorrhage of anus and rectum: Secondary | ICD-10-CM | POA: Diagnosis not present

## 2023-02-27 DIAGNOSIS — D3500 Benign neoplasm of unspecified adrenal gland: Secondary | ICD-10-CM | POA: Diagnosis not present

## 2023-02-27 DIAGNOSIS — D1724 Benign lipomatous neoplasm of skin and subcutaneous tissue of left leg: Secondary | ICD-10-CM | POA: Diagnosis not present

## 2023-02-27 DIAGNOSIS — E1159 Type 2 diabetes mellitus with other circulatory complications: Secondary | ICD-10-CM | POA: Diagnosis not present

## 2023-02-27 DIAGNOSIS — I152 Hypertension secondary to endocrine disorders: Secondary | ICD-10-CM | POA: Diagnosis not present

## 2023-02-27 DIAGNOSIS — J45909 Unspecified asthma, uncomplicated: Secondary | ICD-10-CM | POA: Diagnosis not present

## 2023-02-27 NOTE — Patient Instructions (Signed)
Visit Information  Thank you for taking time to visit with me today. Please don't hesitate to contact me if I can be of assistance to you.   Following are the goals we discussed today:   Goals Addressed             This Visit's Progress    Diabetes Management       Patient Goals/Self Care Activities: -Patient/Caregiver will self-administer medications as prescribed as evidenced by self-report/primary caregiver report  -Patient/Caregiver will attend all scheduled provider appointments as evidenced by clinician review of documented attendance to scheduled appointments and patient/caregiver report -Patient/Caregiver will call provider office for new concerns or questions as evidenced by review of documented incoming telephone call notes and patient report  -check blood sugar at prescribed times -check blood sugar if I feel it is too high or too low -record values and write them down take them to all doctor visits  -check feet daily for cuts, sores or redness    Patient with newly diabetes. Discussed diabetes management and importance.  BS 154 this am. Discussed eating at regularly scheduled times. Patient also has history of diverticulitis. Discussed elimination of nuts and seeds in diet.  She verbalized understanding. No concerns.         Our next appointment is by telephone on 03/27/23 at 1000 am  Please call the care guide team at [phone] if you need to cancel or reschedule your appointment.   If you are experiencing a Mental Health or Behavioral Health Crisis or need someone to talk to, please call the Suicide and Crisis Lifeline: 988   The patient verbalized understanding of instructions, educational materials, and care plan provided today and agreed to receive a mailed copy of patient instructions, educational materials, and care plan.   The patient has been provided with contact information for the care management team and has been advised to call with any health related  questions or concerns.   Bary Leriche, RN, MSN [facility] Care Management Care Management Coordinator Direct Line [phone]

## 2023-02-27 NOTE — Patient Outreach (Signed)
  Care Coordination   Initial Visit Note   02/27/2023 Name: Sabrina Aguirre MRN: [id number] DOB: Oct 18, 1940  Sabrina Aguirre is a 82 y.o. year old female who sees Lula Olszewski, MD for primary care. I spoke with  [name] by phone today.  What matters to the patients health and wellness today?  Diabetes control    Goals Addressed             This Visit's Progress    Diabetes Management       Patient Goals/Self Care Activities: -Patient/Caregiver will self-administer medications as prescribed as evidenced by self-report/primary caregiver report  -Patient/Caregiver will attend all scheduled provider appointments as evidenced by clinician review of documented attendance to scheduled appointments and patient/caregiver report -Patient/Caregiver will call provider office for new concerns or questions as evidenced by review of documented incoming telephone call notes and patient report  -check blood sugar at prescribed times -check blood sugar if I feel it is too high or too low -record values and write them down take them to all doctor visits  -check feet daily for cuts, sores or redness    Patient with newly diabetes. Discussed diabetes management and importance.  BS 154 this am. Discussed eating at regularly scheduled times. Patient also has history of diverticulitis. Discussed elimination of nuts and seeds in diet.  She verbalized understanding. No concerns.         SDOH assessments and interventions completed:  Yes  SDOH Interventions Today    Flowsheet Row Most Recent Value  SDOH Interventions   Food Insecurity Interventions Intervention Not Indicated  Housing Interventions Intervention Not Indicated  Transportation Interventions Intervention Not Indicated  Alcohol Usage Interventions Intervention Not Indicated (Score <7)        Care Coordination Interventions:  Yes, provided   Follow up plan: Follow up call scheduled for August    Encounter Outcome:  Pt.  Visit Completed   Bary Leriche, RN, MSN [facility] Care Management Care Management Coordinator Direct Line [phone]

## 2023-02-27 NOTE — Telephone Encounter (Signed)
Prescription Request  02/27/2023  LOV: 02/08/2023  What is the name of the medication or equipment?  benzonatate (TESSALON) 100 MG capsule    Pt states former PCP, Dr. Mardelle Matte prescribed this regularly for her cough. Pt does have hospital f/u on 7/15   Have you contacted your pharmacy to request a refill? Yes   Which pharmacy would you like this sent to?  [facility] [address] [place] - [address] [place] [address] Phone: [phone] Fax: [phone]    Patient notified that their request is being sent to the clinical staff for review and that they should receive a response within 2 business days.   Please advise at Mobile [phone] (mobile)

## 2023-02-28 ENCOUNTER — Other Ambulatory Visit: Payer: Self-pay

## 2023-02-28 MED ORDER — BENZONATATE 100 MG PO CAPS: 100.0000 mg | ORAL_CAPSULE | Freq: Two times a day (BID) | ORAL | 0 refills | Status: DC | PRN

## 2023-02-28 NOTE — Telephone Encounter (Signed)
Sent refill to requested pharmacy

## 2023-03-02 DIAGNOSIS — I4719 Other supraventricular tachycardia: Secondary | ICD-10-CM | POA: Diagnosis not present

## 2023-03-02 DIAGNOSIS — E1159 Type 2 diabetes mellitus with other circulatory complications: Secondary | ICD-10-CM | POA: Diagnosis not present

## 2023-03-02 DIAGNOSIS — E876 Hypokalemia: Secondary | ICD-10-CM | POA: Diagnosis not present

## 2023-03-02 DIAGNOSIS — I152 Hypertension secondary to endocrine disorders: Secondary | ICD-10-CM | POA: Diagnosis not present

## 2023-03-02 DIAGNOSIS — D3500 Benign neoplasm of unspecified adrenal gland: Secondary | ICD-10-CM | POA: Diagnosis not present

## 2023-03-02 DIAGNOSIS — E1165 Type 2 diabetes mellitus with hyperglycemia: Secondary | ICD-10-CM | POA: Diagnosis not present

## 2023-03-02 DIAGNOSIS — K625 Hemorrhage of anus and rectum: Secondary | ICD-10-CM | POA: Diagnosis not present

## 2023-03-02 DIAGNOSIS — J45909 Unspecified asthma, uncomplicated: Secondary | ICD-10-CM | POA: Diagnosis not present

## 2023-03-02 DIAGNOSIS — D1724 Benign lipomatous neoplasm of skin and subcutaneous tissue of left leg: Secondary | ICD-10-CM | POA: Diagnosis not present

## 2023-03-03 DIAGNOSIS — E1165 Type 2 diabetes mellitus with hyperglycemia: Secondary | ICD-10-CM | POA: Diagnosis not present

## 2023-03-03 DIAGNOSIS — J45909 Unspecified asthma, uncomplicated: Secondary | ICD-10-CM | POA: Diagnosis not present

## 2023-03-03 DIAGNOSIS — I4719 Other supraventricular tachycardia: Secondary | ICD-10-CM | POA: Diagnosis not present

## 2023-03-03 DIAGNOSIS — K625 Hemorrhage of anus and rectum: Secondary | ICD-10-CM | POA: Diagnosis not present

## 2023-03-03 DIAGNOSIS — E1159 Type 2 diabetes mellitus with other circulatory complications: Secondary | ICD-10-CM | POA: Diagnosis not present

## 2023-03-03 DIAGNOSIS — E876 Hypokalemia: Secondary | ICD-10-CM | POA: Diagnosis not present

## 2023-03-03 DIAGNOSIS — I152 Hypertension secondary to endocrine disorders: Secondary | ICD-10-CM | POA: Diagnosis not present

## 2023-03-03 DIAGNOSIS — D3500 Benign neoplasm of unspecified adrenal gland: Secondary | ICD-10-CM | POA: Diagnosis not present

## 2023-03-03 DIAGNOSIS — D1724 Benign lipomatous neoplasm of skin and subcutaneous tissue of left leg: Secondary | ICD-10-CM | POA: Diagnosis not present

## 2023-03-05 ENCOUNTER — Encounter: Payer: Self-pay | Admitting: Internal Medicine

## 2023-03-05 ENCOUNTER — Ambulatory Visit: Payer: Medicare PPO | Admitting: Internal Medicine

## 2023-03-05 VITALS — BP 170/89 | HR 97 | Temp 97.8°F | Ht 64.0 in | Wt 157.2 lb

## 2023-03-05 DIAGNOSIS — Z8719 Personal history of other diseases of the digestive system: Secondary | ICD-10-CM

## 2023-03-05 DIAGNOSIS — L89159 Pressure ulcer of sacral region, unspecified stage: Secondary | ICD-10-CM | POA: Insufficient documentation

## 2023-03-05 DIAGNOSIS — E108 Type 1 diabetes mellitus with unspecified complications: Secondary | ICD-10-CM

## 2023-03-05 DIAGNOSIS — E1069 Type 1 diabetes mellitus with other specified complication: Secondary | ICD-10-CM | POA: Diagnosis not present

## 2023-03-05 DIAGNOSIS — R051 Acute cough: Secondary | ICD-10-CM

## 2023-03-05 DIAGNOSIS — E876 Hypokalemia: Secondary | ICD-10-CM | POA: Diagnosis not present

## 2023-03-05 DIAGNOSIS — K5790 Diverticulosis of intestine, part unspecified, without perforation or abscess without bleeding: Secondary | ICD-10-CM

## 2023-03-05 DIAGNOSIS — I2510 Atherosclerotic heart disease of native coronary artery without angina pectoris: Secondary | ICD-10-CM | POA: Diagnosis not present

## 2023-03-05 DIAGNOSIS — K7460 Unspecified cirrhosis of liver: Secondary | ICD-10-CM

## 2023-03-05 DIAGNOSIS — N200 Calculus of kidney: Secondary | ICD-10-CM

## 2023-03-05 HISTORY — DX: Pressure ulcer of sacral region, unspecified stage: L89.159

## 2023-03-05 MED ORDER — GLUCAGON 1 MG/0.2ML ~~LOC~~ SOAJ
0.2000 mL | SUBCUTANEOUS | 5 refills | Status: AC | PRN
Start: 2023-03-05 — End: ?

## 2023-03-05 MED ORDER — FREESTYLE LIBRE 3 SENSOR MISC: 1.0000 | Freq: Every day | 0 refills | Status: DC

## 2023-03-05 NOTE — Assessment & Plan Note (Signed)
Possible Hypokalemia and Hypomagnesemia: She has recently had low potassium levels and is currently taking an over-the-counter magnesium supplement, just finished. We will recheck potassium and magnesium levels. Currently off supplement and may not need further

## 2023-03-05 NOTE — Patient Instructions (Addendum)
VISIT SUMMARY:  During your recent visit, we discussed your history of Type 2 diabetes and diverticular disease, as well as your recent hospitalization due to high blood glucose levels and persistent dizziness. We also discussed your recent experiences of significant rectal bleeding, a persistent dry cough, and muscle weakness. We noted the possibility of a new onset of Type 1 diabetes and will refer you to an endocrinologist for further evaluation. We also discussed your diverticular disease, chronic cough, possible low potassium and magnesium levels, and incidental findings of a kidney stone and possible liver abnormality.  YOUR PLAN:  -POSSIBLE NEW ONSET TYPE 1 DIABETES: You were recently hospitalized for a condition typically associated with Type 1 diabetes. We will refer you to an endocrinologist for further evaluation and continue your current insulin regimen. We will also order some tests and a continuous glucose monitor to help manage your blood glucose levels.  -DIVERTICULAR DISEASE WITH RECENT BLEEDING: You experienced significant rectal bleeding likely due to diverticula. We advise a high fiber diet and will recheck your blood counts to ensure stability after the recent bleeding.  -CHRONIC COUGH: You have had a persistent dry cough since your recent hospitalization. We will order a chest x-ray and consider antibiotics if your symptoms worsen.  -POSSIBLE HYPOKALEMIA AND HYPOMAGNESEMIA: You have recently had low potassium levels and are currently taking a magnesium supplement. We will recheck your potassium and magnesium levels.  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 No immediate action is needed; we will monitor for symptoms.  -PRESSURE INJURY: You had a recent pressure injury from an ICU stay, which has now resolved. No further action is  needed.  INSTRUCTIONS:  Please continue your current medications as prescribed and follow up after your lab results are available. We will also refer you to an endocrinologist for further evaluation of your diabetes.  It was a pleasure seeing you today! Your health and satisfaction are our top priorities.  Glenetta Hew, MD  Your Providers PCP: Lula Olszewski, MD,  [phone]) Referring Provider: Lula Olszewski, MD,  [phone]) Care Team Provider: Thurmon Fair, MD,  [phone]) Care Team Provider: Charna Elizabeth, MD,  [phone]) Care Team Provider: Thurmon Fair, MD,  [phone]) Care Team Provider: Fleeta Emmer, RN  NEXT STEPS: [x]  Early Intervention: Schedule sooner appointment, call our on-call services, or go to emergency room if there is any significant Increase in pain or discomfort New or worsening symptoms Sudden or severe changes in your health [x]  Flexible Follow-Up: We recommend a No follow-ups on file. for optimal routine care. This allows for progress monitoring and treatment adjustments. [x]  Preventive Care: Schedule your annual preventive care visit! It's typically covered by insurance and helps identify potential health issues early. [x]  Lab & X-ray Appointments: Incomplete tests scheduled today, or call to schedule. X-rays: [facility] (M-F, 8:30am-noon or 1pm-5pm). [x]  Medical Information Release: Sign a release form at front desk to obtain relevant medical information we don't have.  MAKING THE MOST OF OUR FOCUSED 20 MINUTE APPOINTMENTS: [x]   Clearly state your top concerns at the beginning of the visit to focus our discussion [x]   If you anticipate you will need more time, please inform the front desk during scheduling - we can book multiple appointments in the same week. [x]   If you have transportation problems- use our convenient video appointments or ask about transportation support. [x]   We can get down to business faster  if you use MyChart to update information before the visit and submit non-urgent questions before your visit. Thank you for taking the time to provide details through MyChart.  Let our nurse know and she can import this information into your encounter documents.  Arrival and Wait Times: [x]   Arriving on time ensures that everyone receives prompt attention. [x]   Early morning (8a) and afternoon (1p) appointments tend to have shortest wait times. [x]   Unfortunately, we cannot delay appointments for late arrivals or hold slots during phone calls.  Getting Answers and Following Up [x]   Simple Questions & Concerns: For quick questions or basic follow-up after your visit, reach [place] at [phone] or MyChart messaging. [x]   Complex Concerns: If your concern is more complex, scheduling an appointment might be best. Discuss this with the staff to find the most suitable option. [x]   Lab & Imaging Results: We'll contact you directly if results are abnormal or you don't use MyChart. Most normal results will be on MyChart within 2-3 business days, with a review message from Dr. Jon Billings. Haven't heard back in 2 weeks? Need results sooner? Contact us at [phone]. [x]   Referrals: Our referral coordinator will manage specialist referrals. The specialist's office should contact you within 2 weeks to schedule an appointment. Call us if you haven't heard from them after 2 weeks.  Staying Connected [x]   MyChart: Activate your MyChart for the fastest way to access results and message [place]. See the last page of this paperwork for instructions on how to activate.  Bring to Your Next Appointment [x]   Medications: Please bring all your medication bottles to your next appointment to ensure we have an accurate record of your prescriptions. [x]   Health Diaries: If you're monitoring any health conditions at home, keeping a diary of your readings can be very helpful for discussions at your next appointment.  Billing [x]    X-ray & Lab Orders: These are billed by separate companies. Contact the invoicing company directly for questions or concerns. [x]   Visit Charges: Discuss any billing inquiries with our administrative services team.  Your Satisfaction Matters [x]   Share Your Experience: We strive for your satisfaction! If you have any complaints, or preferably compliments, please let Dr. Jon Billings know directly or contact our Practice Administrators, Edwena Felty or [employer], by asking at the front desk.   Reviewing Your Records [x]   Review this early draft of your clinical encounter notes below and the final encounter summary tomorrow on MyChart after its been completed.  All orders placed so far are visible here: Type 1 diabetes mellitus with complications [facility]) Assessment & Plan: Possible New Onset Type 1 Diabetes: Recently hospitalized for DKA with a blood glucose level of 500, which occurred as she increased metformin to deal with high sugars that has since been stopped.  She is currently on insulin with recent blood glucose levels under 200. We discussed the unusual presentation of DKA in someone with a history of Type 2 diabetes and the possibility of a new onset of Type 1 diabetes. We will urgently refer her to endocrinology for further evaluation, continue the current insulin regimen, order insulin antibodies and beta hydroxybutyrate levels, and order a continuous glucose monitor from [facility].  Seems well-controlled at present with current insulin regimen and since she has plenty, no change or refills needed at this time.  Orders: -     Ambulatory referral to Endocrinology -     Beta-hydroxybutyric acid -  Insulin and C-Peptide -     Glutamic acid decarboxylase auto abs -     Glucagon; Inject 0.2 mLs into the skin as needed (for sugar under 60 with symptoms.).  Dispense: 0.2 mL; Refill: 5 -     FreeStyle Libre 3 Sensor; 1 Application by Does not apply route daily at 12 noon. Place 1 sensor on the  skin every 14 days. Use to check glucose continuously  Dispense: 6 each; Refill: 0  History of GI diverticular bleed Assessment & Plan: Diverticular Disease with Recent Bleeding: She experienced significant rectal bleeding likely from diverticula, with no current bleeding or constipation reported. We advise a high fiber diet, considering FiberWell gummies, and will recheck blood counts to ensure stability after the recent bleeding.   Diverticular disease  Hypokalemia Assessment & Plan: Possible Hypokalemia and Hypomagnesemia: She has recently had low potassium levels and is currently taking an over-the-counter magnesium supplement, just finished. We will recheck potassium and magnesium levels. Currently off supplement and may not need further  Orders: -     CBC with Differential/Platelet -     Comprehensive metabolic panel  Hypomagnesemia -     Magnesium  Acute cough Assessment & Plan: Chronic Cough: She has had a persistent dry cough since her recent hospitalization, with possible pneumonia noted on a previous CT scan from June 3rd. We will order a chest x-ray and consider antibiotics if symptoms worsen. Advised patient if any fevers or sputum will prescribe- high risk due to recent ICU admission and poorly controlled diabetes mellitus.  Medications reviewed, no angiotensin converting enzyme inhibitor (ACEI) Lungs and hent exam normal, cause uncertain.  No other respiratory tract symptoms, just a dry cough at present.  Orders: -     DG Chest 2 View; Future  Pressure injury of skin of sacral region, unspecified injury stage Assessment & Plan:  Pressure Injury: She had a recent pressure injury from an ICU stay, now resolved. No further action is needed.   Cirrhosis of liver without ascites, unspecified hepatic cirrhosis type (HCC)  Kidney stone  Coronary artery disease involving native coronary artery of native heart without angina pectoris

## 2023-03-05 NOTE — Assessment & Plan Note (Signed)
Possible New Onset Type 1 Diabetes: Recently hospitalized for DKA with a blood glucose level of 500, which occurred as she increased metformin to deal with high sugars that has since been stopped.  She is currently on insulin with recent blood glucose levels under 200. We discussed the unusual presentation of DKA in someone with a history of Type 2 diabetes and the possibility of a new onset of Type 1 diabetes. We will urgently refer her to endocrinology for further evaluation, continue the current insulin regimen, order insulin antibodies and beta hydroxybutyrate levels, and order a continuous glucose monitor from [facility].  Seems well-controlled at present with current insulin regimen and since she has plenty, no change or refills needed at this time.

## 2023-03-05 NOTE — Assessment & Plan Note (Signed)
Diverticular Disease with Recent Bleeding: She experienced significant rectal bleeding likely from diverticula, with no current bleeding or constipation reported. We advise a high fiber diet, considering FiberWell gummies, and will recheck blood counts to ensure stability after the recent bleeding.

## 2023-03-05 NOTE — Assessment & Plan Note (Addendum)
Chronic Cough: She has had a persistent dry cough since her recent hospitalization, with possible pneumonia noted on a previous CT scan from June 3rd. We will order a chest x-ray and consider antibiotics if symptoms worsen. Advised patient if any fevers or sputum will prescribe- high risk due to recent ICU admission and poorly controlled diabetes mellitus.  Medications reviewed, no angiotensin converting enzyme inhibitor (ACEI) Lungs and hent exam normal, cause uncertain.  No other respiratory tract symptoms, just a dry cough at present.

## 2023-03-05 NOTE — Progress Notes (Signed)
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         1.10 cm     LV E/e' medial:  16.0 LV IVS:        1.10 cm     LV e' lateral:   8.20 cm/s LVOT diam:     1.90 cm     LV E/e' lateral: 10.4 LV SV:         66 LV SV Index:   38 LVOT Area:     2.84 cm  LV Volumes (MOD) LV vol d, MOD A2C: 43.6 ml LV vol d, MOD A4C: 44.0 ml LV vol s, MOD A2C: 14.9 ml LV vol s, MOD A4C: 15.0 ml LV SV MOD A2C:     28.7 ml LV SV MOD A4C:     44.0 ml LV SV MOD BP:      28.8 ml RIGHT VENTRICLE             IVC RV S prime:     19.00 cm/s  IVC diam: 1.30 cm TAPSE (M-mode): 1.8 cm LEFT ATRIUM             Index        RIGHT ATRIUM           Index LA diam:        3.60 cm 2.07 cm/m   RA Area:     14.50 cm LA Vol (A2C):   60.1 ml 34.55 ml/m  RA Volume:   31.20 ml  17.94 ml/m LA Vol (A4C):   53.2 ml 30.58 ml/m LA Biplane Vol: 59.5 ml 34.21 ml/m  AORTIC VALVE AV Area (Vmax):    2.25 cm AV Area (Vmean):  2.16 cm AV Area (VTI):     2.35 cm AV Vmax:            149.50 cm/s AV Vmean:          105.050 cm/s AV VTI:            0.279 m AV Peak Grad:      8.9 mmHg AV Mean Grad:      5.0 mmHg LVOT Vmax:         118.50 cm/s LVOT Vmean:        80.200 cm/s LVOT VTI:          0.232 m LVOT/AV VTI ratio: 0.83 AI PHT:            376 msec  AORTA Ao Root diam: 3.30 cm Ao Asc diam:  3.90 cm MITRAL VALVE MV Area (PHT): 3.17 cm     SHUNTS MV Area VTI:   2.95 cm     Systemic VTI:  0.23 m MV Peak grad:  3.6 mmHg     Systemic Diam: 1.90 cm MV Mean grad:  2.0 mmHg MV Vmax:       0.95 m/s MV Vmean:      70.4 cm/s MV Decel Time: 239 msec MV E velocity: 85.30 cm/s MV A velocity: 100.00 cm/s MV E/A ratio:  0.85 Sabrina Flatten MD Electronically signed by Sabrina Flatten MD Signature Date/Time: 02/15/2023/2:25:25 PM    Final    CT Angio Abd/Pel W and/or Wo Contrast  Result Date: 02/13/2023 CLINICAL DATA:  GI bleed. EXAM: CTA ABDOMEN AND PELVIS WITHOUT AND WITH CONTRAST TECHNIQUE: Multidetector CT imaging of the abdomen and pelvis was performed using the standard protocol during bolus administration of intravenous contrast. Multiplanar reconstructed images and MIPs were obtained and reviewed to evaluate the vascular anatomy. RADIATION DOSE REDUCTION: This exam was performed according to the departmental dose-optimization program which includes automated exposure control, adjustment of the mA and/or kV according to patient size and/or use of iterative reconstruction technique. CONTRAST:  OMNIPAQUE IOHEXOL 350 MG/ML SOLN COMPARISON:  CT abdomen pelvis dated 01/22/2023. FINDINGS: VASCULAR Aorta: Mild atherosclerotic calcification. No aneurysmal dilatation or dissection. No periaortic fluid collection. Celiac: The celiac trunk and its major branches are patent. SMA: Patent without evidence of aneurysm, dissection, vasculitis or significant stenosis. Renals: Both renal arteries are patent without evidence of aneurysm, dissection, vasculitis, fibromuscular dysplasia or significant stenosis.  IMA: Patent without evidence of aneurysm, dissection, vasculitis or significant stenosis. Inflow: Mild atherosclerotic calcification. No aneurysmal dilatation or dissection. The iliac arteries are patent. Proximal Outflow: The visualized proximal outflow is patent. Veins: The IVC is unremarkable. The splenic vein, SMV, and main portal vein are patent. No portal venous gas. Review of the MIP images confirms the above findings. NON-VASCULAR Lower chest: The visualized lung bases are clear. No intra-abdominal free air or free fluid. Hepatobiliary: Mild irregularity of the liver contour suspicious for changes of cirrhosis. Clinical correlation is recommended. No biliary dilatation. Cholecystectomy. No retained calcified stone noted in the central CBD. Pancreas: Unremarkable. No pancreatic ductal dilatation or surrounding inflammatory changes. Spleen: Normal in size without focal abnormality. Adrenals/Urinary Tract: The right adrenal gland is unremarkable. There is a 1.5 cm indeterminate left adrenal nodule, present dating back to 2018, likely an adenoma. Nonobstructing left renal calculi measure up to 7 mm in the inferior pole of the left kidney. There is no hydronephrosis on either side. There is symmetric enhancement of the kidneys. The visualized ureters and urinary bladder appear unremarkable. Stomach/Bowel:  There is no bowel obstruction or active inflammation. No evidence of active GI bleed. The appendix is normal. Lymphatic: There is no adenopathy. Reproductive: Hysterectomy.  No adnexal masses. Other: None Musculoskeletal: Degenerative changes of the spine. No acute osseous pathology. IMPRESSION: 1. No acute intra-abdominal or pelvic pathology. No evidence of active GI bleed. 2. No bowel obstruction. Normal appendix. 3. Nonobstructing left renal calculi. No hydronephrosis. 4. Mild irregularity of the liver contour suspicious for changes of cirrhosis. Electronically Signed   By: Elgie Collard M.D.   On:  02/13/2023 19:25   DG Chest 2 View  Addendum Date: 01/23/2023   ADDENDUM REPORT: 01/23/2023 00:00 ADDENDUM: These results were called by telephone at the time of interpretation on 01/22/2023 at 3:54 p.m. to provider Vonita Moss , who verbally acknowledged these results. Electronically Signed   By: Darliss Cheney M.D.   On: 01/23/2023 00:00   Result Date: 01/23/2023 CLINICAL DATA:  Cough for 1 month EXAM: CHEST - 2 VIEW COMPARISON:  Chest x-ray 05/25/2022 FINDINGS: There are findings concerning for air under the right hemidiaphragm. Mild elevation of the right hemidiaphragm is unchanged. The lungs are clear. There is no pleural effusion or pneumothorax. Cardiomediastinal silhouette is within normal limits. No acute fractures are seen. IMPRESSION: 1. Findings concerning for free air under the right hemidiaphragm. Please correlate clinically. This can be further evaluated with CT of the abdomen or pelvis or decubitus view of the abdomen with abdominal series. 2. No acute cardiopulmonary process. Electronically Signed: By: Darliss Cheney M.D. On: 01/22/2023 15:47   MR BRAIN WO CONTRAST  Result Date: 01/22/2023 CLINICAL DATA:  Ataxia EXAM: MRI HEAD WITHOUT CONTRAST TECHNIQUE: Multiplanar, multiecho pulse sequences of the brain and surrounding structures were obtained without intravenous contrast. COMPARISON:  01/09/2012 FINDINGS: Brain: No acute infarct, mass effect or extra-axial collection. No acute or chronic hemorrhage. There is multifocal hyperintense T2-weighted signal within the white matter. Parenchymal volume and CSF spaces are normal. The midline structures are normal. Vascular: Major flow voids are preserved. Skull and upper cervical spine: Normal calvarium and skull base. Visualized upper cervical spine and soft tissues are normal. Sinuses/Orbits:No paranasal sinus fluid levels or advanced mucosal thickening. No mastoid or middle ear effusion. Normal orbits. IMPRESSION: 1. No acute intracranial  abnormality. 2. Findings of chronic small vessel ischemia. Electronically Signed   By: Deatra Robinson M.D.   On: 01/22/2023 20:20   CT ABDOMEN PELVIS WO CONTRAST  Result Date: 01/22/2023 CLINICAL DATA:  Queried pneumoperitoneum on chest radiograph. Patient presenting with altered mental status in the setting of maxillary sinusitis. EXAM: CT ABDOMEN AND PELVIS WITHOUT CONTRAST TECHNIQUE: Multidetector CT imaging of the abdomen and pelvis was performed following the standard protocol without IV contrast. RADIATION DOSE REDUCTION: This exam was performed according to the departmental dose-optimization program which includes automated exposure control, adjustment of the mA and/or kV according to patient size and/or use of iterative reconstruction technique. COMPARISON:  CT abdomen and pelvis dated 07/21/2017 FINDINGS: Lower chest: Tree-in-bud nodules in the lingula. No pleural effusion or pneumothorax demonstrated. Partially imaged heart size is normal. Coronary artery calcifications. Hepatobiliary: No focal hepatic lesions. No intra or extrahepatic biliary ductal dilation. Cholecystectomy. Pancreas: No focal lesions or main ductal dilation. Spleen: Normal in size without focal abnormality. Adrenals/Urinary Tract: 1.3 cm left adrenal nodule is unchanged from 07/21/2017, likely adenoma. No specific follow-up imaging recommended. Right adrenal nodule. No suspicious renal mass or hydronephrosis. Multiple nonobstructing left renal stones. No focal bladder wall thickening. Stomach/Bowel: Normal appearance of  the stomach. No evidence of bowel wall thickening, distention, or inflammatory changes. Colonic diverticulosis without acute diverticulitis. Normal appendix. Vascular/Lymphatic: Aortic atherosclerosis. No enlarged abdominal or pelvic lymph nodes. Reproductive: No adnexal masses. Other: Elevation of the right hemidiaphragm. No free fluid, fluid collection, or free air. Musculoskeletal: No acute or abnormal lytic or  blastic osseous lesions. Multilevel degenerative changes of the partially imaged thoracic and lumbar spine. Postsurgical changes of the anterior abdominal wall. IMPRESSION: 1. No acute abnormality in the abdomen or pelvis. Specifically, no pneumoperitoneum. Queried finding on chest radiograph is likely due to superimposition of the elevated right hemidiaphragm on normally aerated lung. 2. Tree-in-bud nodules in the lingula, likely infectious or inflammatory. 3. Nonobstructing left renal stones. 4. Colonic diverticulosis without acute diverticulitis. 5. Aortic Atherosclerosis (ICD10-I70.0). Coronary artery calcifications. Assessment for potential risk factor modification, dietary therapy or pharmacologic therapy may be warranted, if clinically indicated. Electronically Signed   By: Agustin Cree M.D.   On: 01/22/2023 18:02   CT Head Wo Contrast  Result Date: 01/22/2023 CLINICAL DATA:  dizziness, ear pain; ear pain, effusion on exam, dizziness. Concern for right-sided mastoiditis. EXAM: CT HEAD AND TEMPORAL BONES WITHOUT CONTRAST TECHNIQUE: Contiguous axial images were obtained from the base of the skull through the vertex without contrast. Multidetector CT imaging of the temporal bones was performed using the standard protocol without intravenous contrast. RADIATION DOSE REDUCTION: This exam was performed according to the departmental dose-optimization program which includes automated exposure control, adjustment of the mA and/or kV according to patient size and/or use of iterative reconstruction technique. COMPARISON:  Head CT 04/02/2022. FINDINGS: CT HEAD FINDINGS Brain: No acute intracranial hemorrhage. Gray-white differentiation is preserved. No hydrocephalus or extra-axial collection. No mass effect or midline shift. Vascular: No hyperdense vessel or unexpected calcification. Skull: No calvarial fracture or suspicious bone lesion. Skull base is unremarkable. Sinuses/Orbits: Unremarkable. Other: None. CT TEMPORAL  BONES FINDINGS External auditory canals are unremarkable. Tympanic membranes are thin. Scutum is sharp bilaterally. Mastoids and middle ear cavities are well aerated. Ossicles are intact. Cochlea, vestibule and semicircular canals are normal bilaterally. Vestibular aqueducts are not enlarged. IAC's are unremarkable. The facial nerves follow a normal course and are well covered with bone. Carotid canals, sigmoid plates and jugular bulbs are unremarkable. IMPRESSION: 1. No acute intracranial abnormality. 2. Unremarkable appearance of the temporal bones. No evidence of mastoiditis. Electronically Signed   By: Orvan Falconer M.D.   On: 01/22/2023 15:46   CT Temporal Bones Wo Contrast  Result Date: 01/22/2023 CLINICAL DATA:  dizziness, ear pain; ear pain, effusion on exam, dizziness. Concern for right-sided mastoiditis. EXAM: CT HEAD AND TEMPORAL BONES WITHOUT CONTRAST TECHNIQUE: Contiguous axial images were obtained from the base of the skull through the vertex without contrast. Multidetector CT imaging of the temporal bones was performed using the standard protocol without intravenous contrast. RADIATION DOSE REDUCTION: This exam was performed according to the departmental dose-optimization program which includes automated exposure control, adjustment of the mA and/or kV according to patient size and/or use of iterative reconstruction technique. COMPARISON:  Head CT 04/02/2022. FINDINGS: CT HEAD FINDINGS Brain: No acute intracranial hemorrhage. Gray-white differentiation is preserved. No hydrocephalus or extra-axial collection. No mass effect or midline shift. Vascular: No hyperdense vessel or unexpected calcification. Skull: No calvarial fracture or suspicious bone lesion. Skull base is unremarkable. Sinuses/Orbits: Unremarkable. Other: None. CT TEMPORAL BONES FINDINGS External auditory canals are unremarkable. Tympanic membranes are thin. Scutum is sharp bilaterally. Mastoids and middle ear cavities are well  aerated. Ossicles are  intact. Cochlea, vestibule and semicircular canals are normal bilaterally. Vestibular aqueducts are not enlarged. IAC's are unremarkable. The facial nerves follow a normal course and are well covered with bone. Carotid canals, sigmoid plates and jugular bulbs are unremarkable. IMPRESSION: 1. No acute intracranial abnormality. 2. Unremarkable appearance of the temporal bones. No evidence of mastoiditis. Electronically Signed   By: Orvan Falconer M.D.   On: 01/22/2023 15:46       This encounter employed real-time, collaborative documentation. The patient actively reviewed and updated their medical record on a shared screen, ensuring transparency and facilitating joint problem-solving for the problem list, overview, and plan. This approach promotes accurate, informed care. The treatment plan was discussed and reviewed in detail, including medication safety, potential side effects, and all patient questions. We confirmed understanding and comfort with the plan. Follow-up instructions were established, including contacting the office for any concerns, returning if symptoms worsen, persist, or new symptoms develop, and precautions for potential emergency department visits. ----------------------------------------------------- Lula Olszewski, MD  03/05/2023 9:08 PM  [facility]:  [phone]

## 2023-03-05 NOTE — Assessment & Plan Note (Signed)
  Pressure Injury: She had a recent pressure injury from an ICU stay, now resolved. No further action is needed.

## 2023-03-06 LAB — COMPREHENSIVE METABOLIC PANEL
ALT: 13 U/L (ref 0–35)
AST: 21 U/L (ref 0–37)
Albumin: 4.1 g/dL (ref 3.5–5.2)
Alkaline Phosphatase: 57 U/L (ref 39–117)
BUN: 11 mg/dL (ref 6–23)
CO2: 25 mEq/L (ref 19–32)
Calcium: 10.3 mg/dL (ref 8.4–10.5)
Chloride: 104 mEq/L (ref 96–112)
Creatinine, Ser: 0.77 mg/dL (ref 0.40–1.20)
GFR: 71.33 mL/min (ref >60.00)
Glucose, Bld: 115 mg/dL — ABNORMAL HIGH (ref 70–99)
Potassium: 3.7 mEq/L (ref 3.5–5.1)
Sodium: 139 mEq/L (ref 135–145)
Total Bilirubin: 0.7 mg/dL (ref 0.2–1.2)
Total Protein: 7.1 g/dL (ref 6.0–8.3)

## 2023-03-06 LAB — CBC WITH DIFFERENTIAL/PLATELET
Basophils Absolute: 0.1 10*3/uL (ref 0.0–0.1)
Basophils Relative: 1 % (ref 0.0–3.0)
Eosinophils Absolute: 0.2 10*3/uL (ref 0.0–0.7)
Eosinophils Relative: 3.2 % (ref 0.0–5.0)
HCT: 36.7 % (ref 36.0–46.0)
Hemoglobin: 11.9 g/dL — ABNORMAL LOW (ref 12.0–15.0)
Lymphocytes Relative: 16.4 % (ref 12.0–46.0)
Lymphs Abs: 1 10*3/uL (ref 0.7–4.0)
MCHC: 32.5 g/dL (ref 30.0–36.0)
MCV: 90.3 fl (ref 78.0–100.0)
Monocytes Absolute: 0.6 10*3/uL (ref 0.1–1.0)
Monocytes Relative: 9.5 % (ref 3.0–12.0)
Neutro Abs: 4.3 10*3/uL (ref 1.4–7.7)
Neutrophils Relative %: 69.9 % (ref 43.0–77.0)
Platelets: 259 10*3/uL (ref 150.0–400.0)
RBC: 4.07 Mil/uL (ref 3.87–5.11)
RDW: 14.9 % (ref 11.5–15.5)
WBC: 6.1 10*3/uL (ref 4.0–10.5)

## 2023-03-06 LAB — MAGNESIUM: Magnesium: 1.7 mg/dL (ref 1.5–2.5)

## 2023-03-06 LAB — INSULIN AND C-PEPTIDE, SERUM
C-Peptide: 2.7 ng/mL (ref 1.1–4.4)
INSULIN: 8.1 u[IU]/mL (ref 2.6–24.9)

## 2023-03-07 DIAGNOSIS — I152 Hypertension secondary to endocrine disorders: Secondary | ICD-10-CM | POA: Diagnosis not present

## 2023-03-07 DIAGNOSIS — J45909 Unspecified asthma, uncomplicated: Secondary | ICD-10-CM | POA: Diagnosis not present

## 2023-03-07 DIAGNOSIS — E1159 Type 2 diabetes mellitus with other circulatory complications: Secondary | ICD-10-CM | POA: Diagnosis not present

## 2023-03-07 DIAGNOSIS — D1724 Benign lipomatous neoplasm of skin and subcutaneous tissue of left leg: Secondary | ICD-10-CM | POA: Diagnosis not present

## 2023-03-07 DIAGNOSIS — E1165 Type 2 diabetes mellitus with hyperglycemia: Secondary | ICD-10-CM | POA: Diagnosis not present

## 2023-03-07 DIAGNOSIS — E876 Hypokalemia: Secondary | ICD-10-CM | POA: Diagnosis not present

## 2023-03-07 DIAGNOSIS — K625 Hemorrhage of anus and rectum: Secondary | ICD-10-CM | POA: Diagnosis not present

## 2023-03-07 DIAGNOSIS — I4719 Other supraventricular tachycardia: Secondary | ICD-10-CM | POA: Diagnosis not present

## 2023-03-07 DIAGNOSIS — D3500 Benign neoplasm of unspecified adrenal gland: Secondary | ICD-10-CM | POA: Diagnosis not present

## 2023-03-08 DIAGNOSIS — K5900 Constipation, unspecified: Secondary | ICD-10-CM | POA: Diagnosis not present

## 2023-03-08 DIAGNOSIS — K219 Gastro-esophageal reflux disease without esophagitis: Secondary | ICD-10-CM | POA: Diagnosis not present

## 2023-03-08 DIAGNOSIS — K5730 Diverticulosis of large intestine without perforation or abscess without bleeding: Secondary | ICD-10-CM | POA: Diagnosis not present

## 2023-03-10 LAB — GLUTAMIC ACID DECARBOXYLASE AUTO ABS: Glutamic Acid Decarb Ab: <5 IU/mL (ref <5)

## 2023-03-10 LAB — BETA-HYDROXYBUTYRATE: Beta-Hydroxybutyric Acid: 0.77 mmol/L — ABNORMAL HIGH

## 2023-03-11 NOTE — Progress Notes (Signed)
Subject: More lab results and next steps in your care  I hope this message finds you feeling better. I've reviewed your latest lab results from after your hospital stay, and I'd like to share some important points:  - Overall improvement:   Your lab results show significant improvement since your hospitalization, which is very encouraging.  - Blood sugar:   Your glucose level has improved dramatically, now only slightly elevated at 115.  - Electrolytes:   Most are within normal range, with potassium slightly low at 3.7.   Consider incorporating more potassium-rich foods in your diet.  - Anemia:   Your hemoglobin is still slightly low at 11.9. We'll continue to monitor this.  - Kidney function:   Your GFR is at 71.33, indicating good kidney function. Its been stable at this level for last few years.      Component                Value               Date/Time                 GFR                      71.33               03/05/2023       GFR                      79.13               04/04/2022       GFR                      74.38               09/13/2021       GFR                      75.76               05/18/2021       GFR                      67.12               02/02/2021   - Beta-Hydroxybutyric Acid:   Slightly elevated at 0.77, but much improved from your hospital stay.  - Diabetes-related tests:   C-Peptide and Insulin levels are present, which gives [place] valuable information about your insulin production.  You are able to make your own insulin according to this data.  Antibodies to your pancreas (anti-GAD) were not seen.  Moving forward, I believe it's crucial to continue to reassess your diabetes management plan. I'd like to schedule you for additional testing to determine the exact type of diabetes you have, as this will guide our treatment strategy.  In the meantime: 1. Continue monitoring your blood sugar levels closely, and bring that with you to your endocrine appointment  and/or appointments with me. 2. Follow the insulin regimen you've been on and let me know if you need any refills or supplies while we wait on endocrine 3. Keep a log of any symptoms you experience, particularly dizziness or unusual fatigue  I want to assure you that I'm committed to providing you with the best possible care. Your trust in me as your physician is  something I deeply value and do not take for granted. I appreciate your patience as we work together to manage your health effectively.  If you have any concerns or experience any worrying symptoms, please don't hesitate to contact our office immediately.  Let's schedule an appointment soon to discuss these results in detail and plan our next steps. Thank you for continuing to trust me with your care.  Glenetta Hew, MD/Ph.D [facility] [phone]

## 2023-03-12 ENCOUNTER — Other Ambulatory Visit: Payer: Self-pay | Admitting: Internal Medicine

## 2023-03-12 DIAGNOSIS — D1724 Benign lipomatous neoplasm of skin and subcutaneous tissue of left leg: Secondary | ICD-10-CM | POA: Diagnosis not present

## 2023-03-12 DIAGNOSIS — E1159 Type 2 diabetes mellitus with other circulatory complications: Secondary | ICD-10-CM | POA: Diagnosis not present

## 2023-03-12 DIAGNOSIS — I4719 Other supraventricular tachycardia: Secondary | ICD-10-CM | POA: Diagnosis not present

## 2023-03-12 DIAGNOSIS — E1165 Type 2 diabetes mellitus with hyperglycemia: Secondary | ICD-10-CM | POA: Diagnosis not present

## 2023-03-12 DIAGNOSIS — E876 Hypokalemia: Secondary | ICD-10-CM | POA: Diagnosis not present

## 2023-03-12 DIAGNOSIS — K625 Hemorrhage of anus and rectum: Secondary | ICD-10-CM | POA: Diagnosis not present

## 2023-03-12 DIAGNOSIS — J45909 Unspecified asthma, uncomplicated: Secondary | ICD-10-CM | POA: Diagnosis not present

## 2023-03-12 DIAGNOSIS — D3500 Benign neoplasm of unspecified adrenal gland: Secondary | ICD-10-CM | POA: Diagnosis not present

## 2023-03-12 DIAGNOSIS — I152 Hypertension secondary to endocrine disorders: Secondary | ICD-10-CM | POA: Diagnosis not present

## 2023-03-13 ENCOUNTER — Telehealth: Payer: Self-pay | Admitting: Internal Medicine

## 2023-03-13 ENCOUNTER — Other Ambulatory Visit: Payer: Self-pay

## 2023-03-13 DIAGNOSIS — E108 Type 1 diabetes mellitus with unspecified complications: Secondary | ICD-10-CM

## 2023-03-13 NOTE — Telephone Encounter (Signed)
Prescription Request  03/13/2023  LOV: 03/05/2023  What is the name of the medication or equipment?  Accucheck test strips  Have you contacted your pharmacy to request a refill? No   Which pharmacy would you like this sent to? [facility] [address] [place] - [address] [place] [address] Phone: [phone] Fax: [phone]    Patient notified that their request is being sent to the clinical staff for review and that they should receive a response within 2 business days.   Please advise at Mobile [phone] (mobile)

## 2023-03-15 ENCOUNTER — Other Ambulatory Visit: Payer: Self-pay | Admitting: *Deleted

## 2023-03-15 DIAGNOSIS — E119 Type 2 diabetes mellitus without complications: Secondary | ICD-10-CM

## 2023-03-15 DIAGNOSIS — Z7984 Long term (current) use of oral hypoglycemic drugs: Secondary | ICD-10-CM

## 2023-03-15 MED ORDER — BLOOD GLUCOSE TEST VI STRP
1.0000 | ORAL_STRIP | Freq: Three times a day (TID) | 0 refills | Status: AC
Start: 2023-03-15 — End: 2023-04-14

## 2023-03-15 NOTE — Telephone Encounter (Signed)
Rx send to pharmacy  

## 2023-03-16 ENCOUNTER — Telehealth: Payer: Self-pay | Admitting: Internal Medicine

## 2023-03-16 DIAGNOSIS — J45909 Unspecified asthma, uncomplicated: Secondary | ICD-10-CM | POA: Diagnosis not present

## 2023-03-16 DIAGNOSIS — E876 Hypokalemia: Secondary | ICD-10-CM | POA: Diagnosis not present

## 2023-03-16 DIAGNOSIS — K625 Hemorrhage of anus and rectum: Secondary | ICD-10-CM | POA: Diagnosis not present

## 2023-03-16 DIAGNOSIS — E1165 Type 2 diabetes mellitus with hyperglycemia: Secondary | ICD-10-CM | POA: Diagnosis not present

## 2023-03-16 DIAGNOSIS — D1724 Benign lipomatous neoplasm of skin and subcutaneous tissue of left leg: Secondary | ICD-10-CM | POA: Diagnosis not present

## 2023-03-16 DIAGNOSIS — I152 Hypertension secondary to endocrine disorders: Secondary | ICD-10-CM | POA: Diagnosis not present

## 2023-03-16 DIAGNOSIS — D3500 Benign neoplasm of unspecified adrenal gland: Secondary | ICD-10-CM | POA: Diagnosis not present

## 2023-03-16 DIAGNOSIS — E1159 Type 2 diabetes mellitus with other circulatory complications: Secondary | ICD-10-CM | POA: Diagnosis not present

## 2023-03-16 DIAGNOSIS — I4719 Other supraventricular tachycardia: Secondary | ICD-10-CM | POA: Diagnosis not present

## 2023-03-16 NOTE — Telephone Encounter (Signed)
Pt states she is having really bad constipation, I let her know she would need to come in for an appt to get new RX but she did not want to. She states she wants an RX for Linzess. Please advise.

## 2023-03-19 DIAGNOSIS — Z794 Long term (current) use of insulin: Secondary | ICD-10-CM | POA: Diagnosis not present

## 2023-03-19 DIAGNOSIS — E119 Type 2 diabetes mellitus without complications: Secondary | ICD-10-CM | POA: Diagnosis not present

## 2023-03-19 DIAGNOSIS — E1165 Type 2 diabetes mellitus with hyperglycemia: Secondary | ICD-10-CM | POA: Diagnosis not present

## 2023-03-20 ENCOUNTER — Other Ambulatory Visit: Payer: Self-pay | Admitting: Internal Medicine

## 2023-03-20 DIAGNOSIS — E1165 Type 2 diabetes mellitus with hyperglycemia: Secondary | ICD-10-CM | POA: Diagnosis not present

## 2023-03-20 DIAGNOSIS — I4719 Other supraventricular tachycardia: Secondary | ICD-10-CM | POA: Diagnosis not present

## 2023-03-20 DIAGNOSIS — K625 Hemorrhage of anus and rectum: Secondary | ICD-10-CM | POA: Diagnosis not present

## 2023-03-20 DIAGNOSIS — E1159 Type 2 diabetes mellitus with other circulatory complications: Secondary | ICD-10-CM | POA: Diagnosis not present

## 2023-03-20 DIAGNOSIS — I152 Hypertension secondary to endocrine disorders: Secondary | ICD-10-CM | POA: Diagnosis not present

## 2023-03-20 DIAGNOSIS — E876 Hypokalemia: Secondary | ICD-10-CM | POA: Diagnosis not present

## 2023-03-20 DIAGNOSIS — J45909 Unspecified asthma, uncomplicated: Secondary | ICD-10-CM | POA: Diagnosis not present

## 2023-03-20 DIAGNOSIS — K5900 Constipation, unspecified: Secondary | ICD-10-CM

## 2023-03-20 DIAGNOSIS — D1724 Benign lipomatous neoplasm of skin and subcutaneous tissue of left leg: Secondary | ICD-10-CM | POA: Diagnosis not present

## 2023-03-20 DIAGNOSIS — D3500 Benign neoplasm of unspecified adrenal gland: Secondary | ICD-10-CM | POA: Diagnosis not present

## 2023-03-20 MED ORDER — LINACLOTIDE 145 MCG PO CAPS
145.0000 ug | ORAL_CAPSULE | Freq: Every day | ORAL | 3 refills | Status: AC
Start: 2023-03-20 — End: ?

## 2023-03-22 DIAGNOSIS — E1165 Type 2 diabetes mellitus with hyperglycemia: Secondary | ICD-10-CM | POA: Diagnosis not present

## 2023-03-22 DIAGNOSIS — D1724 Benign lipomatous neoplasm of skin and subcutaneous tissue of left leg: Secondary | ICD-10-CM | POA: Diagnosis not present

## 2023-03-22 DIAGNOSIS — I152 Hypertension secondary to endocrine disorders: Secondary | ICD-10-CM | POA: Diagnosis not present

## 2023-03-22 DIAGNOSIS — K625 Hemorrhage of anus and rectum: Secondary | ICD-10-CM | POA: Diagnosis not present

## 2023-03-22 DIAGNOSIS — I4719 Other supraventricular tachycardia: Secondary | ICD-10-CM | POA: Diagnosis not present

## 2023-03-22 DIAGNOSIS — J45909 Unspecified asthma, uncomplicated: Secondary | ICD-10-CM | POA: Diagnosis not present

## 2023-03-22 DIAGNOSIS — D3500 Benign neoplasm of unspecified adrenal gland: Secondary | ICD-10-CM | POA: Diagnosis not present

## 2023-03-22 DIAGNOSIS — E876 Hypokalemia: Secondary | ICD-10-CM | POA: Diagnosis not present

## 2023-03-22 DIAGNOSIS — E1159 Type 2 diabetes mellitus with other circulatory complications: Secondary | ICD-10-CM | POA: Diagnosis not present

## 2023-03-26 ENCOUNTER — Telehealth: Payer: Self-pay | Admitting: Internal Medicine

## 2023-03-26 NOTE — Telephone Encounter (Signed)
Patient states she never received the [facility] 3 system that goes in her arm and check it with her phone, so that she does not have prick her finger.  Patient requests RX for [facility] 3 system be sent to:   [facility] [address] [place] - [address] Phone: [phone]  Fax: [phone]

## 2023-03-27 ENCOUNTER — Ambulatory Visit: Payer: Self-pay

## 2023-03-27 NOTE — Patient Outreach (Signed)
  Care Coordination   Follow Up Visit Note   03/27/2023 Name: Sabrina Aguirre MRN: [id number] DOB: [birth year]  Sabrina Aguirre is a 82 y.o. year old female who sees Lula Olszewski, MD for primary care. I spoke with  [name] by phone today.  What matters to the patients health and wellness today?  Diabetes Management    Goals Addressed             This Visit's Progress    Diabetes Management       Patient Goals/Self Care Activities: -Patient/Caregiver will self-administer medications as prescribed as evidenced by self-report/primary caregiver report  -Patient/Caregiver will attend all scheduled provider appointments as evidenced by clinician review of documented attendance to scheduled appointments and patient/caregiver report -Patient/Caregiver will call provider office for new concerns or questions as evidenced by review of documented incoming telephone call notes and patient report  -check blood sugar at prescribed times -check blood sugar if I feel it is too high or too low -record values and write them down take them to all doctor visits  -check feet daily for cuts, sores or redness    Patient with new diabetes.   Reiterated  diabetes management and importance.  BS 111 this am. Discussed eating at regularly scheduled times. She verbalized understanding. No concerns.         SDOH assessments and interventions completed:  Yes     Care Coordination Interventions:  Yes, provided   Follow up plan: Follow up call scheduled for September    Encounter Outcome:  Pt. Visit Completed   Bary Leriche, RN, MSN [facility] Care Management Care Management Coordinator Direct Line [phone]

## 2023-03-27 NOTE — Telephone Encounter (Signed)
Informed patient of this advice.

## 2023-03-27 NOTE — Patient Instructions (Signed)
Visit Information  Thank you for taking time to visit with me today. Please don't hesitate to contact me if I can be of assistance to you.   Following are the goals we discussed today:   Goals Addressed             This Visit's Progress    Diabetes Management       Patient Goals/Self Care Activities: -Patient/Caregiver will self-administer medications as prescribed as evidenced by self-report/primary caregiver report  -Patient/Caregiver will attend all scheduled provider appointments as evidenced by clinician review of documented attendance to scheduled appointments and patient/caregiver report -Patient/Caregiver will call provider office for new concerns or questions as evidenced by review of documented incoming telephone call notes and patient report  -check blood sugar at prescribed times -check blood sugar if I feel it is too high or too low -record values and write them down take them to all doctor visits  -check feet daily for cuts, sores or redness    Patient with new diabetes.   Reiterated  diabetes management and importance.  BS 111 this am. Discussed eating at regularly scheduled times. She verbalized understanding. No concerns.         Our next appointment is by telephone on 04/24/23 at 1000 am  Please call the care guide team at [phone] if you need to cancel or reschedule your appointment.   If you are experiencing a Mental Health or Behavioral Health Crisis or need someone to talk to, please call the Suicide and Crisis Lifeline: 988   Patient verbalizes understanding of instructions and care plan provided today and agrees to view in MyChart. Active MyChart status and patient understanding of how to access instructions and care plan via MyChart confirmed with patient.     The patient has been provided with contact information for the care management team and has been advised to call with any health related questions or concerns.   Bary Leriche, RN, MSN [facility]  Care Management Care Management Coordinator Direct Line [phone]

## 2023-03-30 ENCOUNTER — Other Ambulatory Visit: Payer: Self-pay | Admitting: *Deleted

## 2023-03-30 DIAGNOSIS — E108 Type 1 diabetes mellitus with unspecified complications: Secondary | ICD-10-CM

## 2023-03-30 MED ORDER — FREESTYLE LIBRE 3 SENSOR MISC
1.0000 | Freq: Every day | 0 refills | Status: DC
Start: 2023-03-30 — End: 2023-05-17

## 2023-03-30 NOTE — Telephone Encounter (Signed)
Rx was resend to [facility]

## 2023-04-05 DIAGNOSIS — E1159 Type 2 diabetes mellitus with other circulatory complications: Secondary | ICD-10-CM | POA: Diagnosis not present

## 2023-04-05 DIAGNOSIS — E1165 Type 2 diabetes mellitus with hyperglycemia: Secondary | ICD-10-CM | POA: Diagnosis not present

## 2023-04-05 DIAGNOSIS — E876 Hypokalemia: Secondary | ICD-10-CM | POA: Diagnosis not present

## 2023-04-05 DIAGNOSIS — I4719 Other supraventricular tachycardia: Secondary | ICD-10-CM | POA: Diagnosis not present

## 2023-04-05 DIAGNOSIS — D3500 Benign neoplasm of unspecified adrenal gland: Secondary | ICD-10-CM | POA: Diagnosis not present

## 2023-04-05 DIAGNOSIS — J45909 Unspecified asthma, uncomplicated: Secondary | ICD-10-CM | POA: Diagnosis not present

## 2023-04-05 DIAGNOSIS — K625 Hemorrhage of anus and rectum: Secondary | ICD-10-CM | POA: Diagnosis not present

## 2023-04-05 DIAGNOSIS — D1724 Benign lipomatous neoplasm of skin and subcutaneous tissue of left leg: Secondary | ICD-10-CM | POA: Diagnosis not present

## 2023-04-05 DIAGNOSIS — I152 Hypertension secondary to endocrine disorders: Secondary | ICD-10-CM | POA: Diagnosis not present

## 2023-04-06 DIAGNOSIS — J45909 Unspecified asthma, uncomplicated: Secondary | ICD-10-CM | POA: Diagnosis not present

## 2023-04-06 DIAGNOSIS — D1724 Benign lipomatous neoplasm of skin and subcutaneous tissue of left leg: Secondary | ICD-10-CM | POA: Diagnosis not present

## 2023-04-06 DIAGNOSIS — I152 Hypertension secondary to endocrine disorders: Secondary | ICD-10-CM | POA: Diagnosis not present

## 2023-04-06 DIAGNOSIS — E1159 Type 2 diabetes mellitus with other circulatory complications: Secondary | ICD-10-CM | POA: Diagnosis not present

## 2023-04-06 DIAGNOSIS — E876 Hypokalemia: Secondary | ICD-10-CM | POA: Diagnosis not present

## 2023-04-06 DIAGNOSIS — K625 Hemorrhage of anus and rectum: Secondary | ICD-10-CM | POA: Diagnosis not present

## 2023-04-06 DIAGNOSIS — E1165 Type 2 diabetes mellitus with hyperglycemia: Secondary | ICD-10-CM | POA: Diagnosis not present

## 2023-04-06 DIAGNOSIS — I4719 Other supraventricular tachycardia: Secondary | ICD-10-CM | POA: Diagnosis not present

## 2023-04-06 DIAGNOSIS — D3500 Benign neoplasm of unspecified adrenal gland: Secondary | ICD-10-CM | POA: Diagnosis not present

## 2023-04-12 DIAGNOSIS — E119 Type 2 diabetes mellitus without complications: Secondary | ICD-10-CM | POA: Diagnosis not present

## 2023-04-13 DIAGNOSIS — D1724 Benign lipomatous neoplasm of skin and subcutaneous tissue of left leg: Secondary | ICD-10-CM | POA: Diagnosis not present

## 2023-04-13 DIAGNOSIS — K625 Hemorrhage of anus and rectum: Secondary | ICD-10-CM | POA: Diagnosis not present

## 2023-04-13 DIAGNOSIS — I4719 Other supraventricular tachycardia: Secondary | ICD-10-CM | POA: Diagnosis not present

## 2023-04-13 DIAGNOSIS — I152 Hypertension secondary to endocrine disorders: Secondary | ICD-10-CM | POA: Diagnosis not present

## 2023-04-13 DIAGNOSIS — D3500 Benign neoplasm of unspecified adrenal gland: Secondary | ICD-10-CM | POA: Diagnosis not present

## 2023-04-13 DIAGNOSIS — E1159 Type 2 diabetes mellitus with other circulatory complications: Secondary | ICD-10-CM | POA: Diagnosis not present

## 2023-04-13 DIAGNOSIS — E876 Hypokalemia: Secondary | ICD-10-CM | POA: Diagnosis not present

## 2023-04-13 DIAGNOSIS — E1165 Type 2 diabetes mellitus with hyperglycemia: Secondary | ICD-10-CM | POA: Diagnosis not present

## 2023-04-13 DIAGNOSIS — J45909 Unspecified asthma, uncomplicated: Secondary | ICD-10-CM | POA: Diagnosis not present

## 2023-04-18 ENCOUNTER — Other Ambulatory Visit: Payer: Self-pay | Admitting: Family Medicine

## 2023-04-24 ENCOUNTER — Ambulatory Visit: Payer: Self-pay

## 2023-04-24 NOTE — Patient Outreach (Signed)
  Care Coordination   Follow Up Visit Note   04/24/2023 Name: Sabrina Aguirre MRN: [id number] DOB: 1941-01-29  Sabrina Aguirre is a 82 y.o. year old female who sees Lula Olszewski, MD for primary care. I spoke with  [name] by phone today.  What matters to the patients health and wellness today?  Diabetes management    Goals Addressed             This Visit's Progress    Diabetes Management       Patient Goals/Self Care Activities: -Patient/Caregiver will self-administer medications as prescribed as evidenced by self-report/primary caregiver report  -Patient/Caregiver will attend all scheduled provider appointments as evidenced by clinician review of documented attendance to scheduled appointments and patient/caregiver report -Patient/Caregiver will call provider office for new concerns or questions as evidenced by review of documented incoming telephone call notes and patient report  -check blood sugar at prescribed times -check blood sugar if I feel it is too high or too low -record values and write them down take them to all doctor visits  -check feet daily for cuts, sores or redness    Patient doing pretty good.  Managing diabetes well.  Sugar this AM 122.  Reinforced  diabetes management and importance.  Discussed foot care and podiatry for nail trimming.  She verbalized understanding. No concerns.         SDOH assessments and interventions completed:  Yes     Care Coordination Interventions:  Yes, provided   Follow up plan: Follow up call scheduled for October    Encounter Outcome:  Pt. Visit Completed   Bary Leriche, RN, MSN [place]  [facility], [facility] Management Community Coordinator Direct Dial: [phone]  Fax: [phone] Website: [email or web address]

## 2023-04-24 NOTE — Patient Instructions (Signed)
Visit Information  Thank you for taking time to visit with me today. Please don't hesitate to contact me if I can be of assistance to you.   Following are the goals we discussed today:   Goals Addressed             This Visit's Progress    Diabetes Management       Patient Goals/Self Care Activities: -Patient/Caregiver will self-administer medications as prescribed as evidenced by self-report/primary caregiver report  -Patient/Caregiver will attend all scheduled provider appointments as evidenced by clinician review of documented attendance to scheduled appointments and patient/caregiver report -Patient/Caregiver will call provider office for new concerns or questions as evidenced by review of documented incoming telephone call notes and patient report  -check blood sugar at prescribed times -check blood sugar if I feel it is too high or too low -record values and write them down take them to all doctor visits  -check feet daily for cuts, sores or redness    Patient doing pretty good.  Managing diabetes well.  Sugar this AM 122.  Reinforced  diabetes management and importance.  Discussed foot care and podiatry for nail trimming.  She verbalized understanding. No concerns.         Our next appointment is by telephone on 05/22/23 at 1000 am  Please call the care guide team at [phone] if you need to cancel or reschedule your appointment.   If you are experiencing a Mental Health or Behavioral Health Crisis or need someone to talk to, please call the Suicide and Crisis Lifeline: 988   Patient verbalizes understanding of instructions and care plan provided today and agrees to view in MyChart. Active MyChart status and patient understanding of how to access instructions and care plan via MyChart confirmed with patient.     The patient has been provided with contact information for the care management team and has been advised to call with any health related questions or concerns.    Bary Leriche, RN, MSN [facility], [facility] Management Community Coordinator Direct Dial: [phone]  Fax: [phone] Website: [email or web address]

## 2023-05-09 DIAGNOSIS — H02422 Myogenic ptosis of left eyelid: Secondary | ICD-10-CM | POA: Diagnosis not present

## 2023-05-09 DIAGNOSIS — H16223 Keratoconjunctivitis sicca, not specified as Sjogren's, bilateral: Secondary | ICD-10-CM | POA: Diagnosis not present

## 2023-05-09 DIAGNOSIS — H43812 Vitreous degeneration, left eye: Secondary | ICD-10-CM | POA: Diagnosis not present

## 2023-05-09 DIAGNOSIS — Z961 Presence of intraocular lens: Secondary | ICD-10-CM | POA: Diagnosis not present

## 2023-05-09 DIAGNOSIS — H401131 Primary open-angle glaucoma, bilateral, mild stage: Secondary | ICD-10-CM | POA: Diagnosis not present

## 2023-05-09 DIAGNOSIS — G43B0 Ophthalmoplegic migraine, not intractable: Secondary | ICD-10-CM | POA: Diagnosis not present

## 2023-05-09 DIAGNOSIS — H26493 Other secondary cataract, bilateral: Secondary | ICD-10-CM | POA: Diagnosis not present

## 2023-05-09 DIAGNOSIS — E119 Type 2 diabetes mellitus without complications: Secondary | ICD-10-CM | POA: Diagnosis not present

## 2023-05-16 ENCOUNTER — Other Ambulatory Visit: Payer: Self-pay | Admitting: Internal Medicine

## 2023-05-16 DIAGNOSIS — I10 Essential (primary) hypertension: Secondary | ICD-10-CM | POA: Diagnosis not present

## 2023-05-16 DIAGNOSIS — N182 Chronic kidney disease, stage 2 (mild): Secondary | ICD-10-CM | POA: Diagnosis not present

## 2023-05-16 DIAGNOSIS — D649 Anemia, unspecified: Secondary | ICD-10-CM | POA: Diagnosis not present

## 2023-05-16 DIAGNOSIS — E1165 Type 2 diabetes mellitus with hyperglycemia: Secondary | ICD-10-CM | POA: Diagnosis not present

## 2023-05-16 DIAGNOSIS — E1122 Type 2 diabetes mellitus with diabetic chronic kidney disease: Secondary | ICD-10-CM | POA: Diagnosis not present

## 2023-05-16 DIAGNOSIS — E119 Type 2 diabetes mellitus without complications: Secondary | ICD-10-CM | POA: Diagnosis not present

## 2023-05-16 DIAGNOSIS — E108 Type 1 diabetes mellitus with unspecified complications: Secondary | ICD-10-CM

## 2023-05-16 DIAGNOSIS — Z9081 Acquired absence of spleen: Secondary | ICD-10-CM | POA: Diagnosis not present

## 2023-05-16 DIAGNOSIS — Z794 Long term (current) use of insulin: Secondary | ICD-10-CM | POA: Diagnosis not present

## 2023-05-16 DIAGNOSIS — G43909 Migraine, unspecified, not intractable, without status migrainosus: Secondary | ICD-10-CM | POA: Diagnosis not present

## 2023-05-16 DIAGNOSIS — I4719 Other supraventricular tachycardia: Secondary | ICD-10-CM | POA: Diagnosis not present

## 2023-05-16 NOTE — Telephone Encounter (Signed)
Sent my chart message to verify if patient wants this sent to [facility] pharmacy or [facility] mail delivery pharmacy. Called and vm is not set up. She should have enough refills.

## 2023-05-19 ENCOUNTER — Other Ambulatory Visit: Payer: Self-pay | Admitting: Family Medicine

## 2023-05-22 ENCOUNTER — Ambulatory Visit: Payer: Self-pay

## 2023-05-22 NOTE — Patient Instructions (Signed)
Visit Information  Thank you for taking time to visit with me today. Please don't hesitate to contact me if I can be of assistance to you.   Following are the goals we discussed today:   Goals Addressed             This Visit's Progress    Diabetes Management       Patient Goals/Self Care Activities: -Patient/Caregiver will self-administer medications as prescribed as evidenced by self-report/primary caregiver report  -Patient/Caregiver will attend all scheduled provider appointments as evidenced by clinician review of documented attendance to scheduled appointments and patient/caregiver report -Patient/Caregiver will call provider office for new concerns or questions as evidenced by review of documented incoming telephone call notes and patient report  -check blood sugar at prescribed times -check blood sugar if I feel it is too high or too low -record values and write them down take them to all doctor visits  -check feet daily for cuts, sores or redness    Patient doing good.  She reports that her diabetes is doing excellent.  Sugar this AM 118.  Recently saw eye MD for annual appointment.  Encouraged to continue diabetes management.  She verbalized understanding. No concerns.         Our next appointment is by telephone on 06/26/23 at 1000 am  Please call the care guide team at [phone] if you need to cancel or reschedule your appointment.   If you are experiencing a Mental Health or Behavioral Health Crisis or need someone to talk to, please call the Suicide and Crisis Lifeline: 988   Patient verbalizes understanding of instructions and care plan provided today and agrees to view in MyChart. Active MyChart status and patient understanding of how to access instructions and care plan via MyChart confirmed with patient.     The patient has been provided with contact information for the care management team and has been advised to call with any health related questions or  concerns.   Bary Leriche, RN, MSN [facility], [facility] Management Community Coordinator Direct Dial: [phone]  Fax: [phone] Website: [email or web address]

## 2023-05-30 ENCOUNTER — Other Ambulatory Visit: Payer: Self-pay | Admitting: Internal Medicine

## 2023-05-30 DIAGNOSIS — Z1231 Encounter for screening mammogram for malignant neoplasm of breast: Secondary | ICD-10-CM

## 2023-06-15 DIAGNOSIS — E1165 Type 2 diabetes mellitus with hyperglycemia: Secondary | ICD-10-CM | POA: Diagnosis not present

## 2023-06-15 DIAGNOSIS — Z794 Long term (current) use of insulin: Secondary | ICD-10-CM | POA: Diagnosis not present

## 2023-06-15 DIAGNOSIS — Z0000 Encounter for general adult medical examination without abnormal findings: Secondary | ICD-10-CM | POA: Diagnosis not present

## 2023-06-15 DIAGNOSIS — I10 Essential (primary) hypertension: Secondary | ICD-10-CM | POA: Diagnosis not present

## 2023-06-19 DIAGNOSIS — E1129 Type 2 diabetes mellitus with other diabetic kidney complication: Secondary | ICD-10-CM | POA: Diagnosis not present

## 2023-06-19 DIAGNOSIS — Z978 Presence of other specified devices: Secondary | ICD-10-CM | POA: Diagnosis not present

## 2023-06-19 DIAGNOSIS — I10 Essential (primary) hypertension: Secondary | ICD-10-CM | POA: Diagnosis not present

## 2023-06-19 DIAGNOSIS — Z794 Long term (current) use of insulin: Secondary | ICD-10-CM | POA: Diagnosis not present

## 2023-06-19 DIAGNOSIS — E119 Type 2 diabetes mellitus without complications: Secondary | ICD-10-CM | POA: Diagnosis not present

## 2023-06-19 DIAGNOSIS — R809 Proteinuria, unspecified: Secondary | ICD-10-CM | POA: Diagnosis not present

## 2023-06-26 ENCOUNTER — Ambulatory Visit: Payer: Self-pay

## 2023-06-26 NOTE — Patient Instructions (Signed)
Visit Information  Thank you for taking time to visit with me today. Please don't hesitate to contact me if I can be of assistance to you.   Following are the goals we discussed today:   Goals Addressed             This Visit's Progress    Diabetes Management       Patient Goals/Self Care Activities: -Patient/Caregiver will self-administer medications as prescribed as evidenced by self-report/primary caregiver report  -Patient/Caregiver will attend all scheduled provider appointments as evidenced by clinician review of documented attendance to scheduled appointments and patient/caregiver report -Patient/Caregiver will call provider office for new concerns or questions as evidenced by review of documented incoming telephone call notes and patient report  -check blood sugar at prescribed times -check blood sugar if I feel it is too high or too low -record values and write them down take them to all doctor visits  -check feet daily for cuts, sores or redness    Patient doing great.  She reports that her diabetes is doing excellent A1c now 5.9.  Sugar this AM 120.  Insulin reduced.  Reiterated diabetes management.  She verbalized understanding. She is moving in with her daughter in [place] this month.  No concerns.         Our next appointment is by telephone on 08/28/23 at 1000 am  Please call the care guide team at [phone] if you need to cancel or reschedule your appointment.   If you are experiencing a Mental Health or Behavioral Health Crisis or need someone to talk to, please call the Suicide and Crisis Lifeline: 988   Patient verbalizes understanding of instructions and care plan provided today and agrees to view in MyChart. Active MyChart status and patient understanding of how to access instructions and care plan via MyChart confirmed with patient.     The patient has been provided with contact information for the care management team and has been advised to call with  any health related questions or concerns.   Bary Leriche, RN, MSN [facility], [facility] Management Community Coordinator Direct Dial: [phone]  Fax: [phone] Website: [email or web address]

## 2023-06-26 NOTE — Patient Outreach (Signed)
  Care Coordination   Follow Up Visit Note   06/26/2023 Name: Sabrina Aguirre MRN: [id number] DOB: [birth year]  Sabrina Aguirre is a 82 y.o. year old female who sees Lula Olszewski, MD for primary care. I spoke with  [name] by phone today.  What matters to the patients health and wellness today?  Diabetes management    Goals Addressed             This Visit's Progress    Diabetes Management       Patient Goals/Self Care Activities: -Patient/Caregiver will self-administer medications as prescribed as evidenced by self-report/primary caregiver report  -Patient/Caregiver will attend all scheduled provider appointments as evidenced by clinician review of documented attendance to scheduled appointments and patient/caregiver report -Patient/Caregiver will call provider office for new concerns or questions as evidenced by review of documented incoming telephone call notes and patient report  -check blood sugar at prescribed times -check blood sugar if I feel it is too high or too low -record values and write them down take them to all doctor visits  -check feet daily for cuts, sores or redness    Patient doing great.  She reports that her diabetes is doing excellent A1c now 5.9.  Sugar this AM 120.  Insulin reduced.  Reiterated diabetes management.  She verbalized understanding. She is moving in with her daughter in [place] this month.  No concerns.         SDOH assessments and interventions completed:  Yes     Care Coordination Interventions:  Yes, provided   Follow up plan: Follow up call scheduled for January    Encounter Outcome:  Patient Visit Completed   Bary Leriche, RN, MSN [place]  [facility], [facility] Management Community Coordinator Direct Dial: [phone]  Fax: [phone] Website: [email or web address]

## 2023-07-04 ENCOUNTER — Ambulatory Visit: Payer: Medicare PPO

## 2023-07-11 ENCOUNTER — Ambulatory Visit
Admission: RE | Admit: 2023-07-11 | Discharge: 2023-07-11 | Disposition: A | Discharge status: Home / Self Care | Payer: Medicare PPO | Source: Ambulatory Visit | Attending: Internal Medicine | Admitting: Internal Medicine

## 2023-07-11 ENCOUNTER — Other Ambulatory Visit: Payer: Self-pay | Admitting: Internal Medicine

## 2023-07-11 DIAGNOSIS — Z1231 Encounter for screening mammogram for malignant neoplasm of breast: Secondary | ICD-10-CM | POA: Diagnosis not present

## 2023-08-16 ENCOUNTER — Other Ambulatory Visit: Payer: Self-pay | Admitting: Family Medicine

## 2023-08-28 ENCOUNTER — Ambulatory Visit: Payer: Self-pay

## 2023-08-28 NOTE — Patient Outreach (Signed)
  Care Coordination   Follow Up Visit Note   08/28/2023 Name: Sabrina Aguirre MRN: [id number] DOB: [birth year]  Sabrina Aguirre is a 83 y.o. year old female who sees Bakare, Ezekiel NOVAK, MD for primary care. I spoke with  Naomie JINNY Chester by phone today.  What matters to the patients health and wellness today?  Diabetes management    Goals Addressed             This Visit's Progress    Diabetes Management       Patient Goals/Self Care Activities: -Patient/Caregiver will self-administer medications as prescribed as evidenced by self-report/primary caregiver report  -Patient/Caregiver will attend all scheduled provider appointments as evidenced by clinician review of documented attendance to scheduled appointments and patient/caregiver report -Patient/Caregiver will call provider office for new concerns or questions as evidenced by review of documented incoming telephone call notes and patient report  -check blood sugar at prescribed times -check blood sugar if I feel it is too high or too low -record values and write them down take them to all doctor visits  -check feet daily for cuts, sores or redness    Patient doing okay.  She thinks she pulled a  muscle on her side from lifting with her recent move.  Discussed pain control and heating pad to assist.  Also advised to see physician if  pain does not improve.  She verbalized understanding. Most recent blood sugar 110.  Encouraged continued diabetes management.  She verbalized understanding. No RN CM concerns.         SDOH assessments and interventions completed:  Yes     Care Coordination Interventions:  Yes, provided   Follow up plan: Follow up call scheduled for March    Encounter Outcome:  Patient Visit Completed   Harlis Champoux J Shar Paez, RN, MSN RN Care Manager [facility], Population Health Direct Dial: [phone]  Fax: [phone] Website: [email or web address]

## 2023-08-28 NOTE — Patient Instructions (Signed)
 Visit Information  Thank you for taking time to visit with me today. Please don't hesitate to contact me if I can be of assistance to you.   Following are the goals we discussed today:   Goals Addressed             This Visit's Progress    Diabetes Management       Patient Goals/Self Care Activities: -Patient/Caregiver will self-administer medications as prescribed as evidenced by self-report/primary caregiver report  -Patient/Caregiver will attend all scheduled provider appointments as evidenced by clinician review of documented attendance to scheduled appointments and patient/caregiver report -Patient/Caregiver will call provider office for new concerns or questions as evidenced by review of documented incoming telephone call notes and patient report  -check blood sugar at prescribed times -check blood sugar if I feel it is too high or too low -record values and write them down take them to all doctor visits  -check feet daily for cuts, sores or redness    Patient doing okay.  She thinks she pulled a  muscle on her side from lifting with her recent move.  Discussed pain control and heating pad to assist.  Also advised to see physician if  pain does not improve.  She verbalized understanding. Most recent blood sugar 110.  Encouraged continued diabetes management.  She verbalized understanding. No RN CM concerns.         Our next appointment is by telephone on 10/23/23 at 1000 am  Please call the care guide team at [phone] if you need to cancel or reschedule your appointment.   If you are experiencing a Mental Health or Behavioral Health Crisis or need someone to talk to, please call the Suicide and Crisis Lifeline: 988   Patient verbalizes understanding of instructions and care plan provided today and agrees to view in MyChart. Active MyChart status and patient understanding of how to access instructions and care plan via MyChart confirmed with patient.     The patient has  been provided with contact information for the care management team and has been advised to call with any health related questions or concerns.   Rutha Melgoza J Dickey Caamano, RN, MSN RN Care Manager [facility], Population Health Direct Dial: [phone]  Fax: [phone] Website: [email or web address]

## 2023-09-19 DIAGNOSIS — L659 Nonscarring hair loss, unspecified: Secondary | ICD-10-CM | POA: Diagnosis not present

## 2023-09-19 DIAGNOSIS — R809 Proteinuria, unspecified: Secondary | ICD-10-CM | POA: Diagnosis not present

## 2023-09-19 DIAGNOSIS — E119 Type 2 diabetes mellitus without complications: Secondary | ICD-10-CM | POA: Diagnosis not present

## 2023-09-19 DIAGNOSIS — Z794 Long term (current) use of insulin: Secondary | ICD-10-CM | POA: Diagnosis not present

## 2023-09-19 DIAGNOSIS — R6889 Other general symptoms and signs: Secondary | ICD-10-CM | POA: Diagnosis not present

## 2023-09-19 DIAGNOSIS — R030 Elevated blood-pressure reading, without diagnosis of hypertension: Secondary | ICD-10-CM | POA: Diagnosis not present

## 2023-10-23 ENCOUNTER — Ambulatory Visit: Payer: Self-pay

## 2023-10-23 NOTE — Patient Instructions (Signed)
 Visit Information  Thank you for taking time to visit with me today. Please don't hesitate to contact me if I can be of assistance to you.   Following are the goals we discussed today:   Goals Addressed             This Visit's Progress    COMPLETED: Diabetes Management       Patient Goals/Self Care Activities: -Patient/Caregiver will self-administer medications as prescribed as evidenced by self-report/primary caregiver report  -Patient/Caregiver will attend all scheduled provider appointments as evidenced by clinician review of documented attendance to scheduled appointments and patient/caregiver report -Patient/Caregiver will call provider office for new concerns or questions as evidenced by review of documented incoming telephone call notes and patient report  -check blood sugar at prescribed times -check blood sugar if I feel it is too high or too low -record values and write them down take them to all doctor visits  -check feet daily for cuts, sores or redness    Patient doing good.  Most recent blood sugar 112.  Encouraged continued diabetes management.  She verbalized understanding. Discussed with patient that she is meeting goals and doing well with her diabetes management.  Advised that CM would close case. She is agreeable.             If you are experiencing a Mental Health or Behavioral Health Crisis or need someone to talk to, please call the Suicide and Crisis Lifeline: 988   Patient verbalizes understanding of instructions and care plan provided today and agrees to view in MyChart. Active MyChart status and patient understanding of how to access instructions and care plan via MyChart confirmed with patient.     No further follow up required: Patient meeting goal  Deontra Pereyra Idelle Jo RN, MSN [facility], [facility] Health RN Care Manager Direct Dial: [phone]  Fax: [phone] Website: [email or web address]

## 2023-10-23 NOTE — Patient Outreach (Signed)
 Care Coordination   Follow Up Visit Note   10/23/2023 Name: Sabrina Aguirre MRN: [id number] DOB: 12/30/1940  Sabrina Aguirre is a 83 y.o. year old female who sees Bakare, Cyndee Brightly, MD for primary care. I spoke with  [name] by phone today.  What matters to the patients health and wellness today?  Maintaining health    Goals Addressed             This Visit's Progress    COMPLETED: Diabetes Management       Patient Goals/Self Care Activities: -Patient/Caregiver will self-administer medications as prescribed as evidenced by self-report/primary caregiver report  -Patient/Caregiver will attend all scheduled provider appointments as evidenced by clinician review of documented attendance to scheduled appointments and patient/caregiver report -Patient/Caregiver will call provider office for new concerns or questions as evidenced by review of documented incoming telephone call notes and patient report  -check blood sugar at prescribed times -check blood sugar if I feel it is too high or too low -record values and write them down take them to all doctor visits  -check feet daily for cuts, sores or redness    Patient doing good.  Most recent blood sugar 112.  Encouraged continued diabetes management.  She verbalized understanding. Discussed with patient that she is meeting goals and doing well with her diabetes management.  Advised that CM would close case. She is agreeable.           SDOH assessments and interventions completed:  Yes     Care Coordination Interventions:  Yes, provided   Follow up plan: No further intervention required.   Encounter Outcome:  Patient Visit Completed   Bary Leriche RN, MSN [facility], [facility] Health RN Care Manager Direct Dial: [phone]  Fax: [phone] Website: [email or web address]

## 2023-10-24 DIAGNOSIS — E1165 Type 2 diabetes mellitus with hyperglycemia: Secondary | ICD-10-CM | POA: Diagnosis not present

## 2023-10-24 DIAGNOSIS — I10 Essential (primary) hypertension: Secondary | ICD-10-CM | POA: Diagnosis not present

## 2023-10-24 DIAGNOSIS — D649 Anemia, unspecified: Secondary | ICD-10-CM | POA: Diagnosis not present

## 2023-10-24 DIAGNOSIS — N182 Chronic kidney disease, stage 2 (mild): Secondary | ICD-10-CM | POA: Diagnosis not present

## 2023-10-24 DIAGNOSIS — E1122 Type 2 diabetes mellitus with diabetic chronic kidney disease: Secondary | ICD-10-CM | POA: Diagnosis not present

## 2023-10-24 DIAGNOSIS — I4719 Other supraventricular tachycardia: Secondary | ICD-10-CM | POA: Diagnosis not present

## 2023-10-24 DIAGNOSIS — Z9081 Acquired absence of spleen: Secondary | ICD-10-CM | POA: Diagnosis not present

## 2023-10-24 DIAGNOSIS — Z794 Long term (current) use of insulin: Secondary | ICD-10-CM | POA: Diagnosis not present

## 2023-10-24 DIAGNOSIS — I160 Hypertensive urgency: Secondary | ICD-10-CM | POA: Diagnosis not present

## 2023-10-25 ENCOUNTER — Other Ambulatory Visit: Payer: Self-pay | Admitting: Internal Medicine

## 2023-10-25 DIAGNOSIS — R0989 Other specified symptoms and signs involving the circulatory and respiratory systems: Secondary | ICD-10-CM

## 2023-10-30 DIAGNOSIS — N390 Urinary tract infection, site not specified: Secondary | ICD-10-CM | POA: Diagnosis not present

## 2023-10-30 DIAGNOSIS — I10 Essential (primary) hypertension: Secondary | ICD-10-CM | POA: Diagnosis not present

## 2023-11-01 ENCOUNTER — Ambulatory Visit
Admission: RE | Admit: 2023-11-01 | Discharge: 2023-11-01 | Disposition: A | Discharge status: Home / Self Care | Source: Ambulatory Visit | Attending: Internal Medicine | Admitting: Internal Medicine

## 2023-11-01 DIAGNOSIS — R0989 Other specified symptoms and signs involving the circulatory and respiratory systems: Secondary | ICD-10-CM

## 2023-11-05 ENCOUNTER — Other Ambulatory Visit: Payer: Self-pay | Admitting: Internal Medicine

## 2023-11-05 DIAGNOSIS — N210 Calculus in bladder: Secondary | ICD-10-CM

## 2023-11-07 ENCOUNTER — Encounter: Payer: Self-pay | Admitting: Internal Medicine

## 2023-11-07 ENCOUNTER — Encounter: Payer: Self-pay | Admitting: Family Medicine

## 2023-11-07 DIAGNOSIS — H02422 Myogenic ptosis of left eyelid: Secondary | ICD-10-CM | POA: Diagnosis not present

## 2023-11-07 DIAGNOSIS — E119 Type 2 diabetes mellitus without complications: Secondary | ICD-10-CM | POA: Diagnosis not present

## 2023-11-07 DIAGNOSIS — H43812 Vitreous degeneration, left eye: Secondary | ICD-10-CM | POA: Diagnosis not present

## 2023-11-07 DIAGNOSIS — H26493 Other secondary cataract, bilateral: Secondary | ICD-10-CM | POA: Diagnosis not present

## 2023-11-07 DIAGNOSIS — G43B0 Ophthalmoplegic migraine, not intractable: Secondary | ICD-10-CM | POA: Diagnosis not present

## 2023-11-07 DIAGNOSIS — Z961 Presence of intraocular lens: Secondary | ICD-10-CM | POA: Diagnosis not present

## 2023-11-07 DIAGNOSIS — H401131 Primary open-angle glaucoma, bilateral, mild stage: Secondary | ICD-10-CM | POA: Diagnosis not present

## 2023-11-07 LAB — HM DIABETES EYE EXAM

## 2023-11-11 ENCOUNTER — Other Ambulatory Visit: Payer: Self-pay | Admitting: Family Medicine

## 2023-11-14 ENCOUNTER — Ambulatory Visit
Admission: RE | Admit: 2023-11-14 | Discharge: 2023-11-14 | Disposition: A | Discharge status: Home / Self Care | Source: Ambulatory Visit | Attending: Internal Medicine | Admitting: Internal Medicine

## 2023-11-14 DIAGNOSIS — D3502 Benign neoplasm of left adrenal gland: Secondary | ICD-10-CM | POA: Diagnosis not present

## 2023-11-14 DIAGNOSIS — N210 Calculus in bladder: Secondary | ICD-10-CM

## 2023-11-14 DIAGNOSIS — N200 Calculus of kidney: Secondary | ICD-10-CM | POA: Diagnosis not present

## 2023-11-23 DIAGNOSIS — M545 Low back pain, unspecified: Secondary | ICD-10-CM | POA: Diagnosis not present

## 2023-11-23 DIAGNOSIS — D3502 Benign neoplasm of left adrenal gland: Secondary | ICD-10-CM | POA: Diagnosis not present

## 2023-11-23 DIAGNOSIS — N200 Calculus of kidney: Secondary | ICD-10-CM | POA: Diagnosis not present

## 2023-11-23 DIAGNOSIS — G8929 Other chronic pain: Secondary | ICD-10-CM | POA: Diagnosis not present

## 2023-11-23 DIAGNOSIS — R310 Gross hematuria: Secondary | ICD-10-CM | POA: Diagnosis not present

## 2023-12-11 DIAGNOSIS — G8929 Other chronic pain: Secondary | ICD-10-CM | POA: Diagnosis not present

## 2023-12-11 DIAGNOSIS — I10 Essential (primary) hypertension: Secondary | ICD-10-CM | POA: Diagnosis not present

## 2023-12-11 DIAGNOSIS — M545 Low back pain, unspecified: Secondary | ICD-10-CM | POA: Diagnosis not present

## 2023-12-11 DIAGNOSIS — N200 Calculus of kidney: Secondary | ICD-10-CM | POA: Diagnosis not present

## 2023-12-11 DIAGNOSIS — M25552 Pain in left hip: Secondary | ICD-10-CM | POA: Diagnosis not present

## 2023-12-12 ENCOUNTER — Other Ambulatory Visit: Payer: Self-pay | Admitting: Internal Medicine

## 2023-12-12 ENCOUNTER — Ambulatory Visit
Admission: RE | Admit: 2023-12-12 | Discharge: 2023-12-12 | Disposition: A | Discharge status: Home / Self Care | Source: Ambulatory Visit | Attending: Internal Medicine | Admitting: Internal Medicine

## 2023-12-12 DIAGNOSIS — M1612 Unilateral primary osteoarthritis, left hip: Secondary | ICD-10-CM | POA: Diagnosis not present

## 2023-12-12 DIAGNOSIS — M25552 Pain in left hip: Secondary | ICD-10-CM | POA: Diagnosis not present

## 2023-12-19 DIAGNOSIS — R809 Proteinuria, unspecified: Secondary | ICD-10-CM | POA: Diagnosis not present

## 2023-12-19 DIAGNOSIS — E1129 Type 2 diabetes mellitus with other diabetic kidney complication: Secondary | ICD-10-CM | POA: Diagnosis not present

## 2023-12-19 DIAGNOSIS — Z7984 Long term (current) use of oral hypoglycemic drugs: Secondary | ICD-10-CM | POA: Diagnosis not present

## 2023-12-19 DIAGNOSIS — I10 Essential (primary) hypertension: Secondary | ICD-10-CM | POA: Diagnosis not present

## 2023-12-19 DIAGNOSIS — Z794 Long term (current) use of insulin: Secondary | ICD-10-CM | POA: Diagnosis not present

## 2023-12-19 DIAGNOSIS — E11649 Type 2 diabetes mellitus with hypoglycemia without coma: Secondary | ICD-10-CM | POA: Diagnosis not present

## 2024-01-22 ENCOUNTER — Ambulatory Visit: Payer: Medicare PPO

## 2024-02-01 ENCOUNTER — Other Ambulatory Visit: Payer: Self-pay | Admitting: Family Medicine

## 2024-03-12 ENCOUNTER — Ambulatory Visit: Admitting: Podiatry

## 2024-03-12 DIAGNOSIS — Q828 Other specified congenital malformations of skin: Secondary | ICD-10-CM
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
## 2024-03-25 ENCOUNTER — Encounter: Payer: Self-pay | Admitting: Physical Therapy

## 2024-03-25 ENCOUNTER — Ambulatory Visit: Attending: Internal Medicine | Admitting: Physical Therapy

## 2024-03-25 ENCOUNTER — Other Ambulatory Visit: Payer: Self-pay

## 2024-03-25 DIAGNOSIS — R262 Difficulty in walking, not elsewhere classified: Secondary | ICD-10-CM | POA: Insufficient documentation

## 2024-03-25 DIAGNOSIS — M5459 Other low back pain: Secondary | ICD-10-CM | POA: Diagnosis present

## 2024-03-25 DIAGNOSIS — M6281 Muscle weakness (generalized): Secondary | ICD-10-CM | POA: Insufficient documentation
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
## 2024-04-03 ENCOUNTER — Ambulatory Visit

## 2024-04-03 DIAGNOSIS — R262 Difficulty in walking, not elsewhere classified: Secondary | ICD-10-CM

## 2024-04-03 DIAGNOSIS — M5459 Other low back pain: Secondary | ICD-10-CM | POA: Diagnosis not present

## 2024-04-03 DIAGNOSIS — M6281 Muscle weakness (generalized): Secondary | ICD-10-CM
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
## 2024-04-07 ENCOUNTER — Ambulatory Visit
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
## 2024-04-09 ENCOUNTER — Encounter: Payer: Self-pay | Admitting: General Practice

## 2024-04-09 ENCOUNTER — Other Ambulatory Visit: Payer: Self-pay | Admitting: General Practice

## 2024-04-09 ENCOUNTER — Ambulatory Visit: Attending: Cardiovascular Disease | Admitting: General Practice

## 2024-04-09 ENCOUNTER — Ambulatory Visit

## 2024-04-09 VITALS — BP 152/100 | HR 94 | Ht 63.0 in | Wt 160.0 lb

## 2024-04-09 DIAGNOSIS — I10 Essential (primary) hypertension: Secondary | ICD-10-CM | POA: Diagnosis not present

## 2024-04-09 DIAGNOSIS — R002 Palpitations: Secondary | ICD-10-CM

## 2024-04-09 MED ORDER — DILTIAZEM HCL ER COATED BEADS 120 MG PO CP24: 120.0000 mg | ORAL_CAPSULE | Freq: Every day | ORAL | 3 refills | Status: DC

## 2024-04-09 NOTE — Patient Instructions (Addendum)
Medication Instructions:  Your physician has recommended you make the following change in your medication:  START DILTIAZEM  120 MG DAILY   PLEASE CALL OUR OFFICE AT [phone] TO LET US  KNOW THE METOPROLOL  YOU ARE ON. METOPROLOL  TARTRATE OR SUCCINATE  *If you need a refill on your cardiac medications before your next appointment, please call your pharmacy*  Lab Work: NONE If you have labs (blood work) drawn today and your tests are completely normal, you will receive your results only by: MyChart Message (if you have MyChart) OR A paper copy in the mail If you have any lab test that is abnormal or we need to change your treatment, we will call you to review the results.  Testing/Procedures: Sabrina Aguirre- Long Term Monitor Instructions  Your physician has requested you wear a ZIO patch monitor for 7 days.  This is a single patch monitor. [employer] supplies one patch monitor per enrollment. Additional stickers are not available. Please do not apply patch if you will be having a Nuclear Stress Test,  Echocardiogram, Cardiac CT, MRI, or Chest Xray during the period you would be wearing the  monitor. The patch cannot be worn during these tests. You cannot remove and re-apply the  ZIO XT patch monitor.  Your ZIO patch monitor will be mailed 3 day USPS to your address on file. It may take 3-5 days  to receive your monitor after you have been enrolled.  Once you have received your monitor, please review the enclosed instructions. Your monitor  has already been registered assigning a specific monitor serial # to you.  Billing and Patient Assistance Program Information  We have supplied [employer] with any of your insurance information on file for billing purposes. [employer] offers a sliding scale Patient Assistance Program for patients that do not have  insurance, or whose insurance does not completely cover the cost of the ZIO monitor.  You must apply for the Patient Assistance Program to qualify for  this discounted rate.  To apply, please call [employer] at [phone], select option 4, select option 2, ask to apply for  Patient Assistance Program. Sabrina Aguirre will ask your household income, and how many people  are in your household. They will quote your out-of-pocket cost based on that information.  [employer] will also be able to set up a 51-month, interest-free payment plan if needed.  Applying the monitor   Shave hair from upper left chest.  Hold abrader disc by orange tab. Rub abrader in 40 strokes over the upper left chest as  indicated in your monitor instructions.  Clean area with 4 enclosed alcohol pads. Let dry.  Apply patch as indicated in monitor instructions. Patch will be placed under collarbone on left  side of chest with arrow pointing upward.  Rub patch adhesive wings for 2 minutes. Remove white label marked 1. Remove the white  label marked 2. Rub patch adhesive wings for 2 additional minutes.  While looking in a mirror, press and release button in center of patch. A small green light will  flash 3-4 times. This will be your only indicator that the monitor has been turned on.  Do not shower for the first 24 hours. You may shower after the first 24 hours.  Press the button if you feel a symptom. You will hear a small click. Record Date, Time and  Symptom in the Patient Logbook.  When you are ready to remove the patch, follow instructions on the last 2 pages of Patient  Logbook.  Stick patch monitor onto the last page of Patient Logbook.  Place Patient Logbook in the blue and white box. Use locking tab on box and tape box closed  securely. The blue and white box has prepaid postage on it. Please place it in the mailbox as  soon as possible. Your physician should have your test results approximately 7 days after the  monitor has been mailed back to [facility].  Call [facility] Customer Care at [phone] if you have questions regarding  your ZIO XT patch monitor.  Call them immediately if you see an orange light blinking on your  monitor.  If your monitor falls off in less than 4 days, contact our Monitor department at [phone].  If your monitor becomes loose or falls off after 4 days call [employer] at [phone] for  suggestions on securing your monitor   Follow-Up: At [facility], you and your health needs are our priority.  As part of our continuing mission to provide you with exceptional heart care, our providers are all part of one team.  This team includes your primary Cardiologist (physician) and Advanced Practice Providers or APPs (Physician Assistants and Nurse Practitioners) who all work together to provide you with the care you need, when you need it.  Your next appointment:   1-2 month(s)  Provider:   JOSEFA BEAUVAIS, NP  We recommend signing up for the patient portal called MyChart.  Sign up information is provided on this After Visit Summary.  MyChart is used to connect with patients for Virtual Visits (Telemedicine).  Patients are able to view lab/test results, encounter notes, upcoming appointments, etc.  Non-urgent messages can be sent to your provider as well.   To learn more about what you can do with MyChart, go to [email or web address].   Other Instructions [employer]  Outlet Store  Training and development officer for [employer] Mailing Address:  [address];   [address]  [place], [place] [address]  Tel [phone] Fx [phone]     High Quality Legwear for Today's Active Lifestyles Maximum Compression at the ankle. Compression lessens gradually up the leg.   We manufacture a wide range of compression hosiery for men and women in  different styles, constructions and levels of support.  How Compression Hosiery Works Regulatory affairs officer, [employer]. compression hosiery works by applying graduated pressure to the  muscles and veins in the legs.  When the calf muscle contracts such as during walking   the compression hosiery will "give" and then return to its original position. By doing so  the hosiery is assists your body's circulatory wellness.  The result is increased leg health and vitality.   Maximum Compression at the ankle Compression lessens gradually up the leg  We Offer: Sheer & Opaque Stockings       COLORS:  Nude, black, white and misc. prints Below Knee Thigh High Pantyhose  High Quality Legwear for Today's Active Lifestyles We manufacture a wide range of compression hosiery for men and women in different styles, construction sand levels of support.  Socks:                     Sheer & Opaque   Compression Levels Include:                                  Stockings Men's  Below Knee                8-15 mmHg   Women's         Thigh High                 15-20 mmHg  Unisex             Pantyhose                 20-30 mmHg                                                                      30-40 mmHg  4 Simple Ways to Order   Email  [email or web address] Mail/Email orders are subject to processing and handling charges. Allow 7-10 days for receipt.  Phone [phone]  Please allow 24 hours for return call.   In Person  We recommend calling prior to your visit to confirm store hours as they may change due to holiday, weather, and maintenance.   By Mail When placing an order, please have the following information available. Our representatives are available to assist.     Measurements    THIGH      in.   CALF        in.   ANKLE     in.    Compression  8-15 mmHg 15-20 mmHg**   20-30 mmHg 30-40 mmHg   WOMEN'S MEN'S  Shoe Size Sock Size Shoe Size Sock Size  4 - 5 Small 7.5 and Under Small  5.5 - 7.5 Medium 8 - 10 Medium  8 - 10 Large 10.5 - 12 Large  10.5 and Over X-Large 12.5 and Over X-Large   Knee High Size Chart  Length from CALF MEASUREMENT  floor to bend   in knee. 11 12 13 14 15 16 17 18 19 20  21" 22"  14 S S S S M M L L L  XL XL XL  15 S S S M M L L L XL XL XXL XXL  16 S S M M M L L L XL XXL XXL XXL  17 S M M M M L L XL XL XXL XXL XXL  18 M M M M L L L XL XL XXL XXL XXL  19 M M M M L L XL XL XL XXL XXL XXL   Thigh High Circumference Sizing Chart                 S M L XL XXL  ANKLE 6.5 - 8 8 - 9.5 9.5 - 11 11 - 12.5 12.5 - 14  CALF 10.5 - 14.5 11.5 - 15.5 12.5 - 17 13.5 - 17.5 14.5 - 19.5  THIGH 15.5 - 22 17.5 - 24 19.5 - 26 22 - 28 26 - 32  HIP UP TO 40 UP TO 44 UP TO 48' UP TO 52 UP TO 56   Pantyhose Size Chart  Height Petite Medium Tall X-Tall Queen Queen +   Weight Weight Weight Weight Weight Weight  4'11 95-130 135      5'0 95-125 130-145   170-185   5'1 90-120 125-155 160-165  170-195   5'2 90-115 120-145 150-165  170-195   5'3 90-110 115-140 145-165  170-200 200-225  5'4 100-105 110-135 140-160 165 170-200 200-225  5'5 100 105-130 135-160 165 170-200 200-225  5'6  110-125 130-155 160-165 170-200 200-225  5'7  110-120 125-150 155-165 170-200 195-225  5'8   120-145 150-165 170-200 190-225  5'9   125-140 145-170 175-190 185-220  5'10   125-135 140-185  185-215  5'11   130-135 140-185  190-210        

## 2024-04-09 NOTE — Progress Notes (Unsigned)
 Enrolled patient for a 7 day Zio XT monitor to be mailed to patients home   [place]

## 2024-04-10 ENCOUNTER — Ambulatory Visit: Admitting: Physical Therapy

## 2024-04-10 ENCOUNTER — Encounter: Payer: Self-pay | Admitting: Physical Therapy

## 2024-04-10 ENCOUNTER — Telehealth: Payer: Self-pay | Admitting: Cardiovascular Disease

## 2024-04-10 DIAGNOSIS — R262 Difficulty in walking, not elsewhere classified: Secondary | ICD-10-CM

## 2024-04-10 DIAGNOSIS — M6281 Muscle weakness (generalized): Secondary | ICD-10-CM

## 2024-04-10 DIAGNOSIS — M5459 Other low back pain: Secondary | ICD-10-CM

## 2024-04-10 MED ORDER — DILTIAZEM HCL ER COATED BEADS 120 MG PO CP24: 120.0000 mg | ORAL_CAPSULE | Freq: Every day | ORAL | 3 refills | Status: DC

## 2024-04-10 NOTE — Telephone Encounter (Signed)
.  Pt c/o medication issue:  1. Name of Medication: diltiazem  (CARDIZEM  CD) 120 MG 24 hr capsule   2. How are you currently taking this medication (dosage and times per day)?    3. Are you having a reaction (difficulty breathing--STAT)? no  4. What is your medication issue? Patient states the pharmacy told her this medication clash with another medication she is already taking. Please advise
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
## 2024-04-10 NOTE — Telephone Encounter (Addendum)
 Spoke with pt, she got a message from the pharmacy that she was taking another medication that was the same as the diltiazem . Patient reports her medical doctor placed her on amlodipine but she wants to take the diltiazem . Patient made aware okay to stop the amlodipine. She would like me to call the pharmacy. She also wanted to let jesse know she is taking metoprolol  xl 100 mg daily. New script sent to the pharmacy telling them she is stopping the amlodipine.

## 2024-04-11 NOTE — Progress Notes (Signed)
 Sabrina Aguirre, I am usually cautious in using simultaneous beta-blockers and centrally acting calcium  channel blockers in an elderly patient, due to the high risk of high-grade AV block.  I realize that she had some RVR when you saw her and I know that you put a monitor on.  Even so, if we consider her a candidate for cardioversion, I would worry about severe bradycardia after the cardioversion.  I think it is probably better to just increase the dose of her beta-blocker for rate control and then use a different agent (that does not have AV node blocking properties) to get her blood pressure in range. Thanks
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
## 2024-04-14 ENCOUNTER — Telehealth: Payer: Self-pay

## 2024-04-14 MED ORDER — METOPROLOL SUCCINATE ER 100 MG PO TB24: 100.0000 mg | ORAL_TABLET | Freq: Every day | ORAL | Status: DC

## 2024-04-14 NOTE — Telephone Encounter (Signed)
-----   Message from [name] sent at 04/11/2024  1:14 PM EDT ----- Please contact  [name] and let her know that Dr. Francyne and I have reviewed her medications as well as her blood pressure.  His recommendation was that we increase metoprolol  from 50 mg to 100 mg.  Please ask her to not start the diltiazem  medication that we discussed yesterday.  Thank you.  Josefa CHRISTELLA. Cleaver NP-C  [image]   04/11/2024, 1:18 PM [facility] [address] [place], [place] [address] Office [phone] ----- Message ----- From: Francyne Headland, MD Sent: 04/11/2024  12:56 PM EDT To: [name], NP

## 2024-04-14 NOTE — Telephone Encounter (Signed)
 Patient states she already started diltiazem  on 8/22. She is currently taking metoprolol  50 mg daily.  Will forward to [place] to see if he would like her to discontinue diltazem for now and just increase Toprol  XL to 100 mg daily.  Patient requested callback tomorrow afternoon, she has physical therapy at 11 AM.

## 2024-04-14 NOTE — Telephone Encounter (Signed)
 Share response from [place] with patient: Good morning,    Yes.  We will discontinue diltiazem  and increase metoprolol  at this time.  Thank you.   Sabrina Aguirre. Cleaver NP-C   Med list updated. Patient states will call when she is ready for new prescription to be sent in.

## 2024-04-15 ENCOUNTER — Ambulatory Visit

## 2024-04-15 ENCOUNTER — Telehealth: Payer: Self-pay | Admitting: Cardiovascular Disease

## 2024-04-15 DIAGNOSIS — R262 Difficulty in walking, not elsewhere classified: Secondary | ICD-10-CM

## 2024-04-15 DIAGNOSIS — M6281 Muscle weakness (generalized): Secondary | ICD-10-CM

## 2024-04-15 DIAGNOSIS — M5459 Other low back pain: Secondary | ICD-10-CM

## 2024-04-15 NOTE — Telephone Encounter (Signed)
 Called patient's daughter (DPR) back about her message. Patient's daughter stated patient was prescibed the wrong medication and then called days later and told to stop taking that medications and take something else. Patient's daughter wants to know what is going on.  1- On 04/09/24 Josefa Beauvais ordered diltiazem  for palpitations, and A. Fib- EKG showed atrial fibrillation with RVR 103 bpm.  2- On 04/11/24 Dr. Francyne sent message to Josefa Beauvais NP as follow-   Croitoru, Jerel, MD to [place] Josefa HERO, NP   04/11/24 12:56 PM Note    Josefa, I am usually cautious in using simultaneous beta-blockers and centrally acting calcium  channel blockers in an elderly patient, due to the high risk of high-grade AV block.  I realize that she had some RVR when you saw her and I know that you put a monitor on.  Even so, if we consider her a candidate for cardioversion, I would worry about severe bradycardia after the cardioversion.  I think it is probably better to just increase the dose of her beta-blocker for rate control and then use a different agent (that does not have AV node blocking properties) to get her blood pressure in range. Thanks     3- On 04/11/24 Josefa Heap NP sent message to RN to call patient about the below changes.  [place] Josefa HERO, NP to Vicci Roxie CROME, RN    04/11/24  1:18 PM Please contact  Ms. Tersigni and let her know that Dr. Francyne and I have reviewed her medications as well as her blood pressure.  His recommendation was that we increase metoprolol  from 50 mg to 100 mg.  Please ask her to not start the diltiazem  medication that we discussed yesterday.  Thank you.  Josefa HERO. Cleaver NP-C   4- On 04/14/24 RN informed patient of changes   Informed patient's daughter the reason medication was changed. She requested to have patient only see Dr. Francyne from now on.

## 2024-04-15 NOTE — Telephone Encounter (Signed)
 Pt c/o swelling/edema: STAT if pt has developed SOB within 24 hours  If swelling, where is the swelling located?   Both legs last night  How much weight have you gained and in what time span?  Unknown  Have you gained 2 pounds in a day or 5 pounds in a week?   Unknown  Do you have a log of your daily weights (if so, list)?   No  Are you currently taking a fluid pill?   Yes  Are you currently SOB?  No  Have you traveled recently in a car or plane for an extended period of time?   Daughter Stoney) is concerned patient is taking medication she was not supposed to be taking.  Daughter stated patient's swelling has gone down since last night. Daughter wants a call back to discuss patient's medication change.
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
## 2024-04-17 ENCOUNTER — Ambulatory Visit

## 2024-04-17 DIAGNOSIS — M6281 Muscle weakness (generalized): Secondary | ICD-10-CM

## 2024-04-17 DIAGNOSIS — R262 Difficulty in walking, not elsewhere classified: Secondary | ICD-10-CM

## 2024-04-17 DIAGNOSIS — M5459 Other low back pain: Secondary | ICD-10-CM

## 2024-04-24 ENCOUNTER — Encounter: Payer: Self-pay | Admitting: Physical Therapy

## 2024-04-24 ENCOUNTER — Ambulatory Visit: Attending: Internal Medicine | Admitting: Physical Therapy

## 2024-04-24 DIAGNOSIS — M6281 Muscle weakness (generalized): Secondary | ICD-10-CM | POA: Diagnosis present

## 2024-04-24 DIAGNOSIS — M5459 Other low back pain: Secondary | ICD-10-CM | POA: Diagnosis present

## 2024-04-24 DIAGNOSIS — R262 Difficulty in walking, not elsewhere classified: Secondary | ICD-10-CM | POA: Diagnosis present
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
## 2024-05-01 ENCOUNTER — Ambulatory Visit

## 2024-05-01 DIAGNOSIS — M5459 Other low back pain: Secondary | ICD-10-CM | POA: Diagnosis not present

## 2024-05-01 DIAGNOSIS — R262 Difficulty in walking, not elsewhere classified: Secondary | ICD-10-CM

## 2024-05-01 DIAGNOSIS — M6281 Muscle weakness (generalized): Secondary | ICD-10-CM
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
## 2024-05-05 ENCOUNTER — Other Ambulatory Visit: Payer: Self-pay | Admitting: Family

## 2024-05-06 ENCOUNTER — Ambulatory Visit

## 2024-05-06 DIAGNOSIS — M5459 Other low back pain: Secondary | ICD-10-CM

## 2024-05-06 DIAGNOSIS — M6281 Muscle weakness (generalized): Secondary | ICD-10-CM

## 2024-05-06 DIAGNOSIS — R262 Difficulty in walking, not elsewhere classified: Secondary | ICD-10-CM
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
## 2024-05-08 ENCOUNTER — Ambulatory Visit

## 2024-05-15 ENCOUNTER — Ambulatory Visit: Admitting: Physical Therapy

## 2024-05-15 ENCOUNTER — Encounter: Payer: Self-pay | Admitting: Physical Therapy

## 2024-05-15 DIAGNOSIS — M6281 Muscle weakness (generalized): Secondary | ICD-10-CM

## 2024-05-15 DIAGNOSIS — M5459 Other low back pain: Secondary | ICD-10-CM

## 2024-05-15 DIAGNOSIS — R262 Difficulty in walking, not elsewhere classified: Secondary | ICD-10-CM
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
## 2024-05-20 ENCOUNTER — Encounter: Payer: Self-pay | Admitting: Cardiovascular Disease

## 2024-05-20 ENCOUNTER — Ambulatory Visit: Attending: Cardiovascular Disease | Admitting: Cardiovascular Disease

## 2024-05-20 VITALS — BP 160/100 | HR 108 | Ht 63.0 in | Wt 162.0 lb

## 2024-05-20 DIAGNOSIS — I10 Essential (primary) hypertension: Secondary | ICD-10-CM

## 2024-05-20 DIAGNOSIS — E119 Type 2 diabetes mellitus without complications: Secondary | ICD-10-CM

## 2024-05-20 DIAGNOSIS — D6869 Other thrombophilia: Secondary | ICD-10-CM

## 2024-05-20 DIAGNOSIS — I4891 Unspecified atrial fibrillation: Secondary | ICD-10-CM | POA: Diagnosis not present

## 2024-05-20 MED ORDER — AMLODIPINE BESYLATE 10 MG PO TABS: 10.0000 mg | ORAL_TABLET | Freq: Every day | ORAL | 3 refills | Status: AC

## 2024-05-20 NOTE — Patient Instructions (Addendum)
 Medication Instructions:   Amlodipine to 10 mg daily (Please call us  with the correct dose of Metoprolol  that you are currently taking) *If you need a refill on your cardiac medications before your next appointment, please call your pharmacy*  Lab Work: None ordered If you have labs (blood work) drawn today and your tests are completely normal, you will receive your results only by: MyChart Message (if you have MyChart) OR A paper copy in the mail If you have any lab test that is abnormal or we need to change your treatment, we will call you to review the results.  Testing/Procedures: None ordered  Follow-Up: At [facility], you and your health needs are our priority.  As part of our continuing mission to provide you with exceptional heart care, our providers are all part of one team.  This team includes your primary Cardiologist (physician) and Advanced Practice Providers or APPs (Physician Assistants and Nurse Practitioners) who all work together to provide you with the care you need, when you need it.  Your next appointment:   3-4 months  Provider:   Jerel Balding, MD    We recommend signing up for the patient portal called MyChart.  Sign up information is provided on this After Visit Summary.  MyChart is used to connect with patients for Virtual Visits (Telemedicine).  Patients are able to view lab/test results, encounter notes, upcoming appointments, etc.  Non-urgent messages can be sent to your provider as well.   To learn more about what you can do with MyChart, go to [email or web address].

## 2024-05-22 ENCOUNTER — Ambulatory Visit: Attending: Internal Medicine | Admitting: Physical Therapy

## 2024-05-22 ENCOUNTER — Encounter: Payer: Self-pay | Admitting: Physical Therapy

## 2024-05-22 DIAGNOSIS — R262 Difficulty in walking, not elsewhere classified: Secondary | ICD-10-CM | POA: Diagnosis present

## 2024-05-22 DIAGNOSIS — M5459 Other low back pain: Secondary | ICD-10-CM | POA: Diagnosis present

## 2024-05-22 DIAGNOSIS — M6281 Muscle weakness (generalized): Secondary | ICD-10-CM | POA: Insufficient documentation
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
## 2024-05-24 ENCOUNTER — Telehealth: Payer: Self-pay | Admitting: Physician Assistant

## 2024-05-24 ENCOUNTER — Other Ambulatory Visit: Payer: Self-pay | Admitting: Physician Assistant

## 2024-05-24 MED ORDER — METOPROLOL SUCCINATE ER 100 MG PO TB24: 100.0000 mg | ORAL_TABLET | Freq: Every day | ORAL | 11 refills | Status: AC

## 2024-05-24 NOTE — Telephone Encounter (Signed)
 Patient stated that she had been told to increase her metoprolol  XL up to 100 mg daily.  She had 50 mg tablets and was taking 2 at a time.  She feels her blood pressure and heart rate have been fine on this dose.  However, she is at the end of the bottle and would prefer to get the 100 mg strength instead of taking 2 of the 50s.  I sent in a prescription for Toprol -XL 100 mg daily  Shona Shad, PA-C 05/24/2024 5:58 PM
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
## 2024-05-30 ENCOUNTER — Telehealth: Payer: Self-pay | Admitting: Emergency Medicine

## 2024-05-30 NOTE — Addendum Note (Signed)
 Addended by: Carri Spillers L on: 05/30/2024 10:55 AM   Modules accepted: Orders

## 2024-05-30 NOTE — Telephone Encounter (Signed)
 S/w patient- she takes Metoprolol  100 mg daily Telimisartan 80 mg- does not have the hydrochlorothiazide  in it (she states that she really needs it)  Asked her about BP- She Does not know how to check it herself- Her daughter has been checking it and keeping a log. She will ask her daughter to send it the BP log either over the weekend or Monday.   She reports that she Still has the heart monitor on, will take off next Thursday Informed me that she does have some swelling in ankles and feet. She does elevate them- and it helps it to decrease, but she would love Dr C if he would add the hydrochlorothiazide .   Informed her that I would give this information to Dr Francyne and be back in touch.

## 2024-05-30 NOTE — Telephone Encounter (Signed)
 Croitoru, Jerel, MD  Davee Comer CROME, RN I tried to call and asked the patient if we could clarify her medications.  It sounds like we have figured out that she is on metoprolol  100 mg once daily and we increased her amlodipine to 10 mg daily, but need to figure out the telmisartan  (or is this telmisartan -hydrochlorothiazide ?). Also called the daughter but just got a voicemail. When you get a chance could you try to reach out to them and see what her blood pressure is running now and clarify those medications?  Thank you   Left message and call back number- Informed that this is regarding clarification of her mother's medications and to see how her BP is doing. Asked her to call us  back or send a MyChart message.

## 2024-06-04 ENCOUNTER — Other Ambulatory Visit: Payer: Self-pay | Admitting: Internal Medicine

## 2024-06-04 DIAGNOSIS — Z1231 Encounter for screening mammogram for malignant neoplasm of breast: Secondary | ICD-10-CM

## 2024-06-05 ENCOUNTER — Ambulatory Visit

## 2024-06-05 ENCOUNTER — Encounter: Payer: Self-pay | Admitting: Emergency Medicine

## 2024-06-11 ENCOUNTER — Ambulatory Visit: Admitting: General Practice
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
## 2024-06-12 ENCOUNTER — Ambulatory Visit

## 2024-06-12 DIAGNOSIS — R262 Difficulty in walking, not elsewhere classified: Secondary | ICD-10-CM

## 2024-06-12 DIAGNOSIS — M6281 Muscle weakness (generalized): Secondary | ICD-10-CM | POA: Diagnosis not present

## 2024-06-12 DIAGNOSIS — M5459 Other low back pain: Secondary | ICD-10-CM

## 2024-06-13 DIAGNOSIS — R002 Palpitations: Secondary | ICD-10-CM

## 2024-06-13 NOTE — Telephone Encounter (Signed)
 Left message on Angela's voicemail asking her to send us  some BP readings from her mother please.

## 2024-06-16 ENCOUNTER — Ambulatory Visit: Payer: Self-pay | Admitting: General Practice

## 2024-06-24 ENCOUNTER — Ambulatory Visit: Attending: Internal Medicine | Admitting: Physical Therapy

## 2024-06-24 ENCOUNTER — Encounter: Payer: Self-pay | Admitting: Physical Therapy

## 2024-06-24 DIAGNOSIS — M5459 Other low back pain: Secondary | ICD-10-CM | POA: Insufficient documentation

## 2024-06-24 DIAGNOSIS — R262 Difficulty in walking, not elsewhere classified: Secondary | ICD-10-CM | POA: Diagnosis present

## 2024-06-24 DIAGNOSIS — M6281 Muscle weakness (generalized): Secondary | ICD-10-CM | POA: Diagnosis present
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
## 2024-06-26 ENCOUNTER — Ambulatory Visit

## 2024-06-26 DIAGNOSIS — R262 Difficulty in walking, not elsewhere classified: Secondary | ICD-10-CM

## 2024-06-26 DIAGNOSIS — M6281 Muscle weakness (generalized): Secondary | ICD-10-CM

## 2024-06-26 DIAGNOSIS — M5459 Other low back pain: Secondary | ICD-10-CM
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
## 2024-07-01 ENCOUNTER — Ambulatory Visit: Admitting: Physical Therapy

## 2024-07-01 ENCOUNTER — Encounter: Payer: Self-pay | Admitting: Physical Therapy

## 2024-07-01 DIAGNOSIS — M6281 Muscle weakness (generalized): Secondary | ICD-10-CM

## 2024-07-01 DIAGNOSIS — M5459 Other low back pain: Secondary | ICD-10-CM

## 2024-07-01 DIAGNOSIS — R262 Difficulty in walking, not elsewhere classified: Secondary | ICD-10-CM
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
## 2024-07-03 ENCOUNTER — Ambulatory Visit: Admitting: Physical Therapy

## 2024-07-03 ENCOUNTER — Encounter: Payer: Self-pay | Admitting: Physical Therapy

## 2024-07-03 DIAGNOSIS — M6281 Muscle weakness (generalized): Secondary | ICD-10-CM

## 2024-07-03 DIAGNOSIS — M5459 Other low back pain: Secondary | ICD-10-CM

## 2024-07-03 DIAGNOSIS — R262 Difficulty in walking, not elsewhere classified: Secondary | ICD-10-CM
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
## 2024-07-16 ENCOUNTER — Other Ambulatory Visit: Payer: Self-pay | Admitting: Internal Medicine

## 2024-07-16 ENCOUNTER — Ambulatory Visit
Admission: RE | Admit: 2024-07-16 | Discharge: 2024-07-16 | Disposition: A | Discharge status: Home / Self Care | Source: Ambulatory Visit | Attending: Internal Medicine | Admitting: Internal Medicine

## 2024-07-16 DIAGNOSIS — Z1231 Encounter for screening mammogram for malignant neoplasm of breast: Secondary | ICD-10-CM
